# Patient Record
Sex: Female | Born: 1935
Health system: Southern US, Community
[De-identification: ages and names within clinical notes are randomized; demographics above are authoritative.]

## PROBLEM LIST (undated history)

## (undated) DIAGNOSIS — H9190 Unspecified hearing loss, unspecified ear: Secondary | ICD-10-CM

## (undated) DIAGNOSIS — E871 Hypo-osmolality and hyponatremia: Secondary | ICD-10-CM

## (undated) DIAGNOSIS — I480 Paroxysmal atrial fibrillation: Secondary | ICD-10-CM

## (undated) DIAGNOSIS — I10 Essential (primary) hypertension: Secondary | ICD-10-CM

## (undated) DIAGNOSIS — R0601 Orthopnea: Secondary | ICD-10-CM

## (undated) DIAGNOSIS — I340 Nonrheumatic mitral (valve) insufficiency: Secondary | ICD-10-CM

## (undated) DIAGNOSIS — K219 Gastro-esophageal reflux disease without esophagitis: Secondary | ICD-10-CM

## (undated) DIAGNOSIS — E785 Hyperlipidemia, unspecified: Secondary | ICD-10-CM

## (undated) DIAGNOSIS — C801 Malignant (primary) neoplasm, unspecified: Secondary | ICD-10-CM

## (undated) DIAGNOSIS — I7090 Unspecified atherosclerosis: Secondary | ICD-10-CM

## (undated) DIAGNOSIS — S42009A Fracture of unspecified part of unspecified clavicle, initial encounter for closed fracture: Secondary | ICD-10-CM

## (undated) DIAGNOSIS — R001 Bradycardia, unspecified: Secondary | ICD-10-CM

## (undated) DIAGNOSIS — J449 Chronic obstructive pulmonary disease, unspecified: Secondary | ICD-10-CM

## (undated) DIAGNOSIS — J9611 Chronic respiratory failure with hypoxia: Secondary | ICD-10-CM

## (undated) DIAGNOSIS — Z720 Tobacco use: Secondary | ICD-10-CM

## (undated) HISTORY — DX: Unspecified atherosclerosis: I70.90

## (undated) HISTORY — PX: ABDOMINAL HYSTERECTOMY: SHX81

## (undated) HISTORY — DX: Hyperlipidemia, unspecified: E78.5

## (undated) HISTORY — PX: APPENDECTOMY: SHX54

## (undated) HISTORY — PX: BACK SURGERY: SHX140

## (undated) HISTORY — DX: Essential (primary) hypertension: I10

## (undated) HISTORY — DX: Chronic obstructive pulmonary disease, unspecified: J44.9

## (undated) HISTORY — PX: VESICOVAGINAL FISTULA CLOSURE W/ TAH: SUR271

## (undated) HISTORY — DX: Tobacco use: Z72.0

## (undated) HISTORY — DX: Fracture of unspecified part of unspecified clavicle, initial encounter for closed fracture: S42.009A

## (undated) HISTORY — DX: Paroxysmal atrial fibrillation: I48.0

## (undated) HISTORY — DX: Gastro-esophageal reflux disease without esophagitis: K21.9

---

## 2003-01-22 ENCOUNTER — Ambulatory Visit: Admission: RE | Admit: 2003-01-22 | Discharge: 2003-01-22 | Payer: Self-pay | Admitting: Gynecologic Oncology

## 2003-01-22 ENCOUNTER — Encounter (INDEPENDENT_AMBULATORY_CARE_PROVIDER_SITE_OTHER): Payer: Self-pay | Admitting: Specialist

## 2003-02-27 ENCOUNTER — Encounter: Payer: Self-pay | Admitting: Gynecologic Oncology

## 2003-03-05 ENCOUNTER — Encounter (INDEPENDENT_AMBULATORY_CARE_PROVIDER_SITE_OTHER): Payer: Self-pay | Admitting: Specialist

## 2003-03-05 ENCOUNTER — Ambulatory Visit (HOSPITAL_COMMUNITY): Admission: RE | Admit: 2003-03-05 | Discharge: 2003-03-05 | Payer: Self-pay | Admitting: Gynecologic Oncology

## 2003-04-08 ENCOUNTER — Ambulatory Visit: Admission: RE | Admit: 2003-04-08 | Discharge: 2003-04-08 | Payer: Self-pay | Admitting: Gynecologic Oncology

## 2003-08-13 ENCOUNTER — Ambulatory Visit: Admission: RE | Admit: 2003-08-13 | Discharge: 2003-08-13 | Payer: Self-pay | Admitting: Gynecologic Oncology

## 2003-08-13 ENCOUNTER — Encounter (INDEPENDENT_AMBULATORY_CARE_PROVIDER_SITE_OTHER): Payer: Self-pay | Admitting: Specialist

## 2003-08-13 ENCOUNTER — Other Ambulatory Visit: Admission: RE | Admit: 2003-08-13 | Discharge: 2003-08-13 | Payer: Self-pay | Admitting: Gynecologic Oncology

## 2004-03-03 ENCOUNTER — Ambulatory Visit: Admission: RE | Admit: 2004-03-03 | Discharge: 2004-03-03 | Payer: Self-pay | Admitting: Gynecologic Oncology

## 2004-03-03 ENCOUNTER — Encounter (INDEPENDENT_AMBULATORY_CARE_PROVIDER_SITE_OTHER): Payer: Self-pay | Admitting: Specialist

## 2004-09-08 ENCOUNTER — Encounter (INDEPENDENT_AMBULATORY_CARE_PROVIDER_SITE_OTHER): Payer: Self-pay | Admitting: Specialist

## 2004-09-08 ENCOUNTER — Other Ambulatory Visit: Admission: RE | Admit: 2004-09-08 | Discharge: 2004-09-08 | Payer: Self-pay | Admitting: Gynecologic Oncology

## 2004-09-08 ENCOUNTER — Ambulatory Visit: Admission: RE | Admit: 2004-09-08 | Discharge: 2004-09-08 | Payer: Self-pay | Admitting: Gynecologic Oncology

## 2005-09-20 ENCOUNTER — Other Ambulatory Visit: Admission: RE | Admit: 2005-09-20 | Discharge: 2005-09-20 | Payer: Self-pay | Admitting: Gynecologic Oncology

## 2005-09-20 ENCOUNTER — Encounter (INDEPENDENT_AMBULATORY_CARE_PROVIDER_SITE_OTHER): Payer: Self-pay | Admitting: *Deleted

## 2005-09-20 ENCOUNTER — Ambulatory Visit: Admission: RE | Admit: 2005-09-20 | Discharge: 2005-09-20 | Payer: Self-pay | Admitting: Gynecologic Oncology

## 2006-11-08 ENCOUNTER — Ambulatory Visit: Admission: RE | Admit: 2006-11-08 | Discharge: 2006-11-08 | Payer: Self-pay | Admitting: Gynecologic Oncology

## 2006-11-08 ENCOUNTER — Other Ambulatory Visit: Admission: RE | Admit: 2006-11-08 | Discharge: 2006-11-08 | Payer: Self-pay | Admitting: Gynecologic Oncology

## 2007-02-06 ENCOUNTER — Ambulatory Visit: Payer: Self-pay | Admitting: Physician Assistant

## 2007-02-15 ENCOUNTER — Ambulatory Visit: Payer: Self-pay | Admitting: Unknown Physician Specialty

## 2007-02-15 ENCOUNTER — Other Ambulatory Visit: Payer: Self-pay

## 2007-02-19 ENCOUNTER — Inpatient Hospital Stay: Payer: Self-pay | Admitting: Unknown Physician Specialty

## 2007-02-22 ENCOUNTER — Encounter: Payer: Self-pay | Admitting: Internal Medicine

## 2007-03-20 ENCOUNTER — Encounter: Payer: Self-pay | Admitting: Internal Medicine

## 2007-04-19 ENCOUNTER — Encounter: Payer: Self-pay | Admitting: Internal Medicine

## 2007-05-20 ENCOUNTER — Encounter: Payer: Self-pay | Admitting: Internal Medicine

## 2007-06-19 ENCOUNTER — Encounter: Payer: Self-pay | Admitting: Internal Medicine

## 2007-07-20 ENCOUNTER — Encounter: Payer: Self-pay | Admitting: Internal Medicine

## 2007-08-20 ENCOUNTER — Encounter: Payer: Self-pay | Admitting: Internal Medicine

## 2007-09-19 ENCOUNTER — Encounter: Payer: Self-pay | Admitting: Internal Medicine

## 2007-10-20 ENCOUNTER — Encounter: Payer: Self-pay | Admitting: Internal Medicine

## 2007-11-19 ENCOUNTER — Encounter: Payer: Self-pay | Admitting: Internal Medicine

## 2008-05-04 IMAGING — CR DG CHEST 2V
1 series · 2 of 2 positions shown · non-contrast
Comparison: none

REASON FOR EXAM: COPD, hypertension
COMMENTS:

PROCEDURE:     DXR - DXR CHEST PA (OR AP) AND LATERAL  - February 15, 2007  [DATE]
RESULT:     The lung fields are clear. The heart, mediastinal and osseous
structures reveal no significant abnormalities.

[Series 1: view not recorded · 0.17mm/px · 2 of 2 slices shown]
[im 1/2]
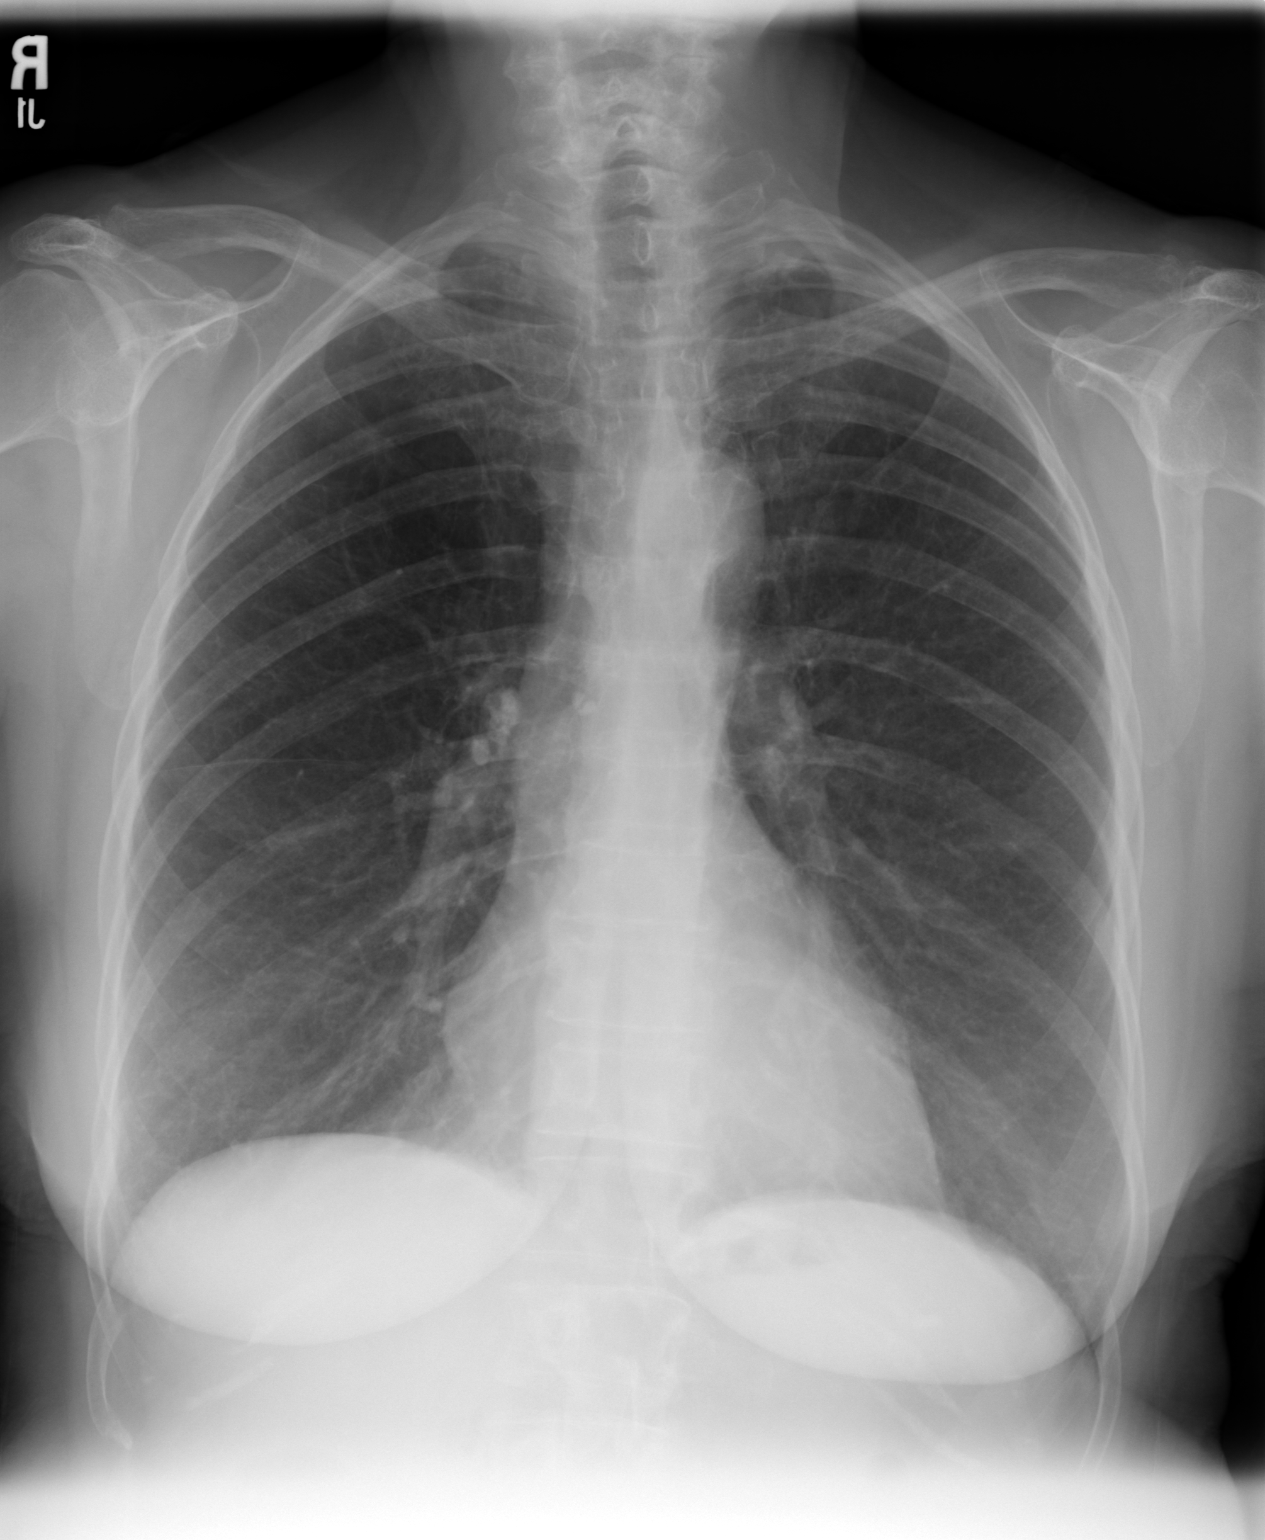
[im 2/2]
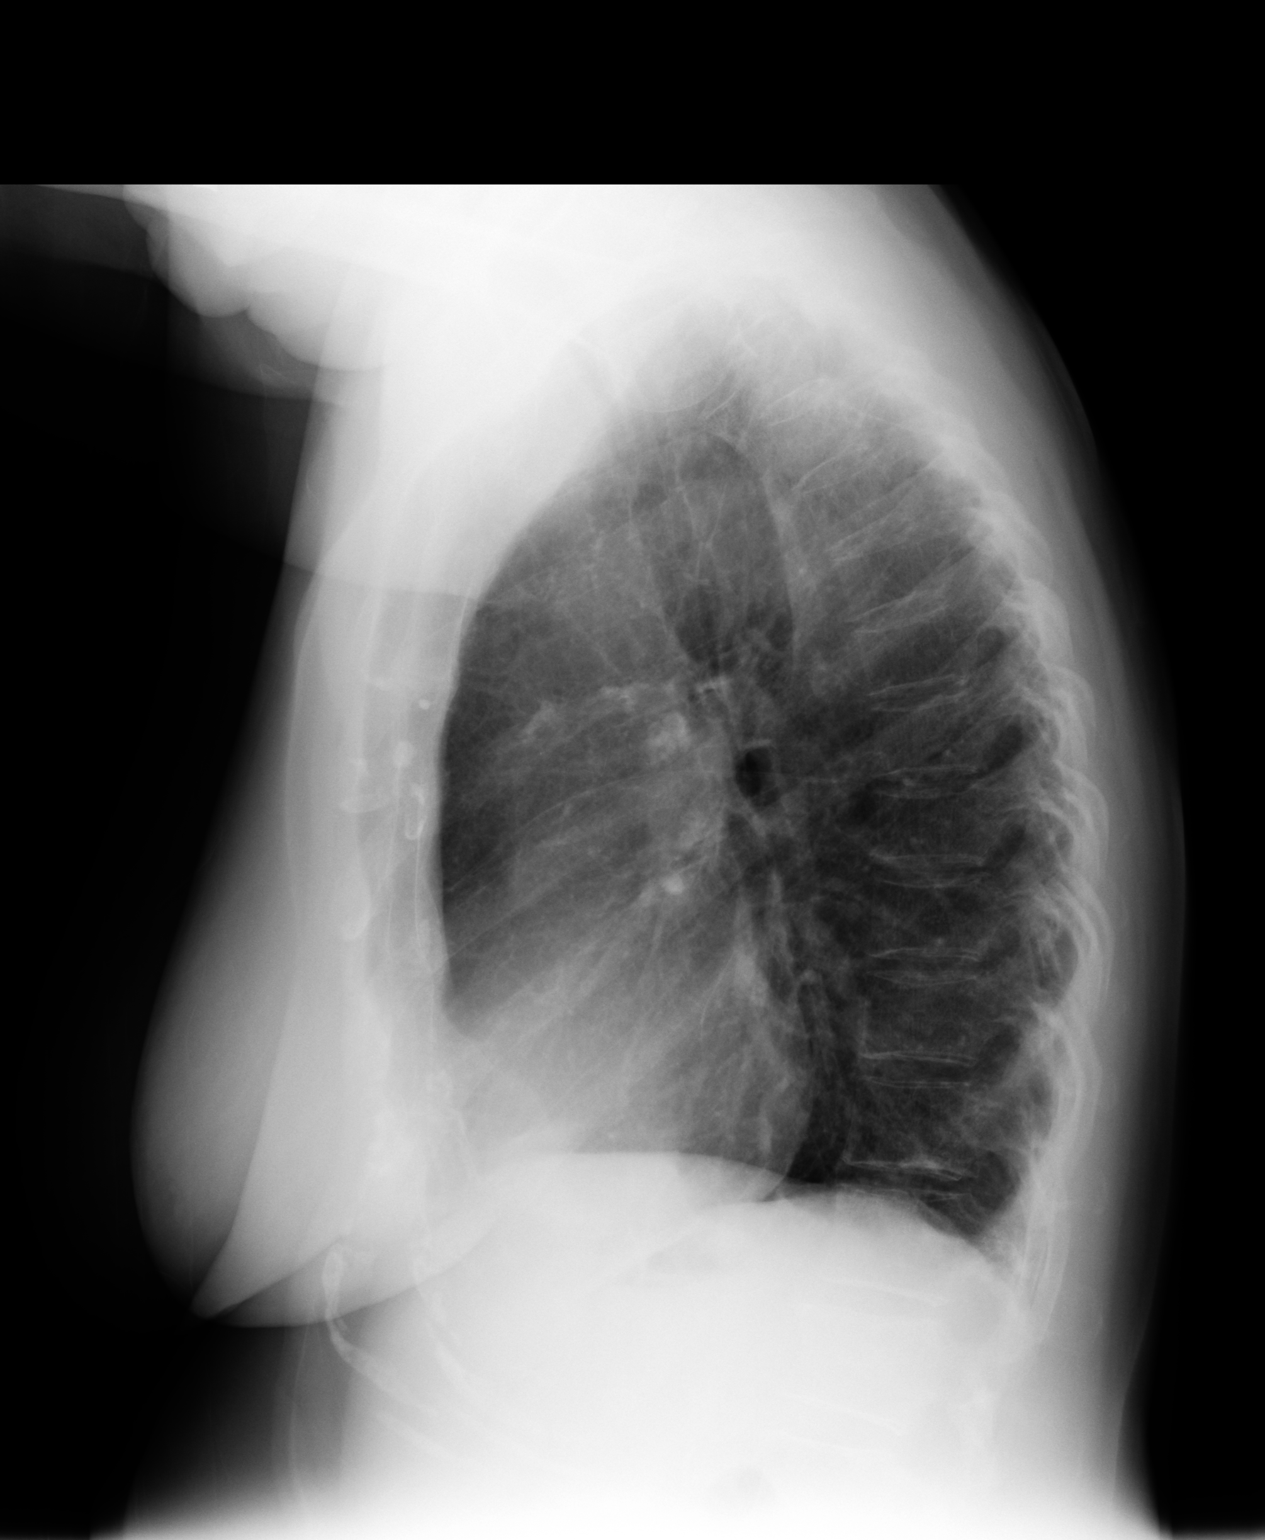

[2 of 2 positions shown; findings below may reference images not displayed]

IMPRESSION: No significant abnormalities are noted.

## 2008-09-15 ENCOUNTER — Encounter: Payer: Self-pay | Admitting: Cardiovascular Disease

## 2008-09-24 ENCOUNTER — Ambulatory Visit: Payer: Self-pay

## 2008-09-24 ENCOUNTER — Ambulatory Visit: Payer: Self-pay | Admitting: Cardiovascular Disease

## 2008-09-24 ENCOUNTER — Encounter: Payer: Self-pay | Admitting: Cardiovascular Disease

## 2011-05-03 NOTE — Assessment & Plan Note (Signed)
[facility]                            [place] OFFICE NOTE   NAME:[name], Jenna SPROWL                       MRN:          [id number]  DATE:09/24/2008                            DOB:          [birth year]    HISTORY OF PRESENT ILLNESS:  [name] is a pleasant 77 year old  Caucasian female with a past medical history significant for  hypertension, hyperlipidemia, COPD, gastroesophageal reflux disease, and  tobacco abuse who presents to our cardiology office today for cardiac  assessment following abnormal EKG.  The patient tells me that she has  been in her normal state of good health.  She denies any recent chest  pain, palpitations, diaphoresis, nausea, vomiting, dizziness, near-  syncope, syncope, paroxysmal nocturnal dyspnea, orthopnea, or lower  extremity edema.  She is very active around her house, doing all of her  housework, cooking, and all of her yard work.  She is never limited by  chest pain or shortness of breath.  She does occasionally use an  albuterol nebulizer for her COPD.  She does not require home oxygen  therapy for her COPD.  She also enjoys playing golf and states that she  can do anything she wants without being limited by symptoms of chest  pain or shortness of breath.  She was seen in Dr. Marliss Coots office last  week and had an 12-lead EKG that showed sinus bradycardia with a  ventricular rate of 50 beats per minute as well as low voltage QRS  complexes.  She is referred here today for further evaluation of her  abnormal EKG.   PAST MEDICAL HISTORY:  1. Hypertension.  2. Hyperlipidemia.  3. Gastroesophageal reflux disease.  4. COPD.  5. Osteoporosis.  6. Tobacco abuse.   PAST SURGICAL HISTORY:  1. Hysterectomy.  2. Appendectomy.  3. Back surgery x2.   ALLERGIES:  SULFA, PENICILLIN.   CURRENT MEDICATIONS:  1. Pravastatin 40 mg once daily.  2. Coreg 25 mg twice daily.  3. Omeprazole 20 mg once daily.  4.  Lisinopril/hydrochlorothiazide 10/25 mg once daily.  5. Enteric-coated aspirin 81 mg once daily.  6. Calcium 600 mg 1 tablet once daily.   SOCIAL HISTORY:  The patient has smoked 1 pack of cigarettes per day for  the last 50+ years.  She continues to smoke one-pack daily.  She denies  use of alcohol or illicit drugs.  She is widowed and has 5 children.   FAMILY HISTORY:  The patient's mother died from natural causes at the  age of 7.  Her father died at age of 46 from suicide.  She has 4  sisters, 1 of whom died at the age of 90 from a myocardial infarction.  She has a brother who has cancer.   REVIEW OF SYSTEMS:  As stated in the history of present illness and is  otherwise negative.   PHYSICAL EXAMINATION:  GENERAL:  She is a pleasant elderly Caucasian  female in no acute distress.  VITAL SIGNS:  Blood pressure 144/82, pulse 60 and regular, respiration  12 and nonlabored.  NECK:  No JVD.  No carotid bruits.  No thyromegaly, no lymphadenopathy.  SKIN:  Warm and dry.  Oropharynx clear.  Mucous membranes moist.  NEUROLOGIC:  No focal deficits.  Cranial nerves II through XII are  grossly intact.  LUNGS:  Clear to auscultation bilaterally with slightly decreased air  movement in both lung fields.  No wheezes, crackles, or rhonchi noted.  CARDIOVASCULAR:  Bradycardic with regular rhythm and no murmurs,  gallops, or rubs noted.  There are distant heart sounds.  ABDOMEN:  Soft, nontender.  Bowel sounds are present.  EXTREMITIES:  No evidence of lower extremity edema.  Pulses are 2+ in  all extremities.  The patient does have a brace on her right lower  extremity secondary to foot drop from a prior surgery.   DIAGNOSTIC STUDIES:  1. A 12-lead electrocardiogram obtained on September 15, 2008, and Dr.      Marliss Coots office shows sinus bradycardia with a ventricular rate of      50 beats per minute and low voltage QRS complexes.  There are no      ischemic changes noted on this EKG.  2. A  12-lead EKG obtained in our office today shows sinus bradycardia      with a ventricular rate of 56 beats per minute, nonspecific      intraventricular conduction delay, and normal QRS complexes with      normal R-wave progression throughout the precordial leads.  There      are no ischemic changes noted on this EKG.  All intervals are      within normal limits.   ASSESSMENT AND PLAN:  This is a pleasant 77 year old Caucasian female  with a past medical history significant for hypertension,  hyperlipidemia, tobacco abuse, COPD, and gastroesophageal reflux disease  who has been asymptomatic and presents today for further evaluation of  slightly abnormal EKG.  I think it would be reasonable at this time to  perform a surface echocardiogram to rule out a pericardial effusion or  any other cardiac abnormalities.  Her low-voltage EKG is most likely  related to her COPD.  She describes no symptoms today that are  suggestive of angina, arrhythmias, or congestive heart failure.  I would  like to see her back in my office in several weeks to review the results  of her echocardiogram.  I have  encouraged her to alert our office should she have any change in her  symptoms in the meantime.     Verne Carrow, MD  Electronically Signed    CM/MedQ  DD: 09/24/2008  DT: 09/24/2008  Job #: [id number]   cc:   Gelene Mink, MD

## 2011-05-06 NOTE — Consult Note (Signed)
   NAME:  [name], [name]                    ACCOUNT NO.:  [id number]   MEDICAL RECORD NO.:  [id number]                   PATIENT TYPE:  OUT   LOCATION:  GYN                                  FACILITY:  [facility]   PHYSICIAN:  John T. Kyla Balzarine, M.D.                 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   HISTORY OF PRESENT ILLNESS:  The patient underwent wide local excision of  left labial VIN III and laser vaporization of the upper vaginal cuff in  March 2004.  She has healed quite well and notes no new vulvar lesions,  vulvar pruritus, or discharge.   Past history, personal social history, family history, and review of systems  are otherwise unchanged from those recorded during her surgical history and  examination for her March 17 procedure.   PHYSICAL EXAMINATION:  VITAL SIGNS:  Stable and afebrile with weight 149  pounds, blood pressure 136/84.  ABDOMEN:  Soft and benign without ascites, mass, or organomegaly.  PELVIC:  External genitalia and BUS reveal good healing of her surgical  incision.  There are no active lesions suggesting dysplasia.  Speculum  examination reveals good pelvic support with no mucosal lesion and complete  healing of the vaginal cuff.  Bladder and urethra are well supported.  Cervix and uterus are surgically absent.  Bimanual and rectovaginal  examinations reveal no adnexal or parametrial mass.   ASSESSMENT:  VIN and VAIN.   PLAN:  The patient reassured regarding current status.  At this juncture I  believe that we can follow her at six month intervals with cytology and we  can alternate with Thalia Party, M.D. in [place] to spare her some  travel.                                               John T. Kyla Balzarine, M.D.    JTS/MEDQ  D:  08/13/2003  T:  08/13/2003  Job:   [id number]   cc:   Thalia Party  [address]  [place]  [place] [address]  Fax: [phone]   Telford Nab, R.N.  [address]  [place], [place] [address]

## 2011-05-06 NOTE — Op Note (Signed)
NAME:  [name], [name]                    ACCOUNT NO.:  [id number]   MEDICAL RECORD NO.:  [id number]                   PATIENT TYPE:  AMB   LOCATION:  DAY                                  FACILITY:  [facility]   PHYSICIAN:  John T. Kyla Balzarine, M.D.                 DATE OF BIRTH:  31-Dec-1932   DATE OF PROCEDURE:  03/05/2003  DATE OF DISCHARGE:                                 OPERATIVE REPORT   PREOPERATIVE DIAGNOSIS:  Vulvar carcinoma in situ, vaginal intraepithelial  neoplasia.   POSTOPERATIVE DIAGNOSIS:  Vulvar carcinoma in situ, vaginal intraepithelial  neoplasia.   PROCEDURE:  Wide local excision of left labial lesion, laser vaporization of  upper vaginal cuff.   SURGEON:  John T. Kyla Balzarine, M.D.   ANESTHESIA:  General supplemented with local infiltration of 0.25% Marcaine.   INDICATIONS:  This 77 year old woman presented with left intralabial  carcinoma in situ and a VIN-II involving the vaginal cuff.  After  consideration of options, it was elected to remove the intralabial lesion to  make sure that the patient did not have an invasive lesion at that site.   FINDINGS:  At the time of exam under anesthesia, the patient was found to  have 2 x 2.5 cm left anterior intralabial lesion, which was accentuated with  acetic acid staining, appeared relatively thin, and had no ulceration.  This  was resected with 5 mm margins, with the resection extending into the  superficial fat to ensure adequate margins if the cold invasion were  identified.  The upper vaginal cuff was vaporized to a depth of 1-2 mm after  infiltrating the upper cuff and lesion with Marcaine to float the mucosa.  This provided at least 5 mm margins around the aceto-white epithelium.   DESCRIPTION OF PROCEDURE:  The patient was prepped and draped in the  lithotomy position and bladder emptied with a straight catheter.  The left  intralabial fold lesion was injected subdermally with Marcaine and  circumscribed in an  elliptical fashion with the scalpel.  The medial margin  of resection was adjacent to the clitoris and at the base of the left labia  and anterior labia minora and laterally just into the medial labia majora.  The lesion was excised using scalpel with electrocautery for hemostasis,  sizing a thin rim of underlying fat to provide adequate margins if an  invasive lesion were encountered.  Hemostasis was ensured with  electrocautery and the defect closed in layers with interrupted 2-0 Vicryl  for subcutaneous adipose tissues and running 3-0 Monocryl for the skin.   The CO2 laser was hooked up to the culdoscope.  A speculum with smoke  evacuator was advanced into the vagina, and there was good visualization of  approximately a 2 cm patch of white epithelium in the mid cuff.  This was  slightly closer to the right vaginal dimple but did not go up into either of  the  vaginal dimples.  The lesion was injected submucosally with Marcaine to  float the lesion and vaginal mucosa.  Using the CO2 laser at a continuous  setting of 20 watts, the lesion was vaporized to a depth of approximately 1  mm.  Adjacent epithelium was likewise treated to a depth of approximately 1-  2 mm, essentially vaporizing the mucosa with a 5 mm margin around the gross  lesion.  There was minimal bleeding, and hemostasis was adequate.  The  patient was returned to the recovery room in stable condition.  Estimated  blood loss was nil.  Transfusions none.  Drains and packs none.  Sponge and  instrument counts.  Pathology:  Left labial lesion.                                               John T. Kyla Balzarine, M.D.    JTS/MEDQ  D:  03/05/2003  T:  03/06/2003  Job:  [id number]   cc:   Thalia Party  [address]  [place]  [place] [address]  Fax: [phone]   Telford Nab, R.N.

## 2011-05-06 NOTE — Consult Note (Signed)
[name], [name]                ACCOUNT NO.:  [id number]   MEDICAL RECORD NO.:  [id number]          PATIENT TYPE:  OUT   LOCATION:  GYN                          FACILITY:  [facility]   PHYSICIAN:  John T. Kyla Balzarine, M.D.    DATE OF BIRTH:  March 18, 1933   DATE OF CONSULTATION:  11/08/2006  DATE OF DISCHARGE:                                 CONSULTATION   CHIEF COMPLAINT:  Follow up of low grade vaginal dysplasia.   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  She underwent wide local excision of the left vulvar  carcinoma in situ and laser vaporization of the upper vaginal lesion.  She has had intermittent cytology suggesting a low grade SIL, and we  recommended vaginal Premarin but has not been using this recently.  Pap  smear in October 2006 and March 2007 were negative for dysplasia.  The  patient denies pelvic symptoms or vaginal bleeding.   CURRENT MEDICATIONS:  Evista, Proventil, Mobic, Aciphex   ALLERGIES:  PENICILLIN and SULFA.   PAST MEDICAL, SURGICAL, PERSONAL, SOCIAL, AND FAMILY HISTORIES:  Are  reviewed none and essentially unchanged from those obtained during my  intake evaluation in February 2004.  The patient does continue to smoke.   REVIEW OF SYSTEMS:  Other than above, negative for systemic, GI, GU for  heme/lymph systems.   PHYSICAL EXAMINATION:  VITAL SIGNS:  Stable with weight 138 pounds  (stable)  LYMPHATIC SURVEY: With no pathologic lymphadenopathy.  ABDOMEN:  Soft and benign without hernia, ascites, mass or tenderness.  EXTREMITIES:  Full range of motion without edema.  PELVIC:  External genitalia and BUS are normal.  Inspection and  palpation without condylomatous or leukoplakia.  The left partial  vulvectomy scar is well healed.  The vagina is atrophic with good  support of the bladder and rectum.  Bimanual and rectovaginal  examinations reveal  absent uterus and cervix with no adnexal or  parametrium mass or nodularity.   ASSESSMENT:  1. Persistent abnormal vaginal cytology in the past, resolved.  2. History of multifocal lower genital tract dysplasia.   PLAN:  The patient's cytology has been without evidence of dysplasia  over the last 18 months, and it has been more than 3 years since the  initial treatment.  I believe that she could be followed with annual  cytology obtained by Dr. Lacie Scotts; I would be glad to see her at any  time on a p.r.n. basis.      John T. Kyla Balzarine, M.D.  Electronically Signed     JTS/MEDQ  D:  11/08/2006  T:  11/08/2006  Job:  [id number]   cc:   Telford Nab, R.N.  [address]  [place], [place] [address]   Evelene Croon  Fax: [phone]   Phil D. Okey Dupre, M.D.

## 2011-05-06 NOTE — Consult Note (Signed)
NAME:  [name], [name]                    ACCOUNT NO.:  [id number]   MEDICAL RECORD NO.:  [id number]                   PATIENT TYPE:  OUT   LOCATION:  GYN                                  FACILITY:  [facility]   PHYSICIAN:  John T. Kyla Balzarine, M.D.                 DATE OF BIRTH:  08/17/1933   DATE OF CONSULTATION:  03/03/2004  DATE OF DISCHARGE:                                   CONSULTATION   CHIEF COMPLAINT:  Follow up of recurrent VAIN.   HISTORY OF PRESENT ILLNESS:  The patient initially presented with left  labial VIN-3 and upper vaginal moderate dysplasia diagnosed by Dr. Luella Cook  in early 2004.  On March 05, 2003, she underwent wide local excision of left  vulvar carcinoma in situ and laser vaporization of the upper vaginal lesion.  Follow up cytology recently by Dr. Luella Cook was compatible with low-grade  SIL.  There apparently was a small amount of blood at the cuff noted grossly  by Dr. Luella Cook.  She returns for further evaluation.  On questioning, the  patient denies pelvic pain or vaginal bleeding.  She is not currently using  any estrogen.   PAST MEDICAL HISTORY:  1. Osteoarthritis.  2. Osteoporosis.  3. Hypercholesterolemia.  4. She had a prior hysterectomy with USO in 1976 for fibroids.   MEDICATIONS:  Included Evista, Provencal, Mobic, and Aciphex.   ALLERGIES:  PENICILLIN and SULFA.   Otherwise Personal Social History, Family History and Review of Systems are  unchanged from those obtained in my initial evaluation in February 2004.  The patient continues to smoke.   PHYSICAL EXAMINATION:  VITAL SIGNS:  Vital signs stable with weight 147  pounds and blood pressure 140/96.  GENERAL:  The patient is alert and oriented x3 in no acute distress.  LYMPHATICS:  No pathologic lymphadenopathy.  ABDOMEN:  Soft and benign without hernia, ascites, mass, or tenderness.  EXTREMITIES:  Full range of motion without edema.  PELVIC:  External genital and BUS are normal to  inspection and palpation,  without leukoplakic lesions.  The prior left partial vulvectomy scar is well  healed.  The vagina is atrophic but there is good support of rectum and  bladder.  No gross lesions are seen and there is no evidence of bleeding.  Bimanual and rectovaginal examinations reveal absent uterus and cervix.  No  adnexal or parametrial mass or nodularity.   PROCEDURE NOTE:  After verbal informed consent was obtained, I performed  colposcopy of the entire vaginal canal.  The patient's left forniceal  dimple has been obliterated.  Arising from the right dimple is a 5 mm  acetowhite lesion with features suggesting flat condyloma with punctation  but no atypical vascularity or mosaicism.  Biopsy was obtained without  incident.   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  Prior history of multifocal lower genital tract  dysplasia.   RECOMMENDATIONS:  If today's biopsy returns compatible with VAIN-1 or  condylomata, I would recommend that the patient simply use vaginal Premarin  once weekly and return to see [place] in 6 months for follow up cytology.  If she  has stable low-grade SIL cytology, she could be followed by Dr. Luella Cook with  cytology at 6 month intervals.  I would repeat colposcopy then for worsening  grade of cytologic abnormality or symptoms.  On the other hand, if the  patient has VAIN-2 or a higher grade lesion, I would recommend upper  colpectomy to excise the forniceal dimple and adjacent mucosa.                                               John T. Kyla Balzarine, M.D.    JTS/MEDQ  D:  03/03/2004  T:  03/05/2004  Job:  [id number]   cc:   Telford Nab, R.N.  [address]  [place], [place] [address]   Thalia Party  [address]  [place]  [place] [address]  Fax: [phone]

## 2011-05-06 NOTE — Consult Note (Signed)
   NAME:  [name], [name]                    ACCOUNT NO.:  [id number]   MEDICAL RECORD NO.:  [id number]                   PATIENT TYPE:  OUT   LOCATION:  GYN                                  FACILITY:  [facility]   PHYSICIAN:  John T. Kyla Balzarine, M.D.                 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   HISTORY OF PRESENT DISEASE:  This patient underwent wide local excision of  the left labial lesion and laser vaporization of the upper vaginal cuff on  March 05, 2003.  She has had cessation of vaginal discharge and her vulvar  lesion has healed well.  Final pathology revealed squamous carcinoma in situ  of the vulva with focally positive margins.   Past history, personal social history, family history, and review of systems  are otherwise unchanged from those recorded during her surgical history and  examination for her March 17 procedure.   PHYSICAL EXAMINATION:  VITAL SIGNS:  Blood pressure 120/70.  PELVIC:  External genitalia and BUS revealed good healing of her surgical  incision.  There are no active lesions suggesting dysplasia.  Speculum  examination reveals complete healing of the vaginal cuff.  Bladder and  urethra well supported.  Cervix and uterus are surgically absent.  Bimanual  and rectovaginal examinations reveal no adnexal or parametrial mass.   ASSESSMENT:  VIN and VAIN.   PLAN:  The patient reassured regarding current status.  I would like to see  her back for follow-up in four to six months and then she should be followed  with q.6 month cytology for several years and at least annual cytology for  lifetime.  To spare her travel she could be followed in the future by  Thalia Party, M.D. after we have documented that we have cleared her  dysplastic process.        John T. Kyla Balzarine, M.D.    JTS/MEDQ  D:  04/08/2003  T:  04/08/2003  Job:  [id number]   cc:   Thalia Party  [address]  [place]  [place] [address]  Fax: [phone]   Telford Nab, R.N.

## 2011-05-06 NOTE — Consult Note (Signed)
NAMECARLISA, [name]                ACCOUNT NO.:  [id number]   MEDICAL RECORD NO.:  [id number]          PATIENT TYPE:  OUT   LOCATION:  GYN                          FACILITY:  [facility]   PHYSICIAN:  John T. Kyla Balzarine, M.D.    DATE OF BIRTH:  06-03-1933   DATE OF CONSULTATION:  09/08/2004  DATE OF DISCHARGE:                                   CONSULTATION   CHIEF COMPLAINT:  Follow up of low-grade vaginal dysplasia.   INTERVAL HISTORY:  Vaginal cuff biopsy via colposcopic direction on  March 03, 2004, revealed slight squamous dysplasia. She has been using vaginal  Premarin every week and relates no new lesions or symptoms.   HISTORY OF PRESENT ILLNESS:  The patient initially presented with a left  labial VIM-III and upper vaginal moderate dysplasia in early 2004. In March  2004 she underwent wide local excision of left bulbar carcinoma in situ and  laser vaporization of the upper vaginal lesion. Follow-up cytology was  compatible with low-grade SIL with slight dysplasia of the vaginal cuff  diagnosed in March 2005. We elected treatment with estrogen without  intervention.   PAST MEDICAL HISTORY:  Osteoarthritis, osteoporosis, hypercholesterolemia.   PAST SURGICAL HISTORY:  Prior hysterectomy with USO in 1976 for fibroids.   MEDICATIONS:  Evista, Proventil, Mobic, Aciphex, and vaginal Premarin.   ALLERGIES:  PENICILLIN and SULFA.   PERSONAL/SOCIAL HISTORY, FAMILY HISTORY, REVIEW OF SYSTEMS:  Unchanged from  those obtained on my initial intake evaluation in February 2004. The patient  continues to smoke.   PHYSICAL EXAMINATION:  VITAL SIGNS: Weight 149 pounds, blood pressure  150/88, and the patient is afebrile.  GENERAL: The patient is alert and oriented times three, and in no acute  distress.  LYMPHS: No pathology lymphadenopathy.  ABDOMEN: Soft and benign without hernia, ascites, masses, or tenderness.  EXTREMITIES: Full range of motion without edema.  PELVIC EXAM: External  genitalia and BUS are normal to inspection and  palpation without condylomatous or leukoplakic lesions. Left partial  vulvectomy scar is well healed. Vagina is atrophic with good support of  bladder and rectum. Bimanual and rectovaginal examinations reveal absent  uterus and cervix. No adnexal or parametrial mass/nodularity.   ASSESSMENT:  Persistent low-grade vaginal intraepithelial neoplasia. Prior  multifocal lower genital tract dysplasia.   PLAN:  The patient will continue Premarin. Pap smear obtained: If low-grade  SIL changes only, the patient can be followed by Dr. Luella Cook at six month  intervals. I would certainly be glade to see her back at any time if there  were concerns regarding the degree of dysplasia.     John   JTS/MEDQ  D:  09/08/2004  T:  09/09/2004  Job:  [id number]   cc:   Telford Nab, R.N.  [address]  [place], [place] [address]   Thalia Party  [address]  [place]  [place] [address]  Fax: [phone]

## 2011-05-06 NOTE — Consult Note (Signed)
NAME:  [name], [name]                    ACCOUNT NO.:  [id number]   MEDICAL RECORD NO.:  [id number]                   PATIENT TYPE:  OUT   LOCATION:  GYN                                  FACILITY:  [facility]   PHYSICIAN:  John T. Kyla Balzarine, M.D.                 DATE OF BIRTH:  [birth year]   DATE OF CONSULTATION:  01/22/2003  DATE OF DISCHARGE:                                   CONSULTATION   CHIEF COMPLAINT:  This 77 year old woman is seen at the request of Dr.  Patton Salles for evaluation of vaginal dysplasia.   HISTORY OF PRESENT ILLNESS:  The patient was noted to have a white lesion of  the upper vagina by her family physician, and was referred to Dr. Patton Salles  for evaluation.  She has apparently had frequent cytology and denies  abnormalities.  The patient was thoroughly evaluated by Dr. Patton Salles,  including colposcopically directed biopsy of upper vaginal lesion which  returned positive for [facility] with HPD changes.  Slides were reviewed at  [facility], and moderate dysplasia was thought to be a focal  change.  The patient denies prior history of vaginal or vulvar condylomata  or STD's.   PAST MEDICAL HISTORY:  1. Osteoarthritis.  2. Osteoporosis.  3. Hypercholesterolemia.  4. Status post total abdominal hysterectomy with unilateral salpingo-     oophorectomy in 1976, for fibroids and dysfunctional bleeding.   MEDICATIONS:  1. Evista.  2. Provencal.  3. Mobic.  4. Aciphex.   ALLERGIES:  1. PENICILLIN.  2. SULFA.   SOCIAL HISTORY:  The patient admits one pack per day tobacco use for more  then 40 years.  Denies significant ethanol use.   FAMILY HISTORY:  Significant for adult onset diabetes, and a sister with  breast cancer.   REVIEW OF SYMPTOMS:  The patient denies systemic symptoms such as weight  loss or anorexia.  She denies cardiopulmonary symptoms such as dyspnea or  chest pain, but does note a smoker's cough.  GASTROINTESTINAL:  Gastroesophageal reflux  disease, mild to moderate.  Denies change in bowel  or bladder function.  GENITOURINARY:  Denies hematuria, frequent urinary  tract infections, urgency, or urinary loss.  OB/GYN:  As per HPI.   PHYSICAL EXAMINATION:  VITAL SIGNS:  Weight 155 pounds, blood pressure  160/90, respirations 22, afebrile.  GENERAL:  The patient is anxious, alert and oriented x3, in no acute  distress.  HEENT:  Benign with clear oropharynx.  LUNGS:  Lung fields are clear.  HEART:  Regular rhythm without JVD.  ABDOMEN:  Soft and benign with well-healed incision without hernia, ascites,  mass, or organomegaly.  EXTREMITIES:  Full strength and range of motion without edema.  PELVIC:  External genitalia are significant for a 1.5 x 2 cm white lesion in  the anterior left intralabial fold, adjacent to the cervix.  There is a  slightly raised border.  No other pigmented lesions  are seen, including  following inspection of the vulva and perianal regions with acidic acid  staining.  The vagina has good supported bladder and urethra.  A  hyperkeratotic lesion is present in the right fornix.  Healing biopsy site  is grossly visible.  Cervix and uterus are surgically absent.  Bimanual and  rectovaginal examinations reveal no mucosal thickening, adnexal or  parametrial mass.  Rectal examination is confirmatory.   PROCEDURE NOTE:  After verbal informed consent was obtained, the leading  edge of the vulvar lesion was infiltrated with 1% Xylocaine and biopsied  with an Eppendorf.  Hemostasis was achieved with silver nitrate.  The  patient tolerated the procedure quite well.   ASSESSMENT:  1. Moderate vaginal dysplasia.  2. White vulvar lesion, concerning for vulvar dysplasia.   PLAN:  I had a long discussion with the patient regarding the nature of her  disease and the potential need for treatment.  If her vulvar biopsy returns  having non-specific change, I would simply treat her with vaginal Estrogen  and repeat an  evaluation in approximately three months.  On the other hand,  if she has significant vulvar dysplasia, she would undergo a day surgery  excision of the vulvar lesion combined with laser vaporization of the upper  vaginal lesion.  We discussed risks and benefits of this approach, and I  answered questions posed by the patient.  Surgery is tentatively scheduled  for 03/05/03, pending the results of biopsy.                                                John T. Kyla Balzarine, M.D.    JTS/MEDQ  D:  01/22/2003  T:  01/22/2003  Job:  [id number]   cc:   Ashley Akin, M.D.  [facility]  [address]  [place], [place]  [address]   Telford Nab, R.N.  [address] [place], [place] [address]  Fax: 1

## 2011-05-06 NOTE — Consult Note (Signed)
[name], [name]                ACCOUNT NO.:  [id number]   MEDICAL RECORD NO.:  [id number]          PATIENT TYPE:  OUT   LOCATION:  GYN                          FACILITY:  [facility]   PHYSICIAN:  John T. Kyla Balzarine, M.D.    DATE OF BIRTH:  04-06-1933   DATE OF CONSULTATION:  09/20/2005  DATE OF DISCHARGE:                                   CONSULTATION   CHIEF COMPLAINT:  Follow up with low-grade vaginal dysplasia.   INTERVAL HISTORY:  Vaginal cuff biopsy in May 2005 was negative for  significant dysplasia and a follow up Pap smear revealed only scattered  atypical cells of undetermined significance.  We recommended continued  follow up at six month intervals.   HISTORY OF PRESENT ILLNESS:  The patient presented with left labial vulvar  intraepithelial neoplasia VIN-3 and upper vaginal moderate dysplasia in  early 2004.  She underwent wide local excision of the left vulvar carcinoma  in situ and laser vaporization of the upper vaginal lesion.  She has had  intermittent cytology suggesting low-grade SIL.  She is not using vaginal  Premarin with any frequency.   PAST MEDICAL/SURGICAL/PERSONAL SOCIAL, FAMILY HISTORY:  Reviewed and  unchanged from those obtained in my intake evaluation in February 2004.  The  patient continues to smoke.   CURRENT MEDICATIONS:  1.  Evista.  2.  Proventil.  3.  Mobic.  4.  Aciphex.   ALLERGIES:  PENICILLIN, SULFA.   REVIEW OF SYSTEMS:  Other than above, comprehensive 10-point review of  systems negative.   PHYSICAL EXAMINATION:  ABDOMEN:  Soft and benign without hernia, ascites,  mass or tenderness.  EXTREMITIES:  Full range of motion without edema.  LYMPHATIC:  Survey with no pathological lymphadenopathy.  PELVIC:  External genitalia and BUS are normal with inspection and palpation  without condylomas or leukoplakic lesions.  The left partial vulvectomy scar  is well healed.  The vagina is atrophic with good supportive bladder and  rectum.   Bimanual and rectovaginal examination revealed adequate uterus and  cervix with no adnexal or parametrial mass/nodularity.   ASSESSMENT:  Persistent abnormal vaginal cytology.  Prior multifocal lower  genital tract dysplasia.   PLAN:  Pap smear obtained and will be communicated to the patient.  I did  recommend that she continue her vaginal Premarin.  Because of  transportation, the patient wishes cytology to be obtained in [facility] by Dr.  Lacie [name].  This could be performed by him or by Dr. Luella [name] in six months,  and we could see her back for follow up in one year.  I would not repeat  colposcopy unless she had changes of progressive dysplasia on cytology.      John T. Kyla Balzarine, M.D.  Electronically Signed     JTS/MEDQ  D:  09/20/2005  T:  09/21/2005  Job:  [id number]   cc:   Thalia Party  Fax: [phone]  Fax: [phone]   Telford Nab, R.N.  [address]  [place], [place] [address]

## 2011-06-03 ENCOUNTER — Encounter: Payer: Self-pay | Admitting: Cardiovascular Disease

## 2014-01-14 ENCOUNTER — Encounter: Payer: Self-pay | Admitting: Cardiology

## 2014-01-14 ENCOUNTER — Ambulatory Visit (INDEPENDENT_AMBULATORY_CARE_PROVIDER_SITE_OTHER): Payer: Medicare Other | Admitting: Cardiology

## 2014-01-14 VITALS — BP 152/82 | HR 47 | Ht 62.0 in | Wt 109.8 lb

## 2014-01-14 DIAGNOSIS — I10 Essential (primary) hypertension: Secondary | ICD-10-CM

## 2014-01-14 DIAGNOSIS — I498 Other specified cardiac arrhythmias: Secondary | ICD-10-CM

## 2014-01-14 DIAGNOSIS — E785 Hyperlipidemia, unspecified: Secondary | ICD-10-CM

## 2014-01-14 DIAGNOSIS — R0609 Other forms of dyspnea: Secondary | ICD-10-CM

## 2014-01-14 DIAGNOSIS — R0989 Other specified symptoms and signs involving the circulatory and respiratory systems: Secondary | ICD-10-CM

## 2014-01-14 DIAGNOSIS — R001 Bradycardia, unspecified: Secondary | ICD-10-CM

## 2014-01-14 DIAGNOSIS — R9431 Abnormal electrocardiogram [ECG] [EKG]: Secondary | ICD-10-CM

## 2014-01-14 NOTE — Patient Instructions (Addendum)
Your physician has requested that you have an echocardiogram. Echocardiography is a painless test that uses sound waves to create images of your heart. It provides your doctor with information about the size and shape of your heart and how well your heart's chambers and valves are working. This procedure takes approximately one hour. There are no restrictions for this procedure.  Your physician has recommended that you wear an event monitor. Event monitors are medical devices that record the heart's electrical activity. Doctors most often [place] these monitors to diagnose arrhythmias. Arrhythmias are problems with the speed or rhythm of the heartbeat. The monitor is a small, portable device. You can wear one while you do your normal daily activities. This is usually used to diagnose what is causing palpitations/syncope (passing out).  Once you receive the monitor, take your regular dose of carvedilol (25mg  twice a day) for 2 days, then cut in half (12.5 mg twice a day).  Your physician recommends that you schedule a follow-up appointment in: 3 weeks.

## 2014-01-17 ENCOUNTER — Encounter: Payer: Self-pay | Admitting: Cardiology

## 2014-01-17 DIAGNOSIS — E785 Hyperlipidemia, unspecified: Secondary | ICD-10-CM | POA: Insufficient documentation

## 2014-01-17 DIAGNOSIS — R9431 Abnormal electrocardiogram [ECG] [EKG]: Secondary | ICD-10-CM | POA: Insufficient documentation

## 2014-01-17 DIAGNOSIS — R0609 Other forms of dyspnea: Secondary | ICD-10-CM

## 2014-01-17 DIAGNOSIS — R001 Bradycardia, unspecified: Secondary | ICD-10-CM | POA: Insufficient documentation

## 2014-01-17 DIAGNOSIS — I10 Essential (primary) hypertension: Secondary | ICD-10-CM | POA: Insufficient documentation

## 2014-01-17 NOTE — Assessment & Plan Note (Signed)
Borderline 2 inadequate control of her blood pressures with current regimen. As on a cutting back to carvedilol we probably will need to change her regimen to add another antihypertensive agent. We can address this with you back in followup. Would anticipate using either an ACE inhibitor or calcium channel blocker to avoid potential complications of diuretic.

## 2014-01-17 NOTE — Assessment & Plan Note (Signed)
Relatively asymptomatic bradycardia, however he could question what suddenly become exertional dyspnea is related to, partially related to lung concerns with a smoker smoker but also could be related to bradycardia.  Plan: Cut carvedilol dose in half to 12.5 twice a day  2 week cardiac event monitor to evaluate heart rate range. She will last night and cut in her beta blocker dose after the first 3 days. This will also determine the change in heart rates would be to decrease beta blocker.

## 2014-01-17 NOTE — Assessment & Plan Note (Signed)
Quite well controlled on pravastatin. She seems to be close to goal.

## 2014-01-17 NOTE — Progress Notes (Signed)
PATIENT: [name] MRN: [id number] DOB: [birth year] PCP: [name], MD  Clinic Note: Chief Complaint  Patient presents with  . other    Ref by Dr. Brunetta Genera for abnormal EKG. Patient c/o pounding in her chest about 1 week ago. Medications reviewed by the patient verbally.    HPI: [name] is a 80 y.o. female with a PMH below who presents today for cardiology evaluation for bradycardia.  Interval History: Ms. [name] is here today, really with no significant symptoms. The main pain she describes is pounding heavy beating sensation of her heart. She does note occasional exertional dyspnea if she does more than a flight of stairs or walks up a hill. No resting dyspnea, but does note occasional wheezing. No chest pain with rest or exertion. No PND, orthopnea or edema. No irregular or rapid heart rates, lightheadedness, dizziness, weakness or syncope/near syncope. No TIA/amaurosis fugax symptoms. No melena, hematochezia hematuria.  Past Medical History  Diagnosis Date  . Hypertension   . Hyperlipidemia   . Gastroesophageal reflux disease   . COPD (chronic obstructive pulmonary disease)   . Osteoporosis   . Tobacco abuse   . Generalized and unspecified atherosclerosis   . Cardiac dysrhythmia, unspecified     Bradycardia  . Clavicular fracture     History of     Prior Cardiac Evaluation and Past Surgical History: Past Surgical History  Procedure Laterality Date  . Vesicovaginal fistula closure w/ tah  20+ years ago  . Appendectomy  20+ years ago  . Back surgery  2 years ago    x2  . Abdominal hysterectomy      Allergies  Allergen Reactions  . Penicillins   . Sulfamethizole     Current Outpatient Prescriptions  Medication Sig Dispense Refill  . alendronate (FOSAMAX) 70 MG tablet Take 70 mg by mouth once a week. Take with a full glass of water on an empty stomach.      . carvedilol (COREG) 25 MG tablet Take 25 mg by mouth 2 (two) times daily with a meal.       . omeprazole (PRILOSEC) 20 MG capsule Take 20 mg by mouth daily.      Marland Kitchen oxybutynin (DITROPAN-XL) 5 MG 24 hr tablet Take 5 mg by mouth at bedtime.      . pravastatin (PRAVACHOL) 40 MG tablet Take 40 mg by mouth daily.       No current facility-administered medications for this visit.    History   Social History Narrative   She is a widowed mother of 36.     Current every day smoker - ~1 ppd x ~50 yrs   Does not drink EtOH.   Active - but no routine exercise.    family history includes Cancer in her brother; Heart attack in her sister.  ROS: A comprehensive Review of Systems - Essentially negative not noted above.  PHYSICAL EXAM BP 152/82  Pulse 47  Ht 5\' 2"  (1.575 m)  Wt 109 lb 12 oz (49.782 kg)  BMI 20.07 kg/m2 General appearance: alert, cooperative, appears stated age, no distress and Pleasant mood and affect. Well-groomed and well-nourished. Answers questions appropriately. HEENT: [place]/AT, EOMI, MMM, anicteric sclera Neck: no adenopathy, no carotid bruit, no JVD and supple, symmetrical, trachea midline Lungs: rales Diffuse mild expiratory wheezing with increased AP diameter and expiratory phase. There is a bucket-handle breathing, but no increased worker breathing. No rhonchi or rales. Heart: Bradycardic with a regular rhythm. Mildly split S2. Otherwise no  M./R./G. Abdomen: soft, non-tender; bowel sounds normal; no masses,  no organomegaly Extremities: extremities normal, atraumatic, no cyanosis or edema and Mild venous stasis changes. Only minimal varicosities. Pulses: Palpable 1+ large cardiac pulses bilaterally. Skin: Skin color, texture, turgor normal. No rashes or lesions Neurologic: Grossly normal; CN 2-12 grossly intact. Normal muscle strength.  BOF:BPZWCHENI today: Yes Rate: 47, Rhythm: Sinus Bradycardia; Left Atrial Enlargement, Incomplete Right Bundle Branch Block. From PCP Sinus Bradycardia 51 beats a minute. Left Atrial Enlargement and Incomplete Right Bundle  Block.  Recent Labs: From PCP date 09/16/2013  CBC normal. BUN 25, creatinine 1.55. GFR estimated 30 to (CKD 3); other chemistries normal. TSH 1.47, T4 and free thyroxine index slightly low at 3.7, and 1.0. T3 uptake 27  Total cholesterol 172, triglycerides 151, HDL 43, LDL 99.    ASSESSMENT / PLAN: Otherwise relatively healthy elderly woman with hypertension, relatively well controlled lipids hypothyroidism and now with bradycardia. She is on a high dose of carvedilol for her hypertension which could explain her bradycardia.  Bradycardia Relatively asymptomatic bradycardia, however he could question what suddenly become exertional dyspnea is related to, partially related to lung concerns with a smoker smoker but also could be related to bradycardia.  Plan: Cut carvedilol dose in half to 12.5 twice a day  2 week cardiac event monitor to evaluate heart rate range. She will last night and cut in her beta blocker dose after the first 3 days. This will also determine the change in heart rates would be to decrease beta blocker.  DOE (dyspnea on exertion) Likely multifactorial, and I would like to see this and the potential cardiac etiology such as cystoscopy and diastolic heart failure. Plan: 2-D echocardiogram.  Hypertension Borderline 2 inadequate control of her blood pressures with current regimen. As on a cutting back to carvedilol we probably will need to change her regimen to add another antihypertensive agent. We can address this with you back in followup. Would anticipate using either an ACE inhibitor or calcium channel blocker to avoid potential complications of diuretic.  Hyperlipidemia Quite well controlled on pravastatin. She seems to be close to goal.    Orders Placed This Encounter  Procedures  . EKG 12-Lead  . Cardiac event monitor    Standing Status: Future     Number of Occurrences:      Standing Expiration Date: 01/14/2015    Scheduling Instructions:     2 week  event monitor thur eCardio to be mailed to pt's home    Order Specific Question:  Where should this test be performed    Answer:  [facility]  . 2D Echocardiogram without contrast    Standing Status: Future     Number of Occurrences:      Standing Expiration Date: 01/14/2015    Order Specific Question:  Type of Echo    Answer:  Complete    Order Specific Question:  Where should this test be performed    Answer:  [facility]    Order Specific Question:  Reason for exam-Echo    Answer:  Dyspnea  786.09   No orders of the defined types were placed in this encounter.    Followup: 3 weeks  Dorrie Cocuzza W. Ellyn Hack, M.D., M.S. [facility] [address] [place], [place]  [address]  [phone] Pager # [phone]

## 2014-01-17 NOTE — Assessment & Plan Note (Signed)
Likely multifactorial, and I would like to see this and the potential cardiac etiology such as cystoscopy and diastolic heart failure. Plan: 2-D echocardiogram.

## 2014-01-19 HISTORY — PX: OTHER SURGICAL HISTORY: SHX169

## 2014-01-21 ENCOUNTER — Other Ambulatory Visit (INDEPENDENT_AMBULATORY_CARE_PROVIDER_SITE_OTHER): Payer: Medicare Other

## 2014-01-21 DIAGNOSIS — R0989 Other specified symptoms and signs involving the circulatory and respiratory systems: Secondary | ICD-10-CM

## 2014-01-21 DIAGNOSIS — R9431 Abnormal electrocardiogram [ECG] [EKG]: Secondary | ICD-10-CM

## 2014-01-21 DIAGNOSIS — R001 Bradycardia, unspecified: Secondary | ICD-10-CM

## 2014-01-21 DIAGNOSIS — R0609 Other forms of dyspnea: Secondary | ICD-10-CM

## 2014-01-21 HISTORY — PX: TRANSTHORACIC ECHOCARDIOGRAM: SHX275

## 2014-02-05 ENCOUNTER — Ambulatory Visit (INDEPENDENT_AMBULATORY_CARE_PROVIDER_SITE_OTHER): Payer: Medicare Other | Admitting: Cardiology

## 2014-02-05 ENCOUNTER — Encounter: Payer: Self-pay | Admitting: Cardiology

## 2014-02-05 VITALS — BP 164/105 | HR 61 | Ht 62.0 in | Wt 109.5 lb

## 2014-02-05 DIAGNOSIS — I498 Other specified cardiac arrhythmias: Secondary | ICD-10-CM

## 2014-02-05 DIAGNOSIS — I10 Essential (primary) hypertension: Secondary | ICD-10-CM

## 2014-02-05 DIAGNOSIS — Z720 Tobacco use: Secondary | ICD-10-CM

## 2014-02-05 DIAGNOSIS — R001 Bradycardia, unspecified: Secondary | ICD-10-CM

## 2014-02-05 DIAGNOSIS — R0609 Other forms of dyspnea: Secondary | ICD-10-CM

## 2014-02-05 DIAGNOSIS — F17200 Nicotine dependence, unspecified, uncomplicated: Secondary | ICD-10-CM

## 2014-02-05 DIAGNOSIS — R0989 Other specified symptoms and signs involving the circulatory and respiratory systems: Secondary | ICD-10-CM

## 2014-02-05 DIAGNOSIS — E785 Hyperlipidemia, unspecified: Secondary | ICD-10-CM

## 2014-02-05 MED ORDER — LOSARTAN POTASSIUM 25 MG PO TABS: 25.0000 mg | ORAL_TABLET | Freq: Every day | ORAL | Status: DC

## 2014-02-05 MED ORDER — CARVEDILOL 12.5 MG PO TABS: 12.5000 mg | ORAL_TABLET | Freq: Two times a day (BID) | ORAL | Status: DC

## 2014-02-05 NOTE — Patient Instructions (Addendum)
Your Echocardiogram was essentially normal.  Your monitor showed a Heart Rate between ~48-80 beats per minute -- so we should keep your current BP medication at the current dose, but will change the prescription so that you no longer have to cut the tablets in half.  With that change, your blood pressure is now a bit too high.  I will need to add a new medication -- Losartan 25 mg to the new dose of Carvedilol 12.5 mg 2 x daily  I will see you back in ~6 months.  Leonie Man, MD

## 2014-02-05 NOTE — Progress Notes (Addendum)
PATIENT: [name] MRN: [id number] DOB: 02-03-1933 PCP: [name], MD  Clinic Note: Chief Complaint  Patient presents with  . Other    F/u from echo and ecardio no complaints today. Meds reviewed verbally with pt.   HPI: [name] is a 80 y.o. female with a PMH below who presents today for followup cardiology evaluation for bradycardia. She wore an event monitor for 2 weeks, and showed mostly heart rates between 46 to roughly 80 beats per minute. Apparently one or 2 short pauses that were more of blocked PACs.  Following her initial visit, I backed off on her carvedilol to 12.5 mg twice a day from 25 twice a day and  Interval History:  He denies any further symptoms of pounding in her chest or pounding heartbeats. Still a little baseline exertional dyspnea, that for her is chronic. Otherwise no major cardiac symptoms: No chest pain with rest or exertion. No PND, orthopnea or edema. No irregular or rapid heart rates, lightheadedness, dizziness, weakness or syncope/near syncope. No TIA/amaurosis fugax symptoms. No melena, hematochezia hematuria.  Past Medical History  Diagnosis Date  . Hypertension   . Hyperlipidemia   . Gastroesophageal reflux disease   . COPD (chronic obstructive pulmonary disease)   . Osteoporosis   . Tobacco abuse   . Generalized and unspecified atherosclerosis   . Cardiac dysrhythmia, unspecified     Bradycardia  . Clavicular fracture     History of    Prior Cardiac Evaluation and Past Surgical History: Past Surgical History  Procedure Laterality Date  . Vesicovaginal fistula closure w/ tah  20+ years ago  . Appendectomy  20+ years ago  . Back surgery  2 years ago    x2  . Abdominal hysterectomy    . Transthoracic echocardiogram  01/21/2014    Normal LV size and function. No regional wall motion abnormalities. Mild aortic sclerosis with trivial regurgitation.  . Cardiac event monitor  February 2015    Sinus rhythm sinus bradycardia  rates of 46-80 beats a minute. No other arrhythmias; occasional PACs with some being blocked., Occasional first-degree AV block.    Allergies  Allergen Reactions  . Penicillins   . Sulfamethizole     Current Outpatient Prescriptions  Medication Sig Dispense Refill  . alendronate (FOSAMAX) 70 MG tablet Take 70 mg by mouth once a week. Take with a full glass of water on an empty stomach.      Marland Kitchen omeprazole (PRILOSEC) 20 MG capsule Take 20 mg by mouth daily.      Marland Kitchen oxybutynin (DITROPAN-XL) 5 MG 24 hr tablet Take 5 mg by mouth at bedtime.      . pravastatin (PRAVACHOL) 40 MG tablet Take 40 mg by mouth daily.      . carvedilol (COREG) 12.5 MG tablet Take 1 tablet (12.5 mg total) by mouth 2 (two) times daily.  90 tablet  3   No current facility-administered medications for this visit.    History   Social History Narrative   She is a widowed mother of 65.     Current every day smoker - ~1 ppd x ~50 yrs   Does not drink EtOH.   Active - but no routine exercise.   ROS: A comprehensive Review of Systems - Essentially negative not noted above.  PHYSICAL EXAM BP 164/105  Pulse 61  Ht 5\' 2"  (1.575 m)  Wt 49.669 kg (109 lb 8 oz)  BMI 20.02 kg/m2 General appearance: alert, cooperative, appears stated  age, no distress and Pleasant mood and affect. Well-groomed and well-nourished. Answers questions appropriately. HEENT: [place]/AT, EOMI, MMM, anicteric sclera Neck: no adenopathy, no carotid bruit, no JVD and supple, symmetrical, trachea midline Lungs: rales Diffuse mild expiratory wheezing with increased AP diameter and expiratory phase. There is a bucket-handle breathing, but no increased worker breathing. No rhonchi or rales. Heart: Bradycardic with a regular rhythm. Mildly split S2. Otherwise no M./R./G. Abdomen: soft, non-tender; bowel sounds normal; no masses,  no organomegaly Extremities: extremities normal, atraumatic, no cyanosis or edema and Mild venous stasis changes. Only minimal  varicosities. Pulses: Palpable 1+ large cardiac pulses bilaterally. Skin: Skin color, texture, turgor normal. No rashes or lesions Neurologic: Grossly normal; CN 2-12 grossly intact. Normal muscle strength.  JQB:HALPFXTKW today: Yes Rate: 47, Rhythm: Sinus Bradycardia; Left Atrial Enlargement, Incomplete Right Bundle Branch Block. From PCP Sinus Bradycardia 51 beats a minute. Left Atrial Enlargement and Incomplete Right Bundle Block.  Recent Labs: From PCP date 09/16/2013  CBC normal. BUN 25, creatinine 1.55. GFR estimated 30 to (CKD 3); other chemistries normal. TSH 1.47, T4 and free thyroxine index slightly low at 3.7, and 1.0. T3 uptake 27  Total cholesterol 172, triglycerides 151, HDL 43, LDL 99.    ASSESSMENT / PLAN: Otherwise relatively healthy elderly woman with hypertension, relatively well controlled lipids hypothyroidism and now with bradycardia. She is on a high dose of carvedilol for her hypertension which could explain her bradycardia.  Bradycardia A. symptomatic bradycardia on high dose beta blocker. I backed off on her carvedilol 12.5 mg twice a day, and her heart rates have improved. For now, possibly monitor on lower dose of beta blocker. She was relatively asymptomatic in the past, and monitor did not show any severe bradycardia.  Hypertension With backing off on the carvedilol, she is clearly hypertensive. I am adding an ARB.  Tobacco abuse 5 minutes of the visit was spent with smoking cessation counseling. She seemed to acknowledge my comments, but did not seem to be in the contemplative stage.  DOE (dyspnea on exertion) No echocardiographic suggestion of diastolic dysfunction, normal systolic function. No clear vascular lesions.  Hyperlipidemia On statin. Monitored by PCP   No orders of the defined types were placed in this encounter.   Meds ordered this encounter  Medications  . carvedilol (COREG) 12.5 MG tablet    Sig: Take 1 tablet (12.5 mg total) by  mouth 2 (two) times daily.    Dispense:  90 tablet    Refill:  3  . losartan (COZAAR) 25 MG tablet    Sig: Take 1 tablet (25 mg total) by mouth daily.    Dispense:  90 tablet    Refill:  3    Followup: Aug-Sept 2015  Nari Vannatter W. Ellyn Hack, M.D., M.S. [facility] [address] [place], [place]  [address]  [phone] Pager # [phone]

## 2014-02-09 ENCOUNTER — Encounter: Payer: Self-pay | Admitting: Cardiology

## 2014-02-09 DIAGNOSIS — Z720 Tobacco use: Secondary | ICD-10-CM | POA: Insufficient documentation

## 2014-02-09 NOTE — Assessment & Plan Note (Signed)
A. symptomatic bradycardia on high dose beta blocker. I backed off on her carvedilol 12.5 mg twice a day, and her heart rates have improved. For now, possibly monitor on lower dose of beta blocker. She was relatively asymptomatic in the past, and monitor did not show any severe bradycardia.

## 2014-02-09 NOTE — Assessment & Plan Note (Signed)
On statin. Monitored by PCP. 

## 2014-02-09 NOTE — Assessment & Plan Note (Signed)
5 minutes of the visit was spent with smoking cessation counseling. She seemed to acknowledge my comments, but did not seem to be in the contemplative stage.

## 2014-02-09 NOTE — Assessment & Plan Note (Signed)
No echocardiographic suggestion of diastolic dysfunction, normal systolic function. No clear vascular lesions.

## 2014-02-09 NOTE — Assessment & Plan Note (Signed)
With backing off on the carvedilol, she is clearly hypertensive. I am adding an ARB.

## 2014-02-18 ENCOUNTER — Other Ambulatory Visit: Payer: Self-pay

## 2014-02-18 ENCOUNTER — Ambulatory Visit (INDEPENDENT_AMBULATORY_CARE_PROVIDER_SITE_OTHER): Payer: Medicare Other

## 2014-02-18 DIAGNOSIS — R9431 Abnormal electrocardiogram [ECG] [EKG]: Secondary | ICD-10-CM

## 2014-02-18 DIAGNOSIS — R0609 Other forms of dyspnea: Secondary | ICD-10-CM

## 2014-02-18 DIAGNOSIS — R001 Bradycardia, unspecified: Secondary | ICD-10-CM

## 2014-02-18 DIAGNOSIS — I498 Other specified cardiac arrhythmias: Secondary | ICD-10-CM

## 2014-08-06 ENCOUNTER — Encounter: Payer: Self-pay | Admitting: Cardiology

## 2014-08-06 ENCOUNTER — Ambulatory Visit (INDEPENDENT_AMBULATORY_CARE_PROVIDER_SITE_OTHER): Payer: Medicare Other | Admitting: Cardiology

## 2014-08-06 VITALS — BP 142/80 | HR 47 | Ht 62.0 in | Wt 109.1 lb

## 2014-08-06 DIAGNOSIS — F17200 Nicotine dependence, unspecified, uncomplicated: Secondary | ICD-10-CM

## 2014-08-06 DIAGNOSIS — Z720 Tobacco use: Secondary | ICD-10-CM

## 2014-08-06 DIAGNOSIS — I10 Essential (primary) hypertension: Secondary | ICD-10-CM

## 2014-08-06 DIAGNOSIS — I498 Other specified cardiac arrhythmias: Secondary | ICD-10-CM

## 2014-08-06 DIAGNOSIS — R0989 Other specified symptoms and signs involving the circulatory and respiratory systems: Secondary | ICD-10-CM

## 2014-08-06 DIAGNOSIS — E785 Hyperlipidemia, unspecified: Secondary | ICD-10-CM

## 2014-08-06 DIAGNOSIS — R9431 Abnormal electrocardiogram [ECG] [EKG]: Secondary | ICD-10-CM

## 2014-08-06 DIAGNOSIS — R001 Bradycardia, unspecified: Secondary | ICD-10-CM

## 2014-08-06 DIAGNOSIS — R0609 Other forms of dyspnea: Secondary | ICD-10-CM

## 2014-08-06 MED ORDER — CARVEDILOL 6.25 MG PO TABS: 6.2500 mg | ORAL_TABLET | Freq: Two times a day (BID) | ORAL | Status: DC

## 2014-08-06 MED ORDER — LOSARTAN POTASSIUM 50 MG PO TABS: 50.0000 mg | ORAL_TABLET | Freq: Every day | ORAL | Status: DC

## 2014-08-06 NOTE — Patient Instructions (Signed)
Your physician has requested that you have a carotid duplex. This test is an ultrasound of the carotid arteries in your neck. It looks at blood flow through these arteries that supply the brain with blood. Allow one hour for this exam. There are no restrictions or special instructions.  Your physician has recommended you make the following change in your medication:  Increase Losartan to 50 mg once daily  Decrease Coreg to 6.25 mg twice daily   Your physician wants you to follow-up in: 6 months. You will receive a reminder letter in the mail two months in advance. If you don't receive a letter, please call our office to schedule the follow-up appointment.

## 2014-08-06 NOTE — Progress Notes (Signed)
PCP: [name], MD  Clinic Note: Chief Complaint  Patient presents with  . OTHER    6 month f/u no complaints. Meds reviewed verbally with pt.    HPI: [name] is a 80 y.o. female with a Cardiovascular Problem List below who presents today for six-month followup. She is very pleasant elderly woman who I first saw in January of this year for bradycardia. She was on high dose of beta blocker which we have reduced in dosage and added an ARB for additional blood pressure control..  Interval History: She presents today without any complaints. She denies any sensation of fatigue or energy. No lack of "get up and go to she says he just doesn't do a whole anyway. She does a lot of canning and freezing and occasionally does some gardening, but mostly the guarding is done by her grand kids  She denies any sensation of dizziness or lightheadedness, syncope/near syncope or TIA/amaurosis fugax symptoms. No chest pain or shortness of breath with rest or exertion. No PND, orthopnea or edema.  No melena, hematochezia, hematuria, or epstaxis. No claudication.  Talk briefly about her smoking, and she basically says that that is her one vice that she truly enjoys doing and has no desire to stop. She says she is 80 years old, and has not k2illed her yet.  Past Medical History  Diagnosis Date  . Hypertension   . Hyperlipidemia   . Gastroesophageal reflux disease   . COPD (chronic obstructive pulmonary disease)   . Osteoporosis   . Tobacco abuse   . Generalized and unspecified atherosclerosis   . Cardiac dysrhythmia, unspecified     Bradycardia  . Clavicular fracture     History of     Prior Cardiac Evaluation and Past Surgical History: Past Surgical History  Procedure Laterality Date  . Vesicovaginal fistula closure w/ tah  20+ years ago  . Appendectomy  20+ years ago  . Back surgery  2 years ago    x2  . Abdominal hysterectomy    . Transthoracic echocardiogram  01/21/2014      Normal LV size and function. No regional wall motion abnormalities. Mild aortic sclerosis with trivial regurgitation.  . Cardiac event monitor  February 2015    Sinus rhythm sinus bradycardia rates of 46-80 beats a minute. No other arrhythmias; occasional PACs with some being blocked., Occasional first-degree AV block.    MEDICATIONS AND ALLERGIES REVIEWED IN EPIC No Change in Social and Family History  ROS: A comprehensive Review of Systems - was performed Review of Systems  Constitutional: Negative for fever, chills and malaise/fatigue.  Eyes: Negative for blurred vision and double vision.  Respiratory: Negative for cough, hemoptysis, sputum production, shortness of breath and wheezing.   Cardiovascular: Negative.        Per HPI  Gastrointestinal: Negative.   Neurological: Negative.  Negative for headaches.  All other systems reviewed and are negative.   Wt Readings from Last 3 Encounters:  08/06/14 109 lb 1.9 oz (49.497 kg)  02/05/14 109 lb 8 oz (49.669 kg)  01/14/14 109 lb 12 oz (49.782 kg)    PHYSICAL EXAM BP 142/80  Pulse 47  Ht 5\' 2"  (1.575 m)  Wt 109 lb 1.9 oz (49.497 kg)  BMI 19.95 kg/m2 General appearance: alert, cooperative, appears stated age, no distress and Pleasant mood and affect. Well-groomed and well-nourished. Answers questions appropriately.  HEENT: [place]/AT, EOMI, MMM, anicteric sclera  Neck: no adenopathy, Soft R sided carotid bruit, no JVD  and supple, symmetrical, trachea midline  Lungs:Mild interstitial sounds;  increased AP diameter and expiratory phase. There is a bucket-handle breathing, but no increased worker breathing. No rhonchi or rales.  Heart: Bradycardic with a regular rhythm. Mildly split S2. Otherwise no M./R./G.  Abdomen: soft, non-tender; bowel sounds normal; no masses, no organomegaly  Extremities: extremities normal, atraumatic, no cyanosis or edema and Mild venous stasis changes. Only minimal varicosities.  Pulses: Palpable 1+ large  cardiac pulses bilaterally.  Skin: Skin color, texture, turgor normal. No rashes or lesions  Neurologic: Grossly normal; CN 2-12 grossly intact. Normal muscle strength.   Adult ECG Report  #1: Rate: 47 ;  Rhythm: sinus bradycardia and Respiratory variation. Otherwise normal voltage, durations, intervals.  #2:  Rate 50; sinus bradycardia with frequent PACs and respiratory variation. Otherwise normal  Recent Labs 04/22/2014   Total cholesterol 190, LDL 111, HDL 58, triglycerides 114.  Apo B 88; Apo A-1 149; Lpa 26; sdLDL 18Lp-PLA2 (PLAC) - 231, Homocysteine 21 (High), hs-CRP 0.8; NT proBNP 305  ASSESSMENT / PLAN: Bradycardia She still remains relatively asymptomatic, however I am a bit leery of heart rates in the 40s in an elderly woman.  Plan: 625 mg twice a day  Essential hypertension Borderline control today. Would reduce the Coreg I would expect her blood pressure did increase. Plan is to increase his losartan 50 mg  Hyperlipidemia with target LDL less than 130 On pravastatin. All of her cholesterol labs look pretty good with the exception of cystine level being elevated everything else looked fairly normal. She is essentially at goal  Tobacco abuse I spent a few minutes talking to her, but she basically shutdown the conversation stating that she was not going to quit.  DOE (dyspnea on exertion) Less noticeable than at last visit. She did not comment about it  Bruit of right carotid artery I don't remember hearing this during last visit, I doubt that it is new. However with her long-term smoking history he and other risk factors we will check carotid Dopplers in order for risk stratification. This would also help [place] know how aggressive we need to be with lipid control.    Orders Placed This Encounter  Procedures  . EKG 12-Lead    Order Specific Question:  Where should this test be performed    Answer:  [facility]  . Doppler carotid    Standing Status: Future      Number of Occurrences:      Standing Expiration Date: 08/06/2015    Order Specific Question:  Laterality    Answer:  Bilateral    Order Specific Question:  Where should this test be performed:    Answer:  [facility]   Meds ordered this encounter  Medications  . carvedilol (COREG) 6.25 MG tablet    Sig: Take 1 tablet (6.25 mg total) by mouth 2 (two) times daily.    Dispense:  180 tablet    Refill:  3  . losartan (COZAAR) 50 MG tablet    Sig: Take 1 tablet (50 mg total) by mouth daily.    Dispense:  90 tablet    Refill:  3    Followup: 6 months  DAVID W. Ellyn Hack, M.D., M.S. Interventional Cardiologist [facility]

## 2014-08-08 ENCOUNTER — Encounter: Payer: Self-pay | Admitting: Cardiology

## 2014-08-08 DIAGNOSIS — R0989 Other specified symptoms and signs involving the circulatory and respiratory systems: Secondary | ICD-10-CM | POA: Insufficient documentation

## 2014-08-08 NOTE — Assessment & Plan Note (Signed)
Borderline control today. Would reduce the Coreg I would expect her blood pressure did increase. Plan is to increase his losartan 50 mg

## 2014-08-08 NOTE — Assessment & Plan Note (Signed)
I spent a few minutes talking to her, but she basically shutdown the conversation stating that she was not going to quit.

## 2014-08-08 NOTE — Assessment & Plan Note (Signed)
I don't remember hearing this during last visit, I doubt that it is new. However with her long-term smoking history he and other risk factors we will check carotid Dopplers in order for risk stratification. This would also help [place] know how aggressive we need to be with lipid control.

## 2014-08-08 NOTE — Assessment & Plan Note (Signed)
On pravastatin. All of her cholesterol labs look pretty good with the exception of cystine level being elevated everything else looked fairly normal. She is essentially at goal

## 2014-08-08 NOTE — Assessment & Plan Note (Signed)
She still remains relatively asymptomatic, however I am a bit leery of heart rates in the 40s in an elderly woman.  Plan: 625 mg twice a day

## 2014-08-08 NOTE — Assessment & Plan Note (Addendum)
Less noticeable than at last visit. She did not comment about it

## 2014-08-12 ENCOUNTER — Encounter (INDEPENDENT_AMBULATORY_CARE_PROVIDER_SITE_OTHER): Payer: Medicare Other

## 2014-08-12 DIAGNOSIS — R0989 Other specified symptoms and signs involving the circulatory and respiratory systems: Secondary | ICD-10-CM

## 2015-01-27 DIAGNOSIS — E784 Other hyperlipidemia: Secondary | ICD-10-CM | POA: Diagnosis not present

## 2015-01-27 DIAGNOSIS — R5383 Other fatigue: Secondary | ICD-10-CM | POA: Diagnosis not present

## 2015-01-27 DIAGNOSIS — I10 Essential (primary) hypertension: Secondary | ICD-10-CM | POA: Diagnosis not present

## 2015-01-27 DIAGNOSIS — E780 Pure hypercholesterolemia: Secondary | ICD-10-CM | POA: Diagnosis not present

## 2015-01-27 DIAGNOSIS — E559 Vitamin D deficiency, unspecified: Secondary | ICD-10-CM | POA: Diagnosis not present

## 2015-01-27 DIAGNOSIS — F17210 Nicotine dependence, cigarettes, uncomplicated: Secondary | ICD-10-CM | POA: Diagnosis not present

## 2015-01-27 DIAGNOSIS — N189 Chronic kidney disease, unspecified: Secondary | ICD-10-CM | POA: Diagnosis not present

## 2015-01-27 DIAGNOSIS — J449 Chronic obstructive pulmonary disease, unspecified: Secondary | ICD-10-CM | POA: Diagnosis not present

## 2015-02-04 ENCOUNTER — Encounter: Payer: Self-pay | Admitting: Cardiology

## 2015-02-04 ENCOUNTER — Ambulatory Visit (INDEPENDENT_AMBULATORY_CARE_PROVIDER_SITE_OTHER): Payer: Medicare Other | Admitting: Cardiology

## 2015-02-04 VITALS — BP 110/78 | HR 61 | Ht 61.0 in | Wt 109.0 lb

## 2015-02-04 DIAGNOSIS — R0609 Other forms of dyspnea: Secondary | ICD-10-CM

## 2015-02-04 DIAGNOSIS — E785 Hyperlipidemia, unspecified: Secondary | ICD-10-CM

## 2015-02-04 DIAGNOSIS — R001 Bradycardia, unspecified: Secondary | ICD-10-CM

## 2015-02-04 DIAGNOSIS — I10 Essential (primary) hypertension: Secondary | ICD-10-CM

## 2015-02-04 NOTE — Patient Instructions (Signed)
Your physician recommends that you continue on your current medications as directed. Please refer to the Current Medication list given to you today.  Your physician wants you to follow-up in: 1 year. You will receive a reminder letter in the mail two months in advance. If you don't receive a letter, please call our office to schedule the follow-up appointment.  Hold dose of Coreg if you become lightheaded or your heart rate is low per Dr. Ellyn Hack

## 2015-02-06 ENCOUNTER — Encounter: Payer: Self-pay | Admitting: Cardiology

## 2015-02-06 DIAGNOSIS — M545 Low back pain: Secondary | ICD-10-CM | POA: Diagnosis not present

## 2015-02-06 DIAGNOSIS — R0989 Other specified symptoms and signs involving the circulatory and respiratory systems: Secondary | ICD-10-CM | POA: Diagnosis not present

## 2015-02-06 DIAGNOSIS — E785 Hyperlipidemia, unspecified: Secondary | ICD-10-CM | POA: Diagnosis not present

## 2015-02-06 NOTE — Assessment & Plan Note (Signed)
She did not mention any unusual exertional dyspnea beyond her baseline. Likely related to COPD

## 2015-02-06 NOTE — Assessment & Plan Note (Signed)
Remains asymptomatic on lower dose of beta blocker. Heart rate seems to be slightly higher than last visit. She has not had any untoward symptoms.  Continue to monitor. No indication for medication adjustment or pacemaker.

## 2015-02-06 NOTE — Assessment & Plan Note (Signed)
On statin. Relatively well-controlled. Monitored by PCP. No myalgias.

## 2015-02-06 NOTE — Progress Notes (Signed)
PCP: [name], MD  Clinic Note: Chief Complaint  Patient presents with  . other    6 month f/u no complaints. Meds reviewed verbally with pt.    HPI: [name] is a 81 y.o. female with a Cardiovascular Problem List below who presents today for six-month followup. She is very pleasant elderly woman who I first saw ~ 1 year ago for bradycardia. She was on high dose of beta blocker which we have reduced in dosage and added an ARB for additional blood pressure control. I last saw her in August 2015, and he was doing relatively well. She was still having heart rates in the 40s but was asymptomatic. We reduced her carvedilol dose to 6.25 mg twice a day and increased her ARB to 50 mg a day.  Interval History: She presents today again without any otablecomplaints. She denies any sensation of fatigue or energy, and has not felt any lightheadedness or dizziness, wooziness, syncope/near syncope or TIA/amaurosis fugax symptoms... No lack of "get up and go to she says he just doesn't do a whole anyway. She still stays very busy doing yard work and activities around the house. In the winter she is not as active because of the cold weather.  She denies chest pain or shortness of breath with rest or exertion. No PND, orthopnea or edema.  No melena, hematochezia, hematuria, or epstaxis. No claudication.  She continues to smoke, with no interest in stopping. When I asked her if she still smokes, she says "is a cat a cat?"  Past Medical History  Diagnosis Date  . Hypertension   . Hyperlipidemia   . Gastroesophageal reflux disease   . COPD (chronic obstructive pulmonary disease)   . Osteoporosis   . Tobacco abuse   . Generalized and unspecified atherosclerosis   . Cardiac dysrhythmia, unspecified     Bradycardia  . Clavicular fracture     History of     Prior Cardiac Evaluation and Past Surgical History: Past Surgical History  Procedure Laterality Date  . Vesicovaginal fistula  closure w/ tah  20+ years ago  . Appendectomy  20+ years ago  . Back surgery  2 years ago    x2  . Abdominal hysterectomy    . Transthoracic echocardiogram  01/21/2014    Normal LV size and function. No regional wall motion abnormalities. Mild aortic sclerosis with trivial regurgitation.  . Cardiac event monitor  February 2015    Sinus rhythm sinus bradycardia rates of 46-80 beats a minute. No other arrhythmias; occasional PACs with some being blocked., Occasional first-degree AV block.   Current Outpatient Prescriptions on File Prior to Visit  Medication Sig Dispense Refill  . alendronate (FOSAMAX) 70 MG tablet Take 70 mg by mouth once a week. Take with a full glass of water on an empty stomach.    . carvedilol (COREG) 6.25 MG tablet Take 1 tablet (6.25 mg total) by mouth 2 (two) times daily. 180 tablet 3  . losartan (COZAAR) 50 MG tablet Take 1 tablet (50 mg total) by mouth daily. 90 tablet 3  . omeprazole (PRILOSEC) 20 MG capsule Take 20 mg by mouth daily.    Marland Kitchen oxybutynin (DITROPAN-XL) 5 MG 24 hr tablet Take 5 mg by mouth at bedtime.    . pravastatin (PRAVACHOL) 40 MG tablet Take 40 mg by mouth daily.     No current facility-administered medications on file prior to visit.   Allergies  Allergen Reactions  . Penicillins   .  Sulfamethizole     No Change in Social and Family History  ROS: A comprehensive Review of Systems - was performed Review of Systems  Constitutional: Negative for malaise/fatigue.  HENT: Negative for nosebleeds.   Respiratory: Negative for shortness of breath.   Cardiovascular: Negative for palpitations.  Gastrointestinal: Negative for blood in stool and melena.  Genitourinary: Negative for hematuria.  Musculoskeletal: Positive for joint pain.  Neurological: Negative for dizziness.  Endo/Heme/Allergies: Does not bruise/bleed easily.  Psychiatric/Behavioral: Negative.   All other systems reviewed and are negative.   Wt Readings from Last 3 Encounters:    02/04/15 109 lb (49.442 kg)  08/06/14 109 lb 1.9 oz (49.497 kg)  02/05/14 109 lb 8 oz (49.669 kg)    PHYSICAL EXAM BP 110/78 mmHg  Pulse 61  Ht 5\' 1"  (1.549 m)  Wt 109 lb (49.442 kg)  BMI 20.61 kg/m2 General appearance: alert, cooperative, appears stated age, no distress and Pleasant mood and affect. Well-groomed and well-nourished. Answers questions appropriately.  HEENT: [place]/AT, EOMI, MMM, anicteric sclera  Neck: no adenopathy, Soft R sided carotid bruit, no JVD and supple, symmetrical, trachea midline  Lungs:Mild interstitial sounds;  increased AP diameter and expiratory phase & bucket-handle breathing, but no increased worker breathing. No rhonchi or rales.  Heart: Borderline Bradycardic with a regular rhythm. Mildly split S2. Otherwise no M./R./G.  Abdomen: soft, non-tender; bowel sounds normal; no masses, no organomegaly  Extremities: No C/C/E; mild venous stasis changes in BLE; Pulses: Palpable 1+ large cardiac pulses bilaterally.  Neurologic: Grossly normal; CN 2-12 grossly intact. Normal muscle strength.   Adult ECG Report   Rate: 61;  Rhythm: normal sinus rhythm, premature atrial contractions (PAC) and  Otherwise normal voltage, durations, intervals.  Recent Labs  From PCP   Total cholesterol 172, LDL 96, HDL 51, triglycerides 125.  ASSESSMENT / PLAN: Bradycardia Remains asymptomatic on lower dose of beta blocker. Heart rate seems to be slightly higher than last visit. She has not had any untoward symptoms.  Continue to monitor. No indication for medication adjustment or pacemaker.   Essential hypertension Blood pressure was excellent with the increased dose of ARB that was increased in response to reducing beta blocker.   Hyperlipidemia with target LDL less than 130 On statin. Relatively well-controlled. Monitored by PCP. No myalgias.   DOE (dyspnea on exertion) She did not mention any unusual exertional dyspnea beyond her baseline. Likely related to  COPD     Orders Placed This Encounter  Procedures  . EKG 12-Lead    Order Specific Question:  Where should this test be performed    Answer:  [facility]   No orders of the defined types were placed in this encounter.    Followup: one year   Aarohi Redditt W. Ellyn Hack, M.D., M.S. Interventional Cardiologist [facility]

## 2015-02-06 NOTE — Assessment & Plan Note (Signed)
Blood pressure was excellent with the increased dose of ARB that was increased in response to reducing beta blocker.

## 2015-02-20 DIAGNOSIS — J449 Chronic obstructive pulmonary disease, unspecified: Secondary | ICD-10-CM | POA: Diagnosis not present

## 2015-02-20 DIAGNOSIS — E784 Other hyperlipidemia: Secondary | ICD-10-CM | POA: Diagnosis not present

## 2015-02-20 DIAGNOSIS — I499 Cardiac arrhythmia, unspecified: Secondary | ICD-10-CM | POA: Diagnosis not present

## 2015-02-20 DIAGNOSIS — I10 Essential (primary) hypertension: Secondary | ICD-10-CM | POA: Diagnosis not present

## 2015-04-30 LAB — PULMONARY FUNCTION TEST

## 2015-05-20 ENCOUNTER — Telehealth: Payer: Self-pay | Admitting: *Deleted

## 2015-05-20 NOTE — Telephone Encounter (Signed)
Pt daughter stating that when pt goes to bed at night She says her heart racing fast at night, it is so bad that it is effecting her sleep pattern She would like to bring her in, for an EKG Pt is not tell daughter much, daughter is a bit scared for her.   Please advise.

## 2015-05-20 NOTE — Telephone Encounter (Signed)
S/w patient who indicated every once in awhile, maybe a few time a month, she is awakened by a rapid heart rate. States it feels like her heart is jumping out of her chest. She states it only lasts a few seconds and goes away on its own Denies chest pain, chest pressure, or sweating States she is taking medication as directed and no new medications have been added. Suggested to patient that next time this happens, count her BPM and notify us of the findings.  Also suggested to purchase a pulse ox/HR monitor and place on her finger when the episode occurs to determine HR.  Pt verbalized understanding with no further questions.

## 2015-08-17 ENCOUNTER — Other Ambulatory Visit: Payer: Self-pay | Admitting: Cardiology

## 2015-08-17 NOTE — Telephone Encounter (Signed)
REFILL 

## 2015-12-22 ENCOUNTER — Telehealth: Payer: Self-pay

## 2015-12-22 NOTE — Telephone Encounter (Signed)
Pt daughter called, states when pt sleeps, it feels like her "heart is beating out of her chest". States this has happened about 4 days out of 2 weeks. Her BP yesterday was 150/78, HR 70. Daughter states when she had this episode, took her BP and it was 170/80. Please call.

## 2015-12-22 NOTE — Telephone Encounter (Signed)
S/w pt who states she has been awakened with what feels like "my heart is jumping out of my chest". States it only happens "sometimes" and is unable to express how frequently this happens. States "sometimes it lasts awhile and sometimes I can go right back to sleep". Denies SOB. She has a BP cuff borrowed from her neighbor and states HR "has been fine" however, she did not take it when she was awakened with symptoms.  She has 1 year f/u appt in Feb. Asked pt if she would like a sooner appt in [place] if there is one available. States she does not want to go to [facility] and feels it can wait until Feb.  Reviewed pt current medications which she states she takes regularly with no missed doses. Advised pt to monitor BP and HR x 5 days and keep a record. She understands to take vitals in the morning and one hour after taking BP meds as her daughter reported pt BP 150/78 and 170/80. She will report readings to [place].  Pt verbalized understanding, states she thinks she is "alright". She had no further questions.

## 2016-02-10 ENCOUNTER — Ambulatory Visit (INDEPENDENT_AMBULATORY_CARE_PROVIDER_SITE_OTHER): Payer: Medicare PPO | Admitting: Cardiology

## 2016-02-10 ENCOUNTER — Encounter: Payer: Self-pay | Admitting: Cardiology

## 2016-02-10 VITALS — BP 150/90 | HR 56 | Ht 61.0 in | Wt 105.4 lb

## 2016-02-10 DIAGNOSIS — E785 Hyperlipidemia, unspecified: Secondary | ICD-10-CM

## 2016-02-10 DIAGNOSIS — I10 Essential (primary) hypertension: Secondary | ICD-10-CM | POA: Diagnosis not present

## 2016-02-10 DIAGNOSIS — Z720 Tobacco use: Secondary | ICD-10-CM

## 2016-02-10 DIAGNOSIS — R001 Bradycardia, unspecified: Secondary | ICD-10-CM

## 2016-02-10 NOTE — Patient Instructions (Signed)
Medication Instructions:  Your physician recommends that you continue on your current medications as directed. Please refer to the Current Medication list given to you today.   Labwork: none  Testing/Procedures: none  Follow-Up: Your physician wants you to follow-up in: one year with Dr. Harding.  You will receive a reminder letter in the mail two months in advance. If you don't receive a letter, please call our office to schedule the follow-up appointment.   Any Other Special Instructions Will Be Listed Below (If Applicable).     If you need a refill on your cardiac medications before your next appointment, please call your pharmacy.   

## 2016-02-10 NOTE — Progress Notes (Signed)
PCP: [name], MD  Clinic Note: Chief Complaint  Patient presents with  . Bradycardia    HPI: [name] is a 82 y.o. female with a PMH below who presents today for annual f/u for HTN & Bradycardia while on high dose BB - reduced & ARB added.[name] was last seen in Feb 2016 - doing well; Coreg was @ 6.25 bid & ARB up to 50 mg. No complaints.  Recent Hospitalizations: none  Studies Reviewed: Seen below from notes sent by PCP  Interval History: She says that she is feeling fine. BP was high over Christmas & felt heart beating hard & ? Fast.  BPs in 180s/90s. Only notes hard beating when lying down.  Not nearly like is was over Christmas & new years.   No chest pain or shortness of breath with rest or exertion. No PND, orthopnea or edema. No palpitations, lightheadedness, dizziness, weakness or syncope/near syncope. No TIA/amaurosis fugax symptoms. No claudication.  ROS: A comprehensive was performed. Pertinent positives noted above Review of Systems  Respiratory: Negative.   Cardiovascular: Negative.   Gastrointestinal: Negative for blood in stool and melena.  Genitourinary: Negative for hematuria.  Musculoskeletal: Negative for myalgias and falls.  Neurological: Positive for headaches.  Psychiatric/Behavioral: Negative for memory loss.  All other systems reviewed and are negative.    Past Medical History  Diagnosis Date  . Hypertension   . Hyperlipidemia   . Gastroesophageal reflux disease   . COPD (chronic obstructive pulmonary disease) ([place])   . Osteoporosis   . Tobacco abuse   . Generalized and unspecified atherosclerosis   . Cardiac dysrhythmia, unspecified     Bradycardia  . Clavicular fracture     History of     Past Surgical History  Procedure Laterality Date  . Vesicovaginal fistula closure w/ tah  20+ years ago  . Appendectomy  20+ years ago  . Back surgery  2 years ago    x2  . Abdominal hysterectomy    . Transthoracic  echocardiogram  01/21/2014    Normal LV size and function. No regional wall motion abnormalities. Mild aortic sclerosis with trivial regurgitation.  . Cardiac event monitor  February 2015    Sinus rhythm sinus bradycardia rates of 46-80 beats a minute. No other arrhythmias; occasional PACs with some being blocked., Occasional first-degree AV block.    Prior to Admission medications   Medication Sig Start Date End Date Taking? Authorizing Provider  alendronate (FOSAMAX) 70 MG tablet Take 70 mg by mouth once a week. Take with a full glass of water on an empty stomach.   Yes Historical Provider, MD  carvedilol (COREG) 6.25 MG tablet TAKE ONE TABLET BY MOUTH TWICE DAILY 08/17/15  Yes Leonie Man, MD  losartan (COZAAR) 50 MG tablet Take 1 tablet (50 mg total) by mouth daily. 08/06/14  Yes Leonie Man, MD  omeprazole (PRILOSEC) 20 MG capsule Take 20 mg by mouth daily.   Yes Historical Provider, MD  oxybutynin (DITROPAN-XL) 5 MG 24 hr tablet Take 5 mg by mouth at bedtime.   Yes Historical Provider, MD  pravastatin (PRAVACHOL) 40 MG tablet Take 40 mg by mouth daily.   Yes Historical Provider, MD   Allergies  Allergen Reactions  . Penicillins   . Sulfamethizole     Social History   Social History  . Marital Status: Widowed    Spouse Name: [name]  . Number of Children: 5  . Years of Education: [name]  Social History Main Topics  . Smoking status: Current Every Day Smoker -- 1.00 packs/day for 50 years  . Smokeless tobacco: None  . Alcohol Use: No  . Drug Use: No  . Sexual Activity: Not Asked   Other Topics Concern  . None   Social History Narrative   She is a widowed mother of 75.     Current every day smoker - ~1 ppd x ~50 yrs   Does not drink EtOH.   Active - but no routine exercise.    Family History  Problem Relation Age of Onset  . Heart attack Sister   . Cancer Brother      Wt Readings from Last 3 Encounters:  02/10/16 105 lb 6.4 oz (47.809 kg)  02/04/15 109 lb  (49.442 kg)  08/06/14 109 lb 1.9 oz (49.497 kg)    PHYSICAL EXAM BP 150/90 mmHg  Pulse 56  Ht 5\' 1"  (1.549 m)  Wt 105 lb 6.4 oz (47.809 kg)  BMI 19.93 kg/m2  SpO2 98% General appearance: alert, cooperative, appears stated age, no distress and Pleasant mood and affect. Well-groomed and well-nourished. Answers questions appropriately.  HEENT: [place]/AT, EOMI, MMM, anicteric sclera  Neck: no adenopathy, Soft R sided carotid bruit, no JVD and supple, symmetrical, trachea midline  Lungs:Mild interstitial sounds; increased AP diameter and expiratory phase & bucket-handle breathing, but no increased worker breathing. No rhonchi or rales.  Heart: Borderline Bradycardic with a regular rhythm. Mildly split S2. Otherwise no M./R./G.  Abdomen: soft, non-tender; bowel sounds normal; no masses, no organomegaly  Extremities: No C/C/E; mild venous stasis changes in BLE; Pulses: Palpable 1+ large cardiac pulses bilaterally.  Neurologic: Grossly normal; CN 2-12 grossly intact. Normal muscle strength.    Adult ECG Report  Rate: 56 ;  Rhythm: sinus bradycardia and Left axis deviation (-60 - cannot exclude LAFB). Does not have the appearance of incomplete right bundle branch block or septal infarct, age undetermined as indicated on the computer report.;   Narrative Interpretation: No significant change from prior EKG.   Other studies Reviewed: Additional studies/ records that were reviewed today include:  Recent Labs: 12/02/2015  Na+ 129, K+ 3.7, Cl- 92, HCO3- 25 , BUN 15, Cr 1.29, Glu 85, Ca2+ 9.7; AST 16, ALT 7; AlkP 41, Alb 4.2, TP 6.7, T Bili 0.3  CBC: W 5.8, H/H 13.8/39.6 Plt 193  TC 199, TG 94, HDL 71, LDL 109   Renal ultrasound: Right kidney 8.58 cm, left kidney 8.5-7 m. Stone noted in lower pole of both left and right kidneys. No mass effect or obstructive uropathy noted. --> Bilateral nephrolithiasis without evidence of obstructive uropathy  Thyroid ultrasound: Solid thyroid gland  nodules:  Right thyroid lobe 3.27 x 1.4 x 1.34 cm --> hyperechoic solid nodule measuring 4.8 x 4.1 mm. Left thyroid lobe is 2.93 x 1.0 x 1.15 cm. Hyperechoic solid nodule in upper pole measuring 3.5 x 2.9 mm  ASSESSMENT / PLAN: Problem List Items Addressed This Visit    Tobacco abuse (Chronic)    Not interested in quitting      Hyperlipidemia with target LDL less than 130 (Chronic)    She is on protocol. No obvious evidence of coronary disease. Target LDL would be close to 100. Her recordings from PCP indicated that she is relatively well-controlled.      Essential hypertension (Chronic)    Blood pressure is low but high today. Her pressure was low but better when she saw her PCP. We may be able to  increase or her losartan dose. However she has had some dizziness, and I would probably shoot for target blood pressure range of [id number] to [id number] systolic for her.      Relevant Orders   EKG 12-Lead (Completed)   Bradycardia - Primary (Chronic)    Mild bradycardia with reduced dose of beta blocker. I wonder if her pounding sensations that she felt was from bradycardia or from hypertension. We discussed that if she feels the sensation again, she should check her blood pressure. If it is high, take an additional dose of losartan      Relevant Orders   EKG 12-Lead (Completed)      Current medicines are reviewed at length with the patient today. (+/- concerns) concerned about the pounding sensation in her head The following changes have been made:    Studies Ordered:   Orders Placed This Encounter  Procedures  . EKG 12-Lead   Follow-up in one year   Jentri Aye, Leonie Green, M.D., M.S. Interventional Cardiologist   Pager # [phone] Phone # [phone] [address]. [place], [place] [address]

## 2016-02-12 ENCOUNTER — Encounter: Payer: Self-pay | Admitting: Cardiology

## 2016-02-12 NOTE — Assessment & Plan Note (Signed)
She is on protocol. No obvious evidence of coronary disease. Target LDL would be close to 100. Her recordings from PCP indicated that she is relatively well-controlled.

## 2016-02-12 NOTE — Assessment & Plan Note (Signed)
Not interested in quitting 

## 2016-02-12 NOTE — Assessment & Plan Note (Signed)
Blood pressure is low but high today. Her pressure was low but better when she saw her PCP. We may be able to increase or her losartan dose. However she has had some dizziness, and I would probably shoot for target blood pressure range of [id number] to [id number] systolic for her.

## 2016-02-12 NOTE — Assessment & Plan Note (Signed)
Mild bradycardia with reduced dose of beta blocker. I wonder if her pounding sensations that she felt was from bradycardia or from hypertension. We discussed that if she feels the sensation again, she should check her blood pressure. If it is high, take an additional dose of losartan

## 2016-09-16 ENCOUNTER — Telehealth: Payer: Self-pay | Admitting: Cardiology

## 2016-09-16 NOTE — Telephone Encounter (Signed)
Spoke with patient about her Carotid u/s (2 yr f/u) and she didn't want to schedule at this time. She would discuss this with her pcp.

## 2016-10-18 ENCOUNTER — Ambulatory Visit (INDEPENDENT_AMBULATORY_CARE_PROVIDER_SITE_OTHER): Payer: Medicare HMO | Admitting: Cardiology

## 2016-10-18 ENCOUNTER — Encounter: Payer: Self-pay | Admitting: Cardiology

## 2016-10-18 ENCOUNTER — Encounter (INDEPENDENT_AMBULATORY_CARE_PROVIDER_SITE_OTHER): Payer: Self-pay

## 2016-10-18 VITALS — BP 158/90 | HR 60 | Ht 62.0 in | Wt 99.0 lb

## 2016-10-18 DIAGNOSIS — R001 Bradycardia, unspecified: Secondary | ICD-10-CM

## 2016-10-18 DIAGNOSIS — R9431 Abnormal electrocardiogram [ECG] [EKG]: Secondary | ICD-10-CM

## 2016-10-18 DIAGNOSIS — I10 Essential (primary) hypertension: Secondary | ICD-10-CM

## 2016-10-18 DIAGNOSIS — Z720 Tobacco use: Secondary | ICD-10-CM | POA: Diagnosis not present

## 2016-10-18 NOTE — Progress Notes (Signed)
Cardiology Office Note   Date:  10/18/2016   ID:  [name], DOB September 25, 1933, MRN [id number]  Referring Doctor:  [name], MD   Cardiologist:   Wende Bushy, MD   Reason for consultation:  Chief Complaint  Patient presents with  . other    New patient. 6 month f/u former Dr. Ellyn Hack patient.      History of Present Illness: [name] is a 82 y.o. female who presents for Follow-up for hypertension, bradycardia  Since last visit with Dr. Ellyn Hack, patient has been doing quite well. She occasionally reports pounding in the chest but not as frequent as before when they did a monitor on her. Patient denies chest pain and shortness of breath. Patient has not been really checking her blood pressure at home.  Patient denies PND, orthopnea, edema. No abdominal pain. No loss of consciousness or syncope.   ROS:  Please see the history of present illness. Aside from mentioned under HPI, all other systems are reviewed and negative.     Past Medical History:  Diagnosis Date  . Cardiac dysrhythmia, unspecified    Bradycardia  . Clavicular fracture    History of   . COPD (chronic obstructive pulmonary disease) ([place])   . Gastroesophageal reflux disease   . Generalized and unspecified atherosclerosis   . Hyperlipidemia   . Hypertension   . Osteoporosis   . Tobacco abuse     Past Surgical History:  Procedure Laterality Date  . ABDOMINAL HYSTERECTOMY    . APPENDECTOMY  20+ years ago  . BACK SURGERY  2 years ago   x2  . Cardiac Event Monitor  February 2015   Sinus rhythm sinus bradycardia rates of 46-80 beats a minute. No other arrhythmias; occasional PACs with some being blocked., Occasional first-degree AV block.  . TRANSTHORACIC ECHOCARDIOGRAM  01/21/2014   Normal LV size and function. No regional wall motion abnormalities. Mild aortic sclerosis with trivial regurgitation.  . VESICOVAGINAL FISTULA CLOSURE W/ TAH  20+ years ago     reports that she has  been smoking.  She has a 50.00 pack-year smoking history. She has never used smokeless tobacco. She reports that she does not drink alcohol or use drugs.   family history includes Cancer in her brother; Heart attack in her sister.   Outpatient Medications Prior to Visit  Medication Sig Dispense Refill  . carvedilol (COREG) 6.25 MG tablet TAKE ONE TABLET BY MOUTH TWICE DAILY 180 tablet 3  . losartan (COZAAR) 50 MG tablet Take 1 tablet (50 mg total) by mouth daily. 90 tablet 3  . omeprazole (PRILOSEC) 20 MG capsule Take 20 mg by mouth daily.    Marland Kitchen oxybutynin (DITROPAN-XL) 5 MG 24 hr tablet Take 5 mg by mouth at bedtime.    . pravastatin (PRAVACHOL) 40 MG tablet Take 40 mg by mouth daily.    Marland Kitchen alendronate (FOSAMAX) 70 MG tablet Take 70 mg by mouth once a week. Take with a full glass of water on an empty stomach.     No facility-administered medications prior to visit.    Patient taking losartan 100 mg daily, likely prescribed by PCP.  Allergies: Penicillins and Sulfamethizole    PHYSICAL EXAM: VS:  BP (!) 158/90   Pulse 60   Ht 5\' 2"  (1.575 m)   Wt 99 lb (44.9 kg)   BMI 18.11 kg/m  , Body mass index is 18.11 kg/m. Wt Readings from Last 3 Encounters:  10/18/16 99 lb (44.9 kg)  02/10/16 105 lb 6.4 oz (47.8 kg)  02/04/15 109 lb (49.4 kg)    GENERAL:  well developed, well nourished, not in acute distress HEENT: normocephalic, pink conjunctivae, anicteric sclerae, no xanthelasma, normal dentition, oropharynx clear NECK:  no neck vein engorgement, JVP normal, no hepatojugular reflux, carotid upstroke brisk and symmetric, no bruit, no thyromegaly, no lymphadenopathy LUNGS:  good respiratory effort, clear to auscultation bilaterally CV:  PMI not displaced, no thrills, no lifts, S1 and S2 within normal limits, no palpable S3 or S4, no murmurs, no rubs, no gallops ABD:  Soft, nontender, nondistended, normoactive bowel sounds, no abdominal aortic bruit, no hepatomegaly, no splenomegaly MS:  nontender back, no kyphosis, no scoliosis, no joint deformities EXT:  2+ DP/PT pulses, no edema, no varicosities, no cyanosis, no clubbing SKIN: warm, nondiaphoretic, normal turgor, no ulcers NEUROPSYCH: alert, oriented to person, place, and time, sensory/motor grossly intact, normal mood, appropriate affect  Recent Labs: No results found for requested labs within last 8760 hours.   Lipid Panel No results found for: CHOL, TRIG, HDL, CHOLHDL, VLDL, LDLCALC, LDLDIRECT   Other studies Reviewed:  EKG:  The ekg from 10/18/2016 was personally reviewed by me and it revealed sinus rhythm, 60 bpm incomplete right bundle-branch block. Q waves in V1 and the 2. No significant change from EKG 02/10/2016.  Additional studies/ records that were reviewed personally reviewed by me today include:  Echo 01/21/2014: Left ventricle: The cavity size was normal. Systolic function was normal. Wall motion was normal; there were no regional wall motion abnormalities. - Aortic valve: Trivial regurgitation. - Mitral valve: Mild regurgitation.   ASSESSMENT AND PLAN: Hypertension Recommend to do blood pressure log. Patient to call our office if greater than 140/80 persistently.  Bradycardia Incomplete right bundle-branch block Stable on lower dose of Coreg. Continue to monitor.  Aorticregurgitation,trivial Recommend serial evaluation  Palpitations Not as frequent as in the past. In 2015, she wore a monitor for further evaluation. No arrhythmia noted at that time. Per daughter, this happens mostly when patient is anxious. Continue to monitor. If persistent or more intense or frequent, patient to report to our office at that we could do another monitor at that time.  Tobacco use Continues to smoke more than one pack per day, no intention of quitting at this time. Smoking cessation recommended.  Current medicines are reviewed at length with the patient today.  The patient does not have concerns  regarding medicines.  Labs/ tests ordered today include:  Orders Placed This Encounter  Procedures  . EKG 12-Lead    I had a lengthy and detailed discussion with the patient regarding diagnoses, prognosis, diagnostic options, treatment options , and side effects of medications.   I counseled the patient on importance of lifestyle modification including heart healthy diet, regular physical activity  , and smoking cessation.   Disposition:   FU with undersigned In 6 months   Signed, Wende Bushy, MD  10/18/2016 4:23 PM    [place]  This note was generated in part with voice recognition software and I apologize for any typographical errors that were not detected and corrected.

## 2016-10-18 NOTE — Patient Instructions (Signed)
Follow-Up: Your physician wants you to follow-up in: 6 months with Dr. Ingal. You will receive a reminder letter in the mail two months in advance. If you don't receive a letter, please call our office to schedule the follow-up appointment.  It was a pleasure seeing you today here in the office. Please do not hesitate to give us a call back if you have any further questions. [phone]  Peyton Spengler A. RN, BSN    

## 2017-01-12 DIAGNOSIS — H2513 Age-related nuclear cataract, bilateral: Secondary | ICD-10-CM | POA: Diagnosis not present

## 2017-01-16 ENCOUNTER — Encounter: Payer: Self-pay | Admitting: *Deleted

## 2017-01-19 ENCOUNTER — Encounter: Payer: Self-pay | Admitting: Anesthesiology

## 2017-01-19 ENCOUNTER — Ambulatory Visit: Payer: Medicare HMO | Admitting: Anesthesiology

## 2017-01-19 ENCOUNTER — Ambulatory Visit
Admission: RE | Admit: 2017-01-19 | Discharge: 2017-01-19 | Disposition: A | Discharge status: Home / Self Care | Payer: Medicare HMO | Source: Ambulatory Visit | Attending: Ophthalmology | Admitting: Ophthalmology

## 2017-01-19 ENCOUNTER — Encounter
Admission: RE | Disposition: A | Discharge status: Home / Self Care | Payer: Self-pay | Source: Ambulatory Visit | Attending: Ophthalmology

## 2017-01-19 DIAGNOSIS — K219 Gastro-esophageal reflux disease without esophagitis: Secondary | ICD-10-CM | POA: Diagnosis not present

## 2017-01-19 DIAGNOSIS — R69 Illness, unspecified: Secondary | ICD-10-CM | POA: Diagnosis not present

## 2017-01-19 DIAGNOSIS — I10 Essential (primary) hypertension: Secondary | ICD-10-CM | POA: Diagnosis not present

## 2017-01-19 DIAGNOSIS — R0601 Orthopnea: Secondary | ICD-10-CM | POA: Diagnosis not present

## 2017-01-19 DIAGNOSIS — J449 Chronic obstructive pulmonary disease, unspecified: Secondary | ICD-10-CM | POA: Diagnosis not present

## 2017-01-19 DIAGNOSIS — Z79899 Other long term (current) drug therapy: Secondary | ICD-10-CM | POA: Diagnosis not present

## 2017-01-19 DIAGNOSIS — H2181 Floppy iris syndrome: Secondary | ICD-10-CM | POA: Diagnosis not present

## 2017-01-19 DIAGNOSIS — H269 Unspecified cataract: Secondary | ICD-10-CM | POA: Diagnosis not present

## 2017-01-19 DIAGNOSIS — H2512 Age-related nuclear cataract, left eye: Secondary | ICD-10-CM | POA: Diagnosis present

## 2017-01-19 DIAGNOSIS — H2513 Age-related nuclear cataract, bilateral: Secondary | ICD-10-CM | POA: Diagnosis not present

## 2017-01-19 DIAGNOSIS — E785 Hyperlipidemia, unspecified: Secondary | ICD-10-CM | POA: Diagnosis not present

## 2017-01-19 DIAGNOSIS — F17200 Nicotine dependence, unspecified, uncomplicated: Secondary | ICD-10-CM | POA: Diagnosis not present

## 2017-01-19 HISTORY — DX: Orthopnea: R06.01

## 2017-01-19 HISTORY — DX: Unspecified hearing loss, unspecified ear: H91.90

## 2017-01-19 HISTORY — PX: CATARACT EXTRACTION W/PHACO: SHX586

## 2017-01-19 SURGERY — PHACOEMULSIFICATION, CATARACT, WITH IOL INSERTION
Anesthesia: Monitor Anesthesia Care | Site: Eye | Laterality: Left | Wound class: Clean

## 2017-01-19 MED ORDER — MOXIFLOXACIN HCL 0.5 % OP SOLN
OPHTHALMIC | Status: DC | PRN
  Administered 2017-01-19: 1 [drp] via OPHTHALMIC

## 2017-01-19 MED ORDER — SODIUM CHLORIDE 0.9 % IV SOLN
INTRAVENOUS | Status: DC
  Administered 2017-01-19: 50 mL/h via INTRAVENOUS
  Administered 2017-01-19: 09:00:00 via INTRAVENOUS

## 2017-01-19 MED ORDER — MOXIFLOXACIN HCL 0.5 % OP SOLN: 1.0000 [drp] | OPHTHALMIC | Status: DC | PRN

## 2017-01-19 MED ORDER — ARMC OPHTHALMIC DILATING DROPS
1.0000 "application " | OPHTHALMIC | Status: AC
  Administered 2017-01-19: 1 via OPHTHALMIC

## 2017-01-19 MED ORDER — MOXIFLOXACIN HCL 0.5 % OP SOLN
OPHTHALMIC | Status: AC
  Filled 2017-01-19: qty 3

## 2017-01-19 MED ORDER — SODIUM HYALURONATE 10 MG/ML IO SOLN
INTRAOCULAR | Status: AC
  Filled 2017-01-19: qty 0.85

## 2017-01-19 MED ORDER — FENTANYL CITRATE (PF) 100 MCG/2ML IJ SOLN
INTRAMUSCULAR | Status: DC | PRN
  Administered 2017-01-19: 50 ug via INTRAVENOUS

## 2017-01-19 MED ORDER — EPINEPHRINE PF 1 MG/ML IJ SOLN
INTRAMUSCULAR | Status: AC
  Filled 2017-01-19: qty 2

## 2017-01-19 MED ORDER — SODIUM HYALURONATE 23 MG/ML IO SOLN
INTRAOCULAR | Status: AC
  Filled 2017-01-19: qty 0.6

## 2017-01-19 MED ORDER — ARMC OPHTHALMIC DILATING DROPS
OPHTHALMIC | Status: AC
  Administered 2017-01-19: 1 via OPHTHALMIC
  Filled 2017-01-19: qty 0.4

## 2017-01-19 MED ORDER — LIDOCAINE HCL (PF) 4 % IJ SOLN
INTRAOCULAR | Status: DC | PRN
  Administered 2017-01-19: 4 mL via OPHTHALMIC

## 2017-01-19 MED ORDER — SODIUM HYALURONATE 10 MG/ML IO SOLN
INTRAOCULAR | Status: DC | PRN
  Administered 2017-01-19: 0.55 mL via INTRAOCULAR

## 2017-01-19 MED ORDER — SODIUM HYALURONATE 23 MG/ML IO SOLN
INTRAOCULAR | Status: DC | PRN
  Administered 2017-01-19: 0.6 mL via INTRAOCULAR

## 2017-01-19 MED ORDER — FENTANYL CITRATE (PF) 100 MCG/2ML IJ SOLN
INTRAMUSCULAR | Status: AC
  Filled 2017-01-19: qty 2

## 2017-01-19 MED ORDER — ARMC OPHTHALMIC DILATING DROPS
1.0000 "application " | OPHTHALMIC | Status: AC | PRN
  Administered 2017-01-19 (×2): 1 via OPHTHALMIC

## 2017-01-19 MED ORDER — BSS IO SOLN
INTRAOCULAR | Status: DC | PRN
  Administered 2017-01-19: 200 mL via OPHTHALMIC

## 2017-01-19 MED ORDER — LIDOCAINE HCL (PF) 4 % IJ SOLN
INTRAMUSCULAR | Status: AC
  Filled 2017-01-19: qty 5

## 2017-01-19 MED ORDER — POVIDONE-IODINE 5 % OP SOLN
OPHTHALMIC | Status: AC
  Filled 2017-01-19: qty 30

## 2017-01-19 SURGICAL SUPPLY — 22 items
CANNULA ANT/CHMB 27G (MISCELLANEOUS) ×2 IMPLANT
CANNULA ANT/CHMB 27GA (MISCELLANEOUS) ×6 IMPLANT
CUP MEDICINE 2OZ PLAST GRAD ST (MISCELLANEOUS) ×2 IMPLANT
DISSECTOR HYDRO NUCLEUS 50X22 (MISCELLANEOUS) ×2 IMPLANT
GLOVE BIO SURGEON STRL SZ8 (GLOVE) ×2 IMPLANT
GLOVE BIOGEL M 6.5 STRL (GLOVE) ×2 IMPLANT
GLOVE SURG LX 7.5 STRW (GLOVE) ×1
GLOVE SURG LX STRL 7.5 STRW (GLOVE) ×1 IMPLANT
GOWN STRL REUS W/ TWL LRG LVL3 (GOWN DISPOSABLE) ×2 IMPLANT
GOWN STRL REUS W/TWL LRG LVL3 (GOWN DISPOSABLE) ×4
LENS IOL TECNIS ITEC 24.5 (Intraocular Lens) ×1 IMPLANT
PACK CATARACT (MISCELLANEOUS) ×2 IMPLANT
PACK CATARACT KING (MISCELLANEOUS) ×2 IMPLANT
PACK EYE AFTER SURG (MISCELLANEOUS) ×2 IMPLANT
RING CAPSULAR TYPE 14 UNLOAD (Ring) ×1 IMPLANT
SOL BSS BAG (MISCELLANEOUS) ×2
SOLUTION BSS BAG (MISCELLANEOUS) ×1 IMPLANT
SYR 3ML LL SCALE MARK (SYRINGE) ×4 IMPLANT
SYR 5ML LL (SYRINGE) ×2 IMPLANT
SYR TB 1ML 27GX1/2 LL (SYRINGE) ×2 IMPLANT
WATER STERILE IRR 250ML POUR (IV SOLUTION) ×2 IMPLANT
WIPE NON LINTING 3.25X3.25 (MISCELLANEOUS) ×2 IMPLANT

## 2017-01-19 NOTE — Op Note (Signed)
OPERATIVE NOTE  [name] [id number] 01/19/2017   PREOPERATIVE DIAGNOSIS:  Nuclear sclerotic cataract left eye.  H25.12   POSTOPERATIVE DIAGNOSIS:     1.  Nuclear sclerotic cataract left eye. 2.  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 3.  Floppy iris syndrome.   PROCEDURE:  Complex Phacoemusification with posterior chamber intraocular lens placement of the left eye AND placement of a capsular tension ring due to zonular weakness, left eye.  LENS:   Implant Name Type Inv. Item Serial No. Manufacturer Lot No. LRB No. Used  LENS IOL DIOP 24.5 - [id number] Intraocular Lens LENS IOL DIOP 24.5 [id number]  Left 1  RING CAPSULAR TYPE 14 - [id number] Ring RING CAPSULAR TYPE 14 [id number] [facility] [facility] Left 1       PCB00 +24.5   ULTRASOUND TIME: 0 minutes 58 seconds.  CDE 8.63   SURGEON:  Benay Pillow, MD, MPH   ANESTHESIA:  Topical with tetracaine drops augmented with 1% preservative-free intracameral lidocaine.  ESTIMATED BLOOD LOSS: <1 mL   COMPLICATIONS:  None.   DESCRIPTION OF PROCEDURE:  The patient was identified in the holding room and transported to the operating room and placed in the supine position under the operating microscope.  The left eye was identified as the operative eye and it was prepped and draped in the usual sterile ophthalmic fashion.   A 1.0 millimeter clear-corneal paracentesis was made at the 5:00 position. 0.5 ml of preservative-free 1% lidocaine with epinephrine was injected into the anterior chamber.  The anterior chamber was filled with Healon 5 viscoelastic.  A 2.4 millimeter keratome was used to make a near-clear corneal incision at the 2:00 position.  A curvilinear capsulorrhexis was made with a cystotome and capsulorrhexis forceps.  Balanced salt solution was used to hydrodissect and hydrodelineate the nucleus.  The iris was milldly miotic and floppy, but a mechanical iris stabilization device was not needed.  Viscodilation was  employed.   Phacoemulsification was then used in stop and chop fashion to remove the lens nucleus and epinucleus.  The remaining cortex was then removed using the irrigation and aspiration handpiece. Healon was then placed into the capsular bag to distend it for lens placement.  A lens was then injected into the capsular bag.  Upon manipulation of the lens, a zonular dialysis was noted inferiorly.  A capsular tension ring was placed without difficulty in order to stabilize the capsular sac.     The remaining viscoelastic was aspirated.   Wounds were hydrated with balanced salt solution.  The anterior chamber was inflated to a physiologic pressure with balanced salt solution.  Intracameral vigamox 0.1 mL undiltued was injected into the eye and a drop placed onto the ocular surface.  No wound leaks were noted.  The patient was taken to the recovery room in stable condition without complications of anesthesia or surgery  Benay Pillow 01/19/2017, 9:35 AM

## 2017-01-19 NOTE — Discharge Instructions (Signed)
Eye Surgery Discharge Instructions  Expect mild scratchy sensation or mild soreness. DO NOT RUB YOUR EYE!  The day of surgery:  Minimal physical activity, but bed rest is not required  No reading, computer work, or close hand work  No bending, lifting, or straining.  May watch TV  For 24 hours:  No driving, legal decisions, or alcoholic beverages  Safety precautions  Eat anything you prefer: It is better to start with liquids, then soup then solid foods.  _____ Eye patch should be worn until postoperative exam tomorrow.  ____ Solar shield eyeglasses should be worn for comfort in the sunlight/patch while sleeping  Resume all regular medications including aspirin or Coumadin if these were discontinued prior to surgery. You may shower, bathe, shave, or wash your hair. Tylenol may be taken for mild discomfort.  Call your doctor if you experience significant pain, nausea, or vomiting, fever > 101 or other signs of infection. [phone] or [phone] Specific instructions:  Follow-up Information    Benay Pillow, MD Follow up.   Specialty:  Ophthalmology Why:  February 2 at 9:45am Contact information: [address] [place] [address] [phone]

## 2017-01-19 NOTE — Transfer of Care (Signed)
Immediate Anesthesia Transfer of Care Note  Patient: [name]  Procedure(s) Performed: Procedure(s) with comments: CATARACT EXTRACTION PHACO AND INTRAOCULAR LENS PLACEMENT ([facility]) (Left) - us00:58.3 ap%14.8 cde8.63 lot # [id number]  Patient Location: PACU  Anesthesia Type:MAC  Level of Consciousness: awake  Airway & Oxygen Therapy: Patient Spontanous Breathing  Post-op Assessment: Report given to RN and Post -op Vital signs reviewed and stable  Post vital signs: Reviewed and stable  Last Vitals:  Vitals:   01/19/17 0826  BP: (!) 123/59  Pulse: (!) 58  Resp: 18  Temp: (!) 35.9 C    Last Pain:  Vitals:   01/19/17 0826  TempSrc: Tympanic      Patients Stated Pain Goal: 0 (62/13/08 6578)  Complications: No apparent anesthesia complications

## 2017-01-19 NOTE — H&P (Signed)
The History and Physical notes are on paper, have been signed, and are to be scanned. The patient remains stable and unchanged from the H&P.   Previous H&P reviewed, patient examined, and there are no changes.  [name] 01/19/2017 8:48 AM

## 2017-01-19 NOTE — Anesthesia Post-op Follow-up Note (Cosign Needed)
Anesthesia QCDR form completed.        

## 2017-01-19 NOTE — Anesthesia Postprocedure Evaluation (Signed)
Anesthesia Post Note  Patient: [name]  Procedure(s) Performed: Procedure(s) (LRB): CATARACT EXTRACTION PHACO AND INTRAOCULAR LENS PLACEMENT ([place]) (Left)  Patient location during evaluation: Short Stay Anesthesia Type: MAC Level of consciousness: awake Pain management: pain level controlled Vital Signs Assessment: post-procedure vital signs reviewed and stable Respiratory status: spontaneous breathing Cardiovascular status: blood pressure returned to baseline Postop Assessment: no headache Anesthetic complications: no     Last Vitals:  Vitals:   01/19/17 0826  BP: (!) 123/59  Pulse: (!) 58  Resp: 18  Temp: (!) 35.9 C    Last Pain:  Vitals:   01/19/17 0826  TempSrc: Tympanic                 [name]

## 2017-01-19 NOTE — Anesthesia Preprocedure Evaluation (Signed)
Anesthesia Evaluation  Patient identified by MRN, date of birth, ID band Patient awake    Reviewed: Allergy & Precautions, NPO status , Patient's Chart, lab work & pertinent test results, reviewed documented beta blocker date and time   Airway Mallampati: II  TM Distance: >3 FB     Dental  (+) Chipped   Pulmonary COPD, Current Smoker,           Cardiovascular hypertension, Pt. on medications and Pt. on home beta blockers + Orthopnea and + DOE       Neuro/Psych    GI/Hepatic GERD  Controlled,  Endo/Other    Renal/GU      Musculoskeletal   Abdominal   Peds  Hematology   Anesthesia Other Findings   Reproductive/Obstetrics                             Anesthesia Physical Anesthesia Plan  ASA: III  Anesthesia Plan: MAC   Post-op Pain Management:    Induction:   Airway Management Planned:   Additional Equipment:   Intra-op Plan:   Post-operative Plan:   Informed Consent: I have reviewed the patients History and Physical, chart, labs and discussed the procedure including the risks, benefits and alternatives for the proposed anesthesia with the patient or authorized representative who has indicated his/her understanding and acceptance.     Plan Discussed with: CRNA  Anesthesia Plan Comments:         Anesthesia Quick Evaluation

## 2017-01-19 NOTE — Anesthesia Procedure Notes (Signed)
Date/Time: 01/19/2017 9:04 AM Performed by: Nelda Marseille Pre-anesthesia Checklist: Patient identified, Emergency Drugs available, Suction available, Patient being monitored and Timeout performed Oxygen Delivery Method: Nasal cannula

## 2017-01-24 ENCOUNTER — Encounter: Payer: Self-pay | Admitting: Emergency Medicine

## 2017-01-24 ENCOUNTER — Emergency Department: Payer: Medicare HMO

## 2017-01-24 ENCOUNTER — Observation Stay: Payer: Medicare HMO

## 2017-01-24 ENCOUNTER — Inpatient Hospital Stay
Admission: EM | Admit: 2017-01-24 | Discharge: 2017-01-26 | DRG: 640 | Disposition: A | Discharge status: Home / Self Care | Payer: Medicare HMO | Attending: Internal Medicine | Admitting: Internal Medicine

## 2017-01-24 DIAGNOSIS — E785 Hyperlipidemia, unspecified: Secondary | ICD-10-CM | POA: Diagnosis present

## 2017-01-24 DIAGNOSIS — J9601 Acute respiratory failure with hypoxia: Secondary | ICD-10-CM | POA: Diagnosis present

## 2017-01-24 DIAGNOSIS — Z882 Allergy status to sulfonamides status: Secondary | ICD-10-CM

## 2017-01-24 DIAGNOSIS — R69 Illness, unspecified: Secondary | ICD-10-CM | POA: Diagnosis not present

## 2017-01-24 DIAGNOSIS — I959 Hypotension, unspecified: Secondary | ICD-10-CM | POA: Diagnosis present

## 2017-01-24 DIAGNOSIS — J439 Emphysema, unspecified: Secondary | ICD-10-CM | POA: Diagnosis present

## 2017-01-24 DIAGNOSIS — K219 Gastro-esophageal reflux disease without esophagitis: Secondary | ICD-10-CM | POA: Diagnosis present

## 2017-01-24 DIAGNOSIS — E876 Hypokalemia: Secondary | ICD-10-CM | POA: Diagnosis not present

## 2017-01-24 DIAGNOSIS — N390 Urinary tract infection, site not specified: Secondary | ICD-10-CM | POA: Diagnosis not present

## 2017-01-24 DIAGNOSIS — H9190 Unspecified hearing loss, unspecified ear: Secondary | ICD-10-CM | POA: Diagnosis present

## 2017-01-24 DIAGNOSIS — R410 Disorientation, unspecified: Secondary | ICD-10-CM

## 2017-01-24 DIAGNOSIS — M810 Age-related osteoporosis without current pathological fracture: Secondary | ICD-10-CM | POA: Diagnosis present

## 2017-01-24 DIAGNOSIS — R2681 Unsteadiness on feet: Secondary | ICD-10-CM

## 2017-01-24 DIAGNOSIS — B20 Human immunodeficiency virus [HIV] disease: Secondary | ICD-10-CM | POA: Diagnosis present

## 2017-01-24 DIAGNOSIS — M6281 Muscle weakness (generalized): Secondary | ICD-10-CM

## 2017-01-24 DIAGNOSIS — Z7982 Long term (current) use of aspirin: Secondary | ICD-10-CM

## 2017-01-24 DIAGNOSIS — R4182 Altered mental status, unspecified: Secondary | ICD-10-CM | POA: Diagnosis present

## 2017-01-24 DIAGNOSIS — E871 Hypo-osmolality and hyponatremia: Principal | ICD-10-CM

## 2017-01-24 DIAGNOSIS — J9600 Acute respiratory failure, unspecified whether with hypoxia or hypercapnia: Secondary | ICD-10-CM | POA: Diagnosis not present

## 2017-01-24 DIAGNOSIS — R269 Unspecified abnormalities of gait and mobility: Secondary | ICD-10-CM | POA: Diagnosis not present

## 2017-01-24 DIAGNOSIS — Z880 Allergy status to penicillin: Secondary | ICD-10-CM

## 2017-01-24 DIAGNOSIS — R0902 Hypoxemia: Secondary | ICD-10-CM | POA: Diagnosis not present

## 2017-01-24 DIAGNOSIS — I10 Essential (primary) hypertension: Secondary | ICD-10-CM | POA: Diagnosis present

## 2017-01-24 DIAGNOSIS — R0602 Shortness of breath: Secondary | ICD-10-CM | POA: Diagnosis not present

## 2017-01-24 DIAGNOSIS — F17200 Nicotine dependence, unspecified, uncomplicated: Secondary | ICD-10-CM | POA: Diagnosis present

## 2017-01-24 DIAGNOSIS — R05 Cough: Secondary | ICD-10-CM | POA: Diagnosis not present

## 2017-01-24 LAB — COMPREHENSIVE METABOLIC PANEL
ALBUMIN: 4 g/dL (ref 3.5–5.0)
ALT: 12 U/L — ABNORMAL LOW (ref 14–54)
ANION GAP: 12 (ref 5–15)
AST: 31 U/L (ref 15–41)
Alkaline Phosphatase: 44 U/L (ref 38–126)
BILIRUBIN TOTAL: 1 mg/dL (ref 0.3–1.2)
BUN: 12 mg/dL (ref 6–20)
CHLORIDE: 78 mmol/L — AB (ref 101–111)
CO2: 24 mmol/L (ref 22–32)
Calcium: 9.1 mg/dL (ref 8.9–10.3)
Creatinine, Ser: 0.72 mg/dL (ref 0.44–1.00)
GFR calc Af Amer: >60 mL/min (ref >60)
GFR calc non Af Amer: >60 mL/min (ref >60)
GLUCOSE: 126 mg/dL — AB (ref 65–99)
POTASSIUM: 3.2 mmol/L — AB (ref 3.5–5.1)
SODIUM: 114 mmol/L — AB (ref 135–145)
TOTAL PROTEIN: 6.9 g/dL (ref 6.5–8.1)

## 2017-01-24 LAB — CBC
HEMATOCRIT: 38.3 % (ref 35.0–47.0)
Hemoglobin: 14.1 g/dL (ref 12.0–16.0)
MCH: 34.6 pg — ABNORMAL HIGH (ref 26.0–34.0)
MCHC: 36.8 g/dL — AB (ref 32.0–36.0)
MCV: 93.9 fL (ref 80.0–100.0)
PLATELETS: 267 10*3/uL (ref 150–440)
RBC: 4.08 MIL/uL (ref 3.80–5.20)
RDW: 13.9 % (ref 11.5–14.5)
WBC: 8.9 10*3/uL (ref 3.6–11.0)

## 2017-01-24 LAB — SODIUM
Sodium: 115 mmol/L — CL (ref 135–145)
Sodium: 118 mmol/L — CL (ref 135–145)
Sodium: 118 mmol/L — CL (ref 135–145)
Sodium: 118 mmol/L — CL (ref 135–145)

## 2017-01-24 LAB — CORTISOL: CORTISOL PLASMA: 21 ug/dL

## 2017-01-24 LAB — URINALYSIS, COMPLETE (UACMP) WITH MICROSCOPIC
Bilirubin Urine: NEGATIVE
GLUCOSE, UA: NEGATIVE mg/dL
Ketones, ur: NEGATIVE mg/dL
Nitrite: NEGATIVE
PH: 7 (ref 5.0–8.0)
Protein, ur: NEGATIVE mg/dL
SPECIFIC GRAVITY, URINE: 1.009 (ref 1.005–1.030)

## 2017-01-24 LAB — TSH: TSH: 1.2 u[IU]/mL (ref 0.350–4.500)

## 2017-01-24 LAB — TROPONIN I

## 2017-01-24 LAB — OSMOLALITY, URINE: OSMOLALITY UR: 269 mosm/kg — AB (ref 300–900)

## 2017-01-24 MED ORDER — ONDANSETRON HCL 4 MG/2ML IJ SOLN: 4.0000 mg | Freq: Four times a day (QID) | INTRAMUSCULAR | Status: DC | PRN

## 2017-01-24 MED ORDER — ASPIRIN EC 81 MG PO TBEC
81.0000 mg | DELAYED_RELEASE_TABLET | Freq: Every day | ORAL | Status: DC
  Administered 2017-01-24 – 2017-01-26 (×3): 81 mg via ORAL
  Filled 2017-01-24 (×3): qty 1

## 2017-01-24 MED ORDER — OMEGA-3-ACID ETHYL ESTERS 1 G PO CAPS
1.0000 g | ORAL_CAPSULE | Freq: Every day | ORAL | Status: DC
  Administered 2017-01-24 – 2017-01-26 (×3): 1 g via ORAL
  Filled 2017-01-24 (×3): qty 1

## 2017-01-24 MED ORDER — SODIUM CHLORIDE 0.9 % IV SOLN
Freq: Once | INTRAVENOUS | Status: AC
  Administered 2017-01-24: 07:00:00 via INTRAVENOUS

## 2017-01-24 MED ORDER — CARVEDILOL 6.25 MG PO TABS
6.2500 mg | ORAL_TABLET | Freq: Two times a day (BID) | ORAL | Status: DC
  Administered 2017-01-24 (×2): 6.25 mg via ORAL
  Filled 2017-01-24 (×2): qty 1

## 2017-01-24 MED ORDER — ENOXAPARIN SODIUM 30 MG/0.3ML ~~LOC~~ SOLN
30.0000 mg | SUBCUTANEOUS | Status: DC
  Administered 2017-01-24: 30 mg via SUBCUTANEOUS
  Filled 2017-01-24: qty 0.3

## 2017-01-24 MED ORDER — ACETAMINOPHEN 325 MG PO TABS: 650.0000 mg | ORAL_TABLET | Freq: Four times a day (QID) | ORAL | Status: DC | PRN

## 2017-01-24 MED ORDER — ONDANSETRON HCL 4 MG/2ML IJ SOLN
4.0000 mg | Freq: Once | INTRAMUSCULAR | Status: AC
  Administered 2017-01-24: 4 mg via INTRAVENOUS
  Filled 2017-01-24: qty 2

## 2017-01-24 MED ORDER — PANTOPRAZOLE SODIUM 40 MG PO TBEC
40.0000 mg | DELAYED_RELEASE_TABLET | Freq: Every day | ORAL | Status: DC
  Administered 2017-01-24 – 2017-01-26 (×3): 40 mg via ORAL
  Filled 2017-01-24 (×3): qty 1

## 2017-01-24 MED ORDER — LOSARTAN POTASSIUM 50 MG PO TABS
100.0000 mg | ORAL_TABLET | Freq: Every day | ORAL | Status: DC
  Administered 2017-01-24: 19:00:00 100 mg via ORAL
  Filled 2017-01-24: qty 2

## 2017-01-24 MED ORDER — DOCUSATE SODIUM 100 MG PO CAPS
100.0000 mg | ORAL_CAPSULE | Freq: Two times a day (BID) | ORAL | Status: DC
  Administered 2017-01-24 – 2017-01-26 (×5): 100 mg via ORAL
  Filled 2017-01-24 (×5): qty 1

## 2017-01-24 MED ORDER — ACETAMINOPHEN 650 MG RE SUPP: 650.0000 mg | Freq: Four times a day (QID) | RECTAL | Status: DC | PRN

## 2017-01-24 MED ORDER — SODIUM CHLORIDE 0.9 % IV BOLUS (SEPSIS)
1000.0000 mL | Freq: Once | INTRAVENOUS | Status: AC
Start: 2017-01-24 — End: 2017-01-24
  Administered 2017-01-24: 1000 mL via INTRAVENOUS

## 2017-01-24 MED ORDER — PRAVASTATIN SODIUM 40 MG PO TABS
40.0000 mg | ORAL_TABLET | Freq: Every day | ORAL | Status: DC
  Administered 2017-01-24 – 2017-01-25 (×2): 40 mg via ORAL
  Filled 2017-01-24 (×2): qty 1

## 2017-01-24 MED ORDER — OXYBUTYNIN CHLORIDE 5 MG PO TABS
5.0000 mg | ORAL_TABLET | Freq: Two times a day (BID) | ORAL | Status: DC
  Administered 2017-01-24 – 2017-01-26 (×5): 5 mg via ORAL
  Filled 2017-01-24 (×5): qty 1

## 2017-01-24 MED ORDER — ONDANSETRON HCL 4 MG PO TABS: 4.0000 mg | ORAL_TABLET | Freq: Four times a day (QID) | ORAL | Status: DC | PRN

## 2017-01-24 MED ORDER — POTASSIUM CHLORIDE IN NACL 20-0.9 MEQ/L-% IV SOLN
INTRAVENOUS | Status: DC
  Administered 2017-01-24 – 2017-01-26 (×4): via INTRAVENOUS
  Filled 2017-01-24 (×5): qty 1000

## 2017-01-24 MED ORDER — IOPAMIDOL (ISOVUE-370) INJECTION 76%
75.0000 mL | Freq: Once | INTRAVENOUS | Status: AC | PRN
  Administered 2017-01-24: 75 mL via INTRAVENOUS

## 2017-01-24 NOTE — Plan of Care (Signed)
Problem: Physical Regulation: Goal: Ability to maintain clinical measurements within normal limits will improve Outcome: Progressing Pt admitted today from the ED. A&O to person, place and time. Disoriented to situation. Poor attention/concentration. Sodium serum level 118, improving. IVF infusing.

## 2017-01-24 NOTE — H&P (Addendum)
[facility] NAME: [name]    MR#:  [id number]  DATE OF BIRTH:  11-15-1933  DATE OF ADMISSION:  01/24/2017  PRIMARY CARE PHYSICIAN: [name], MD   REQUESTING/REFERRING PHYSICIAN:   CHIEF COMPLAINT:   Chief Complaint  Patient presents with  . Altered Mental Status    HISTORY OF PRESENT ILLNESS: [name]  is a 83 y.o. female with a known history of COPD, ongoing tobacco abuse, hyperlipidemia, hypertension, who presents to the hospital with complaints of not feeling well for the past 1-1/2 days, feeling weak, restless, dyspneic. She woke her daughter earlier today because she felt that she needed oxygen. She was also more confused over the past one to 2 days. On arrival to emergency room, her O2 sats were found to be low at 88, her labs revealed a sodium level of 114. Hospitalist services were contacted for admission. Urinalysis revealed some pyuria, patient denies any dysuria symptoms  PAST MEDICAL HISTORY:   Past Medical History:  Diagnosis Date  . Cardiac dysrhythmia, unspecified    Bradycardia  . Clavicular fracture    History of   . COPD (chronic obstructive pulmonary disease) ([place])   . Cough    CHRONIC  . Gastroesophageal reflux disease   . Generalized and unspecified atherosclerosis   . HOH (hard of hearing)    AIDS  . Hyperlipidemia   . Hypertension   . Orthopnea   . Osteoporosis   . Tobacco abuse     PAST SURGICAL HISTORY: Past Surgical History:  Procedure Laterality Date  . ABDOMINAL HYSTERECTOMY    . APPENDECTOMY  20+ years ago  . BACK SURGERY  2 years ago   x2  . Cardiac Event Monitor  February 2015   Sinus rhythm sinus bradycardia rates of 46-80 beats a minute. No other arrhythmias; occasional PACs with some being blocked., Occasional first-degree AV block.  Marland Kitchen CATARACT EXTRACTION W/PHACO Left 01/19/2017   Procedure: CATARACT EXTRACTION PHACO AND INTRAOCULAR LENS PLACEMENT (IOC);   Surgeon: Eulogio Bear, MD;  Location: [facility] ORS;  Service: Ophthalmology;  Laterality: Left;  us00:58.3 ap%14.8 cde8.63 lot # [id number]  . TRANSTHORACIC ECHOCARDIOGRAM  01/21/2014   Normal LV size and function. No regional wall motion abnormalities. Mild aortic sclerosis with trivial regurgitation.  . VESICOVAGINAL FISTULA CLOSURE W/ TAH  20+ years ago    SOCIAL HISTORY:  Social History  Substance Use Topics  . Smoking status: Current Every Day Smoker    Packs/day: 1.50    Years: 50.00  . Smokeless tobacco: Never Used  . Alcohol use No    FAMILY HISTORY:  Family History  Problem Relation Age of Onset  . Heart attack Sister   . Cancer Brother     DRUG ALLERGIES:  Allergies  Allergen Reactions  . Penicillins Other (See Comments)    Patient unsure of what reaction was. Has patient had a PCN reaction causing immediate rash, facial/tongue/throat swelling, SOB or lightheadedness with hypotension: unknown Has patient had a PCN reaction causing severe rash involving mucus membranes or skin necrosis: uknown Has patient had a PCN reaction that required hospitalization: unknown Has patient had a PCN reaction occurring within the last 10 years: No If all of the above answers are "NO", then may proceed with Cephalosporin use.   Francine Graven Other (See Comments)    Unknown reaction. Last used long time ago. Quit taking,.    Review of Systems  Constitutional: Positive for malaise/fatigue. Negative  for chills, fever and weight loss.  HENT: Negative for congestion.   Eyes: Negative for blurred vision and double vision.  Respiratory: Positive for cough and shortness of breath. Negative for sputum production and wheezing.   Cardiovascular: Negative for chest pain, palpitations, orthopnea, leg swelling and PND.  Gastrointestinal: Negative for abdominal pain, blood in stool, constipation, diarrhea, nausea and vomiting.  Genitourinary: Positive for urgency. Negative for dysuria,  frequency and hematuria.  Musculoskeletal: Negative for falls.  Neurological: Negative for dizziness, tremors, focal weakness and headaches.  Endo/Heme/Allergies: Does not bruise/bleed easily.  Psychiatric/Behavioral: Negative for depression. The patient does not have insomnia.     MEDICATIONS AT HOME:  Prior to Admission medications   Medication Sig Start Date End Date Taking? Authorizing Provider  aspirin EC 81 MG tablet Take 81 mg by mouth daily.   Yes Historical Provider, MD  Aspirin-Salicylamide-Caffeine (BC HEADACHE POWDER PO) Take 1 packet by mouth 2 (two) times daily.   Yes Historical Provider, MD  carvedilol (COREG) 6.25 MG tablet TAKE ONE TABLET BY MOUTH TWICE DAILY 08/17/15  Yes Leonie Man, MD  cholecalciferol (VITAMIN D) 1000 units tablet Take 1,000 Units by mouth daily.   Yes Historical Provider, MD  hydrochlorothiazide (HYDRODIURIL) 12.5 MG tablet Take 12.5 mg by mouth daily.   Yes Historical Provider, MD  losartan (COZAAR) 100 MG tablet Take 100 mg by mouth daily. 12/27/16  Yes Historical Provider, MD  Omega-3 Fatty Acids (FISH OIL) 1000 MG CAPS Take 1 capsule by mouth daily.   Yes Historical Provider, MD  omeprazole (PRILOSEC) 20 MG capsule Take 20 mg by mouth daily.   Yes Historical Provider, MD  oxybutynin (DITROPAN) 5 MG tablet Take 5 mg by mouth 2 (two) times daily. 12/26/16  Yes Historical Provider, MD  pravastatin (PRAVACHOL) 40 MG tablet Take 40 mg by mouth daily.   Yes Historical Provider, MD      PHYSICAL EXAMINATION:   VITAL SIGNS: Blood pressure (!) 150/78, pulse 62, temperature 97.8 F (36.6 C), temperature source Oral, resp. rate 18, height 5' (1.524 m), weight 43.5 kg (96 lb), SpO2 96 %.  GENERAL:  83 y.o.-year-old patient lying in the bed with no acute distress.  EYES: Pupils equal, round, reactive to light and accommodation. No scleral icterus. Extraocular muscles intact.  HEENT: Head atraumatic, normocephalic. Oropharynx and nasopharynx clear.  NECK:   Supple, no jugular venous distention. No thyroid enlargement, no tenderness.  LUNGS: Normal breath sounds bilaterally, no wheezing, rales,rhonchi or crepitation. No use of accessory muscles of respiration.  CARDIOVASCULAR: S1, S2 normal. No murmurs, rubs, or gallops.  ABDOMEN: Soft, nontender, nondistended. Bowel sounds present. No organomegaly or mass.  EXTREMITIES: Trace lower extremity and pedal edema, no cyanosis, or clubbing.  NEUROLOGIC: Cranial nerves II through XII are intact. Muscle strength 5/5 in all extremities. Sensation intact. Gait not checked.  PSYCHIATRIC: The patient is alert and oriented x 3.  SKIN: No obvious rash, lesion, or ulcer.   LABORATORY PANEL:   CBC  Recent Labs Lab 01/24/17 0427  WBC 8.9  HGB 14.1  HCT 38.3  PLT 267  MCV 93.9  MCH 34.6*  MCHC 36.8*  RDW 13.9   ------------------------------------------------------------------------------------------------------------------  Chemistries   Recent Labs Lab 01/24/17 0427  NA 114*  K 3.2*  CL 78*  CO2 24  GLUCOSE 126*  BUN 12  CREATININE 0.72  CALCIUM 9.1  AST 31  ALT 12*  ALKPHOS 44  BILITOT 1.0   ------------------------------------------------------------------------------------------------------------------  Cardiac Enzymes  Recent Labs Lab  01/24/17 0427  TROPONINI <0.03   ------------------------------------------------------------------------------------------------------------------  RADIOLOGY: Dg Chest 2 View  Result Date: 01/24/2017 CLINICAL DATA:  Patient woke up with confusion. Productive cough and shortness of breath. EXAM: CHEST  2 VIEW COMPARISON:  02/15/2007 FINDINGS: Normal heart size and pulmonary vascularity. Emphysematous changes with scattered fibrosis in the lungs. Calcified hilar lymph nodes. Interstitial changes in the bases likely representing fibrosis. No focal airspace disease or consolidation. No blunting of costophrenic angles. No pneumothorax. Mediastinal  contours appear intact. Calcification of the aorta. Calcifications in the left upper quadrant consistent with splenic granulomas. Mild degenerative changes in the spine. IMPRESSION: Emphysematous changes in the lungs. No evidence of active pulmonary disease. Electronically Signed   By: Lucienne Capers M.D.   On: 01/24/2017 04:55   Ct Head Wo Contrast  Result Date: 01/24/2017 CLINICAL DATA:  83 y/o  F; confusion. EXAM: CT HEAD WITHOUT CONTRAST TECHNIQUE: Contiguous axial images were obtained from the base of the skull through the vertex without intravenous contrast. COMPARISON:  None. FINDINGS: Brain: No evidence of acute infarction, hemorrhage, hydrocephalus, extra-axial collection or mass lesion/mass effect. Few scattered foci of hypoattenuation in subcortical white matter and right hemi pons probably represent mild chronic microvascular ischemic changes. Mild brain parenchymal volume loss. Vascular: Mild calcific atherosclerosis of cavernous internal carotid arteries. Skull: Normal. Negative for fracture or focal lesion. Sinuses/Orbits: No acute finding. Left intra-ocular lens replacement. Other: None. IMPRESSION: 1. No acute intracranial abnormality identified. 2. Mild chronic microvascular ischemic changes and parenchymal volume loss of the brain. Electronically Signed   By: Kristine Garbe M.D.   On: 01/24/2017 06:17    EKG: Orders placed or performed during the hospital encounter of 01/24/17  . EKG 12-Lead  . EKG 12-Lead  . ED EKG  . ED EKG  . EKG 12-Lead  . EKG 12-Lead  EKG in the emergency room revealed sinus rhythm at 59 bpm by atrial enlargement, nonspecific intraventricular conduction delay, no acute ST-T changes  IMPRESSION AND PLAN:  Active Problems:   Hyponatremia   Hypokalemia   UTI (urinary tract infection)   Confusion   Hypoxia  #1. Hyponatremia, likely aseptic is related, questionable poor oral intake, hold HCTZ, initiate patient on normal saline infusion, follow  sodium level every 4 hours, get urine osmolarity, TSH and cortisol level to rule out SIADH #2. Pyuria, questionable urinary tract infection, get urine cultures, initiate antibiotic therapy if cultures are positive, follow clinically #3. Hypokalemia, supplement intravenously #4. Confusion, likely hyponatremia related, CT of head showed no acute changes, supportive therapy #5. Hypoxia, chest x-ray was unremarkable, get CT angiogram of the chest to rule out pulmonary embolism, although patient's chest x-ray revealed emphysema, which may be the cause for hypoxia, continue oxygen therapy, may need to be on chronic oxygen therapy at home  All the records are reviewed and case discussed with ED provider. Management plans discussed with the patient, family and they are in agreement.  CODE STATUS: Code Status History    This patient does not have a recorded code status. Please follow your organizational policy for patients in this situation.    Advance Directive Documentation   Flowsheet Row Most Recent Value  Type of Advance Directive  Healthcare Power of Attorney  Pre-existing out of facility DNR order (yellow form or pink MOST form)  No data  "MOST" Form in Place?  No data       TOTAL TIME TAKING CARE OF THIS PATIENT: 50 minutes.  Discussed with patient's daughter, all questions were  answered  Ripley Lovecchio M.D on 01/24/2017 at 8:08 AM  Between 7am to 6pm - Pager - [phone] After 6pm go to [email or web address] - password [email or web address] [facility]  Office  [phone]  CC: Primary care physician; [name], MD

## 2017-01-24 NOTE — Evaluation (Signed)
Physical Therapy Evaluation Patient Details Name: ARCILIA AEBERSOLD MRN: [id number] DOB: [birth year] Today's Date: 01/24/2017   History of Present Illness  Pt is an 83 y.o. female presenting to hospital with AMS, feeling weak, restless, and dyspneic.  Pt admitted with hyponatremia, pyuria, hypokalemia, confusion, and hypoxia.  PMH includes bradycardia, h/o clavicular fx, COPD, chronic cough, HOH, htn, and back surgery x2.  Clinical Impression  Prior to hospital admission, pt reports ambulating with SPC.  Pt lives with her daughter in 1 level home with steps to enter.  Currently pt is SBA sit to supine, CGA with transfers, and CGA with short ambulation in room with B UE support.  Pt would benefit from skilled PT to address noted impairments and functional limitations.  Recommend pt discharge to home with HHPT and use of RW when medically appropriate.    Follow Up Recommendations Home health PT;Supervision for mobility/OOB    Equipment Recommendations  Rolling walker with 5" wheels    Recommendations for Other Services       Precautions / Restrictions Precautions Precautions: Fall Restrictions Weight Bearing Restrictions: No      Mobility  Bed Mobility Overal bed mobility: Needs Assistance Bed Mobility: Sit to Supine       Sit to supine: Supervision;HOB elevated   General bed mobility comments: no difficulty noted  Transfers Overall transfer level: Needs assistance Equipment used: None Transfers: Sit to/from Stand Sit to Stand: Min guard         General transfer comment: posterior lean noted initially but pt able to correct with UE support on IV pole  Ambulation/Gait Ambulation/Gait assistance: Min guard Ambulation Distance (Feet): 40 Feet Assistive device:  (B UE support on IV pole) Gait Pattern/deviations: Step-through pattern Gait velocity: decreased   General Gait Details: mildly unsteady at times but no overt loss of balance noted  Stairs             Wheelchair Mobility    Modified Rankin (Stroke Patients Only)       Balance Overall balance assessment: Needs assistance Sitting-balance support: Bilateral upper extremity supported;Feet supported Sitting balance-Leahy Scale: Good     Standing balance support: Bilateral upper extremity supported;During functional activity (B UE support on IV pole) Standing balance-Leahy Scale: Good Standing balance comment: during ambulation                             Pertinent Vitals/Pain Pain Assessment: No/denies pain  Vitals stable and WFL throughout treatment session.    Home Living Family/patient expects to be discharged to:: Private residence Living Arrangements: Children (Pt's daughter) Available Help at Discharge: Family (Daughter works) Type of Home: House Home Access: Stairs to enter Entrance Stairs-Rails: None Technical brewer of Steps: 2-3 Home Layout: One level Home Equipment: Kasandra Knudsen - single point (pt reports owning a walker but not sure what kind)      Prior Function Level of Independence: Independent with assistive device(s)         Comments: Pt reports using SPC in house and community but tended to walk shorter distances.  Pt denies any falls in past 6 months.     Hand Dominance        Extremity/Trunk Assessment   Upper Extremity Assessment Upper Extremity Assessment: Generalized weakness    Lower Extremity Assessment Lower Extremity Assessment: Generalized weakness       Communication   Communication: HOH  Cognition Arousal/Alertness: Awake/alert Behavior During Therapy: WFL for tasks assessed/performed Overall  Cognitive Status: Within Functional Limits for tasks assessed                      General Comments   Nursing cleared pt for participation in physical therapy.  Pt agreeable to PT session.    Exercises     Assessment/Plan    PT Assessment Patient needs continued PT services  PT Problem List Decreased  strength;Decreased balance          PT Treatment Interventions DME instruction;Gait training;Stair training;Functional mobility training;Therapeutic activities;Therapeutic exercise;Balance training;Patient/family education    PT Goals (Current goals can be found in the Care Plan section)  Acute Rehab PT Goals Patient Stated Goal: to go home PT Goal Formulation: With patient Time For Goal Achievement: 02/07/17 Potential to Achieve Goals: Good    Frequency Min 2X/week   Barriers to discharge        Co-evaluation               End of Session Equipment Utilized During Treatment: Gait belt;Oxygen Activity Tolerance: Patient tolerated treatment well Patient left: in bed;with call bell/phone within reach;with bed alarm set Nurse Communication: Mobility status;Precautions         Time: [id number] PT Time Calculation (min) (ACUTE ONLY): 18 min   Charges:   PT Evaluation $PT Eval Low Complexity: 1 Procedure     PT G CodesLeitha Bleak 02/06/17, 4:56 PM Leitha Bleak, [place]

## 2017-01-24 NOTE — ED Provider Notes (Signed)
[facility] Emergency Department Provider Note   ____________________________________________   First MD Initiated Contact with Patient 01/24/17 [phone]     (approximate)  I have reviewed the triage vital signs and the nursing notes.   HISTORY  Chief Complaint Altered Mental Status    HPI CHRISTE [name] is a 83 y.o. female whocomes into the hospital today not feeling well. The patient currently is complaining that she has left arm pain by her daughter reports that she didn't complain of that and so she came into the room. She told her daughter that she had taken too much of her medication earlier today but her daughter reports that she didn't take any of her medication. The patient didn't eat well today and has been drinking a lot according to her daughter. The patient was stating her arms and her legs were weak but was very agitated and getting up and down. The patient has not had any fevers but did wake her daughter up tonight saying that she felt like she needed some oxygen. The patient's daughter sided to bring her into the hospital for evaluation. The patient did have cataract surgery on Thursday and had been doing well up until today. The patient's daughter is concerned she decided to bring her into the hospital for evaluation.   Past Medical History:  Diagnosis Date  . Cardiac dysrhythmia, unspecified    Bradycardia  . Clavicular fracture    History of   . COPD (chronic obstructive pulmonary disease) ([place])   . Cough    CHRONIC  . Gastroesophageal reflux disease   . Generalized and unspecified atherosclerosis   . HOH (hard of hearing)    AIDS  . Hyperlipidemia   . Hypertension   . Orthopnea   . Osteoporosis   . Tobacco abuse     Patient Active Problem List   Diagnosis Date Noted  . Bruit of right carotid artery; normal dopplers 08/08/2014  . Tobacco abuse   . Abnormal EKG 01/17/2014  . DOE (dyspnea on exertion) 01/17/2014  . Bradycardia  01/17/2014  . Essential hypertension   . Hyperlipidemia with target LDL less than 130     Past Surgical History:  Procedure Laterality Date  . ABDOMINAL HYSTERECTOMY    . APPENDECTOMY  20+ years ago  . BACK SURGERY  2 years ago   x2  . Cardiac Event Monitor  February 2015   Sinus rhythm sinus bradycardia rates of 46-80 beats a minute. No other arrhythmias; occasional PACs with some being blocked., Occasional first-degree AV block.  Marland Kitchen CATARACT EXTRACTION W/PHACO Left 01/19/2017   Procedure: CATARACT EXTRACTION PHACO AND INTRAOCULAR LENS PLACEMENT (IOC);  Surgeon: Eulogio Bear, MD;  Location: [facility] ORS;  Service: Ophthalmology;  Laterality: Left;  us00:58.3 ap%14.8 cde8.63 lot # [id number]  . TRANSTHORACIC ECHOCARDIOGRAM  01/21/2014   Normal LV size and function. No regional wall motion abnormalities. Mild aortic sclerosis with trivial regurgitation.  . VESICOVAGINAL FISTULA CLOSURE W/ TAH  20+ years ago    Prior to Admission medications   Medication Sig Start Date End Date Taking? Authorizing Provider  aspirin EC 81 MG tablet Take 81 mg by mouth daily.    Historical Provider, MD  Aspirin-Salicylamide-Caffeine (BC HEADACHE POWDER PO) Take 1 packet by mouth 2 (two) times daily.    Historical Provider, MD  carvedilol (COREG) 6.25 MG tablet TAKE ONE TABLET BY MOUTH TWICE DAILY 08/17/15   Leonie Man, MD  cholecalciferol (VITAMIN D) 1000 units tablet Take 1,000  Units by mouth daily.    Historical Provider, MD  hydrochlorothiazide (HYDRODIURIL) 12.5 MG tablet Take 12.5 mg by mouth daily.    Historical Provider, MD  losartan (COZAAR) 100 MG tablet Take 100 mg by mouth daily. 12/27/16   Historical Provider, MD  Omega-3 Fatty Acids (FISH OIL) 1000 MG CAPS Take 1 capsule by mouth daily.    Historical Provider, MD  omeprazole (PRILOSEC) 20 MG capsule Take 20 mg by mouth daily.    Historical Provider, MD  oxybutynin (DITROPAN) 5 MG tablet Take 5 mg by mouth 2 (two) times daily. 12/26/16    Historical Provider, MD  pravastatin (PRAVACHOL) 40 MG tablet Take 40 mg by mouth daily.    Historical Provider, MD    Allergies Penicillins and Sulfamethizole  Family History  Problem Relation Age of Onset  . Heart attack Sister   . Cancer Brother     Social History Social History  Substance Use Topics  . Smoking status: Current Every Day Smoker    Packs/day: 1.50    Years: 50.00  . Smokeless tobacco: Never Used  . Alcohol use No    Review of Systems Constitutional: No fever/chills Eyes: No visual changes. ENT: No sore throat. Cardiovascular: Denies chest pain. Respiratory: Denies shortness of breath. Gastrointestinal: No abdominal pain.  No nausea, no vomiting.  No diarrhea.  No constipation. Genitourinary: Negative for dysuria. Musculoskeletal: Negative for back pain. Skin: Negative for rash. Neurological: Confusion and generalized weakness  10-point ROS otherwise negative.  ____________________________________________   PHYSICAL EXAM:  VITAL SIGNS: ED Triage Vitals  Enc Vitals Group     BP 01/24/17 0403 (!) 212/94     Pulse Rate 01/24/17 0403 65     Resp 01/24/17 0403 18     Temp 01/24/17 0403 97.8 F (36.6 C)     Temp Source 01/24/17 0403 Oral     SpO2 01/24/17 0403 98 %     Weight 01/24/17 0409 96 lb (43.5 kg)     Height 01/24/17 0409 5' (1.524 m)     Head Circumference --      Peak Flow --      Pain Score 01/24/17 0433 0     Pain Loc --      Pain Edu? --      Excl. in [place]? --     Constitutional: Alert With some slight confusion. Well appearing and in mild distress. Eyes: Conjunctivae are normal. PERRL. EOMI. Head: Atraumatic. Nose: No congestion/rhinnorhea. Mouth/Throat: Mucous membranes are moist.  Oropharynx non-erythematous. Cardiovascular: Normal rate, regular rhythm. Grossly normal heart sounds.  Good peripheral circulation. Respiratory: Normal respiratory effort.  No retractions. Lungs CTAB. Gastrointestinal: Soft and nontender. No  distention. Positive bowel sounds Musculoskeletal: No lower extremity tenderness nor edema.   Neurologic:  Normal speech and language.  Skin:  Skin is warm, dry and intact.  Psychiatric: Mood and affect are normal.   ____________________________________________   LABS (all labs ordered are listed, but only abnormal results are displayed)  Labs Reviewed  CBC - Abnormal; Notable for the following:       Result Value   MCH 34.6 (*)    MCHC 36.8 (*)    All other components within normal limits  COMPREHENSIVE METABOLIC PANEL - Abnormal; Notable for the following:    Sodium 114 (*)    Potassium 3.2 (*)    Chloride 78 (*)    Glucose, Bld 126 (*)    ALT 12 (*)    All other components within normal  limits  URINALYSIS, COMPLETE (UACMP) WITH MICROSCOPIC - Abnormal; Notable for the following:    Color, Urine YELLOW (*)    APPearance HAZY (*)    Hgb urine dipstick MODERATE (*)    Leukocytes, UA TRACE (*)    Bacteria, UA FEW (*)    Squamous Epithelial / LPF 0-5 (*)    All other components within normal limits  TROPONIN I   ____________________________________________  EKG  ED ECG REPORT I, Loney Hering, the attending physician, personally viewed and interpreted this ECG.   Date: 01/24/2017  EKG Time: 425  Rate: 59  Rhythm: normal sinus rhythm  Axis: normal  Intervals:none  ST&T Change: none  ____________________________________________  RADIOLOGY  CXR CT head ____________________________________________   PROCEDURES  Procedure(s) performed: None  Procedures  Critical Care performed: No  ____________________________________________   INITIAL IMPRESSION / ASSESSMENT AND PLAN / ED COURSE  Pertinent labs & imaging results that were available during my care of the patient were reviewed by me and considered in my medical decision making (see chart for details).  This is an 83 year old who comes into the hospital today with some confusion and altered mental  status. The patient's daughter was concerned so brought her into the hospital. The patient's chest x-ray and urine are unremarkable but the patient has sodium of 114. I will give the patient a liter of normal saline. She also received some Zofran for vomiting. Once I received the results of her CT scan the patient be admitted to the hospitalist service.  Clinical Course as of Jan 24 653  Tue Jan 24, 2017  0504 Emphysematous changes in the lungs. No evidence of active pulmonary disease.   DG Chest 2 View [AW]  [phone] 1. No acute intracranial abnormality identified. 2. Mild chronic microvascular ischemic changes and parenchymal volume loss of the brain.   CT Head Wo Contrast [AW]    Clinical Course User Index [AW] Loney Hering, MD   It was found that the patient was hyponatremic. I gave her a liter of normal saline and she will be admitted to the hospitalist service.  ____________________________________________   FINAL CLINICAL IMPRESSION(S) / ED DIAGNOSES  Final diagnoses:  Hyponatremia  Confusion      NEW MEDICATIONS STARTED DURING THIS VISIT:  New Prescriptions   No medications on file     Note:  This document was prepared using Dragon voice recognition software and may include unintentional dictation errors.    Loney Hering, MD 01/24/17 [phone]

## 2017-01-24 NOTE — ED Notes (Signed)
Pt placed on 2L via [place] per Hospitalist request.

## 2017-01-24 NOTE — ED Notes (Signed)
Attemped to call report.

## 2017-01-24 NOTE — ED Triage Notes (Signed)
Pt presents to ED with daughter with c/o confusion since last night. Daughter reports pt did not take medication yesterday during the day, daughter reports giving pt night time meds. Daughter states pt woke up this morning confused unable to answer questions. Pt alert and oriented to self and time. Pt states "I'm here because I need oxygen." Pt is not on oxygen at home. Pt is a current smoker.

## 2017-01-24 NOTE — ED Notes (Signed)
Pt to CT

## 2017-01-25 LAB — URINE CULTURE

## 2017-01-25 LAB — SODIUM
SODIUM: 125 mmol/L — AB (ref 135–145)
Sodium: 124 mmol/L — ABNORMAL LOW (ref 135–145)
Sodium: 126 mmol/L — ABNORMAL LOW (ref 135–145)

## 2017-01-25 LAB — MAGNESIUM: Magnesium: 1.4 mg/dL — ABNORMAL LOW (ref 1.7–2.4)

## 2017-01-25 LAB — CBC
HEMATOCRIT: 32 % — AB (ref 35.0–47.0)
HEMOGLOBIN: 11.5 g/dL — AB (ref 12.0–16.0)
MCH: 34.1 pg — AB (ref 26.0–34.0)
MCHC: 36 g/dL (ref 32.0–36.0)
MCV: 94.5 fL (ref 80.0–100.0)
Platelets: 203 10*3/uL (ref 150–440)
RBC: 3.39 MIL/uL — AB (ref 3.80–5.20)
RDW: 14.2 % (ref 11.5–14.5)
WBC: 6.1 10*3/uL (ref 3.6–11.0)

## 2017-01-25 LAB — BASIC METABOLIC PANEL
ANION GAP: 6 (ref 5–15)
BUN: 12 mg/dL (ref 6–20)
CO2: 25 mmol/L (ref 22–32)
Calcium: 8 mg/dL — ABNORMAL LOW (ref 8.9–10.3)
Chloride: 89 mmol/L — ABNORMAL LOW (ref 101–111)
Creatinine, Ser: 0.6 mg/dL (ref 0.44–1.00)
GFR calc Af Amer: >60 mL/min (ref >60)
GFR calc non Af Amer: >60 mL/min (ref >60)
GLUCOSE: 100 mg/dL — AB (ref 65–99)
POTASSIUM: 3.2 mmol/L — AB (ref 3.5–5.1)
Sodium: 120 mmol/L — ABNORMAL LOW (ref 135–145)

## 2017-01-25 MED ORDER — NICOTINE 21 MG/24HR TD PT24
21.0000 mg | MEDICATED_PATCH | Freq: Every day | TRANSDERMAL | Status: DC
  Administered 2017-01-25 – 2017-01-26 (×2): 21 mg via TRANSDERMAL
  Filled 2017-01-25 (×2): qty 1

## 2017-01-25 MED ORDER — NICOTINE 21 MG/24HR TD PT24: 21.0000 mg | MEDICATED_PATCH | Freq: Every day | TRANSDERMAL | 0 refills | Status: DC

## 2017-01-25 MED ORDER — LEVOFLOXACIN 500 MG PO TABS: 500.0000 mg | ORAL_TABLET | Freq: Every day | ORAL | Status: DC

## 2017-01-25 MED ORDER — LEVOFLOXACIN 500 MG PO TABS
250.0000 mg | ORAL_TABLET | Freq: Every day | ORAL | Status: DC
  Administered 2017-01-25 – 2017-01-26 (×2): 250 mg via ORAL
  Filled 2017-01-25 (×2): qty 1

## 2017-01-25 MED ORDER — MAGNESIUM SULFATE 4 GM/100ML IV SOLN
4.0000 g | Freq: Once | INTRAVENOUS | Status: AC
  Administered 2017-01-25: 4 g via INTRAVENOUS
  Filled 2017-01-25: qty 100

## 2017-01-25 MED ORDER — LEVOFLOXACIN 500 MG PO TABS: 500.0000 mg | ORAL_TABLET | Freq: Every day | ORAL | 0 refills | Status: DC

## 2017-01-25 MED ORDER — SODIUM CHLORIDE 0.9 % IV BOLUS (SEPSIS)
500.0000 mL | Freq: Once | INTRAVENOUS | Status: AC
  Administered 2017-01-25: 500 mL via INTRAVENOUS

## 2017-01-25 NOTE — Plan of Care (Signed)
Problem: Safety: Goal: Ability to remain free from injury will improve Outcome: Progressing Remained free from injury or fall. Fall precautions in place. Kept safe and comfortable.    

## 2017-01-25 NOTE — Progress Notes (Signed)
Physical Therapy Treatment Patient Details Name: [name] MRN: [id number] DOB: 1933/07/23 Today's Date: 01/25/2017    History of Present Illness Pt is an 83 y.o. female presenting to hospital with AMS, feeling weak, restless, and dyspneic.  Pt admitted with hyponatremia, pyuria, hypokalemia, confusion, and hypoxia.  PMH includes bradycardia, h/o clavicular fx, COPD, chronic cough, HOH, htn, and back surgery x2.    PT Comments    Pt able to progress to ambulating with youth sized RW around nursing loop with CGA initially and progressed to SBA.  Pt's O2 sats 94% or greater on room air during session.  Discussed with pt and pt's daughter PT recommendations for HHPT, youth sized RW, and SBA for all functional mobility for safety and pt's daughter reports they are able to provide this for pt.  Will continue to progress pt with strengthening and progressive ambulation with least restrictive assistive device as able.   Follow Up Recommendations  Home health PT;Supervision for mobility/OOB     Equipment Recommendations   (Youth sized RW)    Recommendations for Other Services       Precautions / Restrictions Precautions Precautions: Fall Restrictions Weight Bearing Restrictions: No    Mobility  Bed Mobility               General bed mobility comments: Deferred d/t pt sitting up in chair upon PT arrival.  Transfers Overall transfer level: Needs assistance Equipment used: Rolling walker (2 wheeled) Transfers: Sit to/from Stand Sit to Stand: Min guard         General transfer comment: x1 trial with RW; x1 trial no AD; no loss of balance noted  Ambulation/Gait Ambulation/Gait assistance: Min guard;Supervision Ambulation Distance (Feet): 200 Feet Assistive device: Rolling walker (2 wheeled) Gait Pattern/deviations: Step-through pattern Gait velocity: mildly decreased   General Gait Details: 1 mild unsteadiness noted turning corner around bed in room but pt able to  self correct with assist of walker; no further unsteadiness noted and pt progressed from CGA to SBA with ambulation around nursing loop and upon return to pt's room   Stairs Stairs:  (Pt and pt's daughter declined stairs and reported no concerns for discharge home)          Wheelchair Mobility    Modified Rankin (Stroke Patients Only)       Balance Overall balance assessment: Needs assistance Sitting-balance support: Bilateral upper extremity supported;Feet supported Sitting balance-Leahy Scale: Good     Standing balance support: Bilateral upper extremity supported (on RW) Standing balance-Leahy Scale: Good Standing balance comment: during ambulation                    Cognition Arousal/Alertness: Awake/alert Behavior During Therapy: WFL for tasks assessed/performed Overall Cognitive Status: Within Functional Limits for tasks assessed                      Exercises      General Comments General comments (skin integrity, edema, etc.): Pt's daughter present during session.  Pt agreeable to PT session.      Pertinent Vitals/Pain Pain Assessment: No/denies pain    Home Living                      Prior Function            PT Goals (current goals can now be found in the care plan section) Acute Rehab PT Goals Patient Stated Goal: to go home PT Goal Formulation: With  patient Time For Goal Achievement: 02/07/17 Potential to Achieve Goals: Good Progress towards PT goals: Progressing toward goals    Frequency    Min 2X/week      PT Plan Current plan remains appropriate    Co-evaluation             End of Session Equipment Utilized During Treatment: Gait belt Activity Tolerance: Patient tolerated treatment well Patient left: in chair;with call bell/phone within reach;with chair alarm set;with family/visitor present     Time: 1145-1159 PT Time Calculation (min) (ACUTE ONLY): 14 min  Charges:  $Gait Training: 8-22  mins                    G CodesLeitha Bleak 06-Feb-2017, 12:34 PM Leitha Bleak, [place]

## 2017-01-25 NOTE — Progress Notes (Signed)
PHARMACY NOTE:  ANTIMICROBIAL RENAL DOSAGE ADJUSTMENT  Current antimicrobial regimen includes a mismatch between antimicrobial dosage and estimated renal function.  As per policy approved by the Pharmacy & Therapeutics and Medical Executive Committees, the antimicrobial dosage will be adjusted accordingly.  Current antimicrobial dosage:  Levofloxacin 500mg  daily  Indication: UTI  Renal Function:  Estimated Creatinine Clearance: 38.7 mL/min (by C-G formula based on SCr of 0.6 mg/dL). []      On intermittent HD, scheduled: []      On CRRT    Antimicrobial dosage has been changed to:  Levofloxacin 250mg  daily  Additional comments:   Thank you for allowing pharmacy to be a part of this patient's care.  Ramond Dial, Pharm.D, BCPS Clinical Pharmacist  01/25/2017 12:07 PM

## 2017-01-25 NOTE — H&P (Signed)
[facility] NAME: [name]    MR#:  [id number]  DATE OF BIRTH:  10/13/1933  DATE OF ADMISSION:  01/24/2017  PRIMARY CARE PHYSICIAN: [name], MD   REQUESTING/REFERRING PHYSICIAN:   CHIEF COMPLAINT:   Chief Complaint  Patient presents with  . Altered Mental Status    HISTORY OF PRESENT ILLNESS: [name]  is a 83 y.o. female with a known history of COPD, ongoing tobacco abuse, hyperlipidemia, hypertension, who presents to the hospital with complaints of not feeling well for the past 1-1/2 days, feeling weak, restless, dyspneic. She woke her daughter earlier today because she felt that she needed oxygen. She was also more confused over the past one to 2 days. On arrival to emergency room, her O2 sats were found to be low at 88, her labs revealed a sodium level of 114. Hospitalist services were contacted for admission. Urinalysis revealed some pyuria, patient denies any dysuria symptoms Patient feels good today, confusion has cleared. She wants to go home. Sodium level has been improving steadily, she was hypotensive earlier today, requiring IV fluid bolus. Off blood pressure medications.  PAST MEDICAL HISTORY:   Past Medical History:  Diagnosis Date  . Cardiac dysrhythmia, unspecified    Bradycardia  . Clavicular fracture    History of   . COPD (chronic obstructive pulmonary disease) ([place])   . Cough    CHRONIC  . Gastroesophageal reflux disease   . Generalized and unspecified atherosclerosis   . HOH (hard of hearing)    AIDS  . Hyperlipidemia   . Hypertension   . Orthopnea   . Osteoporosis   . Tobacco abuse     PAST SURGICAL HISTORY:  Past Surgical History:  Procedure Laterality Date  . ABDOMINAL HYSTERECTOMY    . APPENDECTOMY  20+ years ago  . BACK SURGERY  2 years ago   x2  . Cardiac Event Monitor  February 2015   Sinus rhythm sinus bradycardia rates of 46-80 beats a minute. No other  arrhythmias; occasional PACs with some being blocked., Occasional first-degree AV block.  Marland Kitchen CATARACT EXTRACTION W/PHACO Left 01/19/2017   Procedure: CATARACT EXTRACTION PHACO AND INTRAOCULAR LENS PLACEMENT (IOC);  Surgeon: Eulogio Bear, MD;  Location: [facility] ORS;  Service: Ophthalmology;  Laterality: Left;  us00:58.3 ap%14.8 cde8.63 lot # [id number]  . TRANSTHORACIC ECHOCARDIOGRAM  01/21/2014   Normal LV size and function. No regional wall motion abnormalities. Mild aortic sclerosis with trivial regurgitation.  . VESICOVAGINAL FISTULA CLOSURE W/ TAH  20+ years ago    SOCIAL HISTORY:  Social History  Substance Use Topics  . Smoking status: Current Every Day Smoker    Packs/day: 1.50    Years: 50.00  . Smokeless tobacco: Never Used  . Alcohol use No    FAMILY HISTORY:  Family History  Problem Relation Age of Onset  . Heart attack Sister   . Cancer Brother     DRUG ALLERGIES:  Allergies  Allergen Reactions  . Penicillins Other (See Comments)    Patient unsure of what reaction was. Has patient had a PCN reaction causing immediate rash, facial/tongue/throat swelling, SOB or lightheadedness with hypotension: unknown Has patient had a PCN reaction causing severe rash involving mucus membranes or skin necrosis: uknown Has patient had a PCN reaction that required hospitalization: unknown Has patient had a PCN reaction occurring within the last 10 years: No If all of the above answers are "NO", then may proceed with Cephalosporin  use.   . Sulfamethizole Other (See Comments)    Unknown reaction. Last used long time ago. Quit taking,.    Review of Systems  Constitutional: Positive for malaise/fatigue. Negative for chills, fever and weight loss.  HENT: Negative for congestion.   Eyes: Negative for blurred vision and double vision.  Respiratory: Positive for cough and shortness of breath. Negative for sputum production and wheezing.   Cardiovascular: Negative for chest pain,  palpitations, orthopnea, leg swelling and PND.  Gastrointestinal: Negative for abdominal pain, blood in stool, constipation, diarrhea, nausea and vomiting.  Genitourinary: Positive for urgency. Negative for dysuria, frequency and hematuria.  Musculoskeletal: Negative for falls.  Neurological: Negative for dizziness, tremors, focal weakness and headaches.  Endo/Heme/Allergies: Does not bruise/bleed easily.  Psychiatric/Behavioral: Negative for depression. The patient does not have insomnia.     MEDICATIONS AT HOME:  Prior to Admission medications   Medication Sig Start Date End Date Taking? Authorizing Provider  aspirin EC 81 MG tablet Take 81 mg by mouth daily.   Yes Historical Provider, MD  Aspirin-Salicylamide-Caffeine (BC HEADACHE POWDER PO) Take 1 packet by mouth 2 (two) times daily.   Yes Historical Provider, MD  carvedilol (COREG) 6.25 MG tablet TAKE ONE TABLET BY MOUTH TWICE DAILY 08/17/15  Yes Leonie Man, MD  cholecalciferol (VITAMIN D) 1000 units tablet Take 1,000 Units by mouth daily.   Yes Historical Provider, MD  hydrochlorothiazide (HYDRODIURIL) 12.5 MG tablet Take 12.5 mg by mouth daily.   Yes Historical Provider, MD  losartan (COZAAR) 100 MG tablet Take 100 mg by mouth daily. 12/27/16  Yes Historical Provider, MD  Omega-3 Fatty Acids (FISH OIL) 1000 MG CAPS Take 1 capsule by mouth daily.   Yes Historical Provider, MD  omeprazole (PRILOSEC) 20 MG capsule Take 20 mg by mouth daily.   Yes Historical Provider, MD  oxybutynin (DITROPAN) 5 MG tablet Take 5 mg by mouth 2 (two) times daily. 12/26/16  Yes Historical Provider, MD  pravastatin (PRAVACHOL) 40 MG tablet Take 40 mg by mouth daily.   Yes Historical Provider, MD      PHYSICAL EXAMINATION:   VITAL SIGNS: Blood pressure 128/62, pulse (!) 59, temperature 98.2 F (36.8 C), temperature source Oral, resp. rate (!) 28, height 5' (1.524 m), weight 45.2 kg (99 lb 10.4 oz), SpO2 99 %.  GENERAL:  83 y.o.-year-old patient lying in  the bed with no acute distress.  EYES: Pupils equal, round, reactive to light and accommodation. No scleral icterus. Extraocular muscles intact.  HEENT: Head atraumatic, normocephalic. Oropharynx and nasopharynx clear.  NECK:  Supple, no jugular venous distention. No thyroid enlargement, no tenderness.  LUNGS: Normal breath sounds bilaterally, no wheezing, rales,rhonchi or crepitation. No use of accessory muscles of respiration.  CARDIOVASCULAR: S1, S2 normal. No murmurs, rubs, or gallops.  ABDOMEN: Soft, nontender, nondistended. Bowel sounds present. No organomegaly or mass.  EXTREMITIES: Trace lower extremity and pedal edema, no cyanosis, or clubbing.  NEUROLOGIC: Cranial nerves II through XII are intact. Muscle strength 5/5 in all extremities. Sensation intact. Gait not checked.  PSYCHIATRIC: The patient is alert and oriented x 3.  SKIN: No obvious rash, lesion, or ulcer.   LABORATORY PANEL:   CBC  Recent Labs Lab 01/24/17 0427 01/25/17 0458  WBC 8.9 6.1  HGB 14.1 11.5*  HCT 38.3 32.0*  PLT 267 203  MCV 93.9 94.5  MCH 34.6* 34.1*  MCHC 36.8* 36.0  RDW 13.9 14.2   ------------------------------------------------------------------------------------------------------------------  Chemistries   Recent Labs Lab 01/24/17 0427  01/25/17 0458  01/25/17 1542  NA 114*  < > 120*  < > 124*  K 3.2*  --  3.2*  --   --   CL 78*  --  89*  --   --   CO2 24  --  25  --   --   GLUCOSE 126*  --  100*  --   --   BUN 12  --  12  --   --   CREATININE 0.72  --  0.60  --   --   CALCIUM 9.1  --  8.0*  --   --   MG  --   --  1.4*  --   --   AST 31  --   --   --   --   ALT 12*  --   --   --   --   ALKPHOS 44  --   --   --   --   BILITOT 1.0  --   --   --   --   < > = values in this interval not displayed. ------------------------------------------------------------------------------------------------------------------  Cardiac Enzymes  Recent Labs Lab 01/24/17 0427  TROPONINI <0.03    ------------------------------------------------------------------------------------------------------------------  RADIOLOGY: Dg Chest 2 View  Result Date: 01/24/2017 CLINICAL DATA:  Patient woke up with confusion. Productive cough and shortness of breath. EXAM: CHEST  2 VIEW COMPARISON:  02/15/2007 FINDINGS: Normal heart size and pulmonary vascularity. Emphysematous changes with scattered fibrosis in the lungs. Calcified hilar lymph nodes. Interstitial changes in the bases likely representing fibrosis. No focal airspace disease or consolidation. No blunting of costophrenic angles. No pneumothorax. Mediastinal contours appear intact. Calcification of the aorta. Calcifications in the left upper quadrant consistent with splenic granulomas. Mild degenerative changes in the spine. IMPRESSION: Emphysematous changes in the lungs. No evidence of active pulmonary disease. Electronically Signed   By: Lucienne Capers M.D.   On: 01/24/2017 04:55   Ct Head Wo Contrast  Result Date: 01/24/2017 CLINICAL DATA:  83 y/o  F; confusion. EXAM: CT HEAD WITHOUT CONTRAST TECHNIQUE: Contiguous axial images were obtained from the base of the skull through the vertex without intravenous contrast. COMPARISON:  None. FINDINGS: Brain: No evidence of acute infarction, hemorrhage, hydrocephalus, extra-axial collection or mass lesion/mass effect. Few scattered foci of hypoattenuation in subcortical white matter and right hemi pons probably represent mild chronic microvascular ischemic changes. Mild brain parenchymal volume loss. Vascular: Mild calcific atherosclerosis of cavernous internal carotid arteries. Skull: Normal. Negative for fracture or focal lesion. Sinuses/Orbits: No acute finding. Left intra-ocular lens replacement. Other: None. IMPRESSION: 1. No acute intracranial abnormality identified. 2. Mild chronic microvascular ischemic changes and parenchymal volume loss of the brain. Electronically Signed   By: Kristine Garbe M.D.   On: 01/24/2017 06:17   Ct Angio Chest Pe W Or Wo Contrast  Result Date: 01/24/2017 CLINICAL DATA:  Not feeling well, arm pain, hypoxia, acute respiratory failure, history hypertension, smoker, COPD EXAM: CT ANGIOGRAPHY CHEST WITH CONTRAST TECHNIQUE: Multidetector CT imaging of the chest was performed using the standard protocol during bolus administration of intravenous contrast. Multiplanar CT image reconstructions and MIPs were obtained to evaluate the vascular anatomy. CONTRAST:  75 cc Isovue 370 IV COMPARISON:  None FINDINGS: Cardiovascular: Atherosclerotic calcifications aorta, coronary arteries, and proximal great vessels. Aorta normal caliber without aneurysm or dissection. Pulmonary arteries well opacified and patent. No evidence of pulmonary embolism. No pericardial effusion. Mediastinum/Nodes: Calcified mediastinal and RIGHT hilar lymph nodes. No thoracic adenopathy. Questionable mild  wall thickening of the distal esophagus. Visualized base of cervical region normal appearance. Lungs/Pleura: Severe emphysematous changes greatest at apices. Calcified granulomata RIGHT upper lobe. No acute infiltrate, pleural effusion or pneumothorax. Upper Abdomen: Calcified granulomata within spleen. Remaining visualized upper abdomen grossly unremarkable. Musculoskeletal: Diffuse osseous demineralization. No acute bony findings. Review of the MIP images confirms the above findings. IMPRESSION: No evidence of pulmonary embolism. Aortic atherosclerosis with additional coronary arterial calcifications. Changes of severe COPD and old granulomatous disease. Electronically Signed   By: Lavonia Dana M.D.   On: 01/24/2017 09:16    EKG: Orders placed or performed during the hospital encounter of 01/24/17  . EKG 12-Lead  . EKG 12-Lead  . ED EKG  . ED EKG  . EKG 12-Lead  . EKG 12-Lead  EKG in the emergency room revealed sinus rhythm at 59 bpm by atrial enlargement, nonspecific intraventricular  conduction delay, no acute ST-T changes  IMPRESSION AND PLAN:  Principal Problem:   Hyponatremia Active Problems:   Hypokalemia   UTI (urinary tract infection)   Confusion   Hypoxia   Hypomagnesemia  #1. Hyponatremia, likely HCTZ  related, questionable poor oral intake, of HCTZ, continue normal saline infusion, follow sodium level every 4 hours, urine osmolarity was low, TSH and cortisol level were normal, unlikely SIADH #2. Pyuria, questionable urinary tract infection, awaiting for urine cultures, need to be repeated, due to contamination, initiate antibiotic therapy as patient is relatively hypotensive, follow clinically #3. Hypokalemia, supplement intravenously, magnesium level was found to be low, supplemented intravenously, follow in the morning #4. Confusion, likely hyponatremia related, CT of head showed no acute changes, supportive therapy, confusion, has improved #5. Hypoxia, chest x-ray was unremarkable, CT angiogram showed no pulmonary embolism, but severe emphysema, patient's chest x-ray revealed emphysema as well, , patient may benefit from oxygen therapy at home, get O2 sats on room air prior to discharge from the hospital #6. Hypomagnesemia, supplementing intravenously, follow potassium as well as magnesium levels in the morning All the records are reviewed and case discussed with ED provider. Management plans discussed with the patient, family and they are in agreement.  CODE STATUS: Code Status History    This patient does not have a recorded code status. Please follow your organizational policy for patients in this situation.    Advance Directive Documentation   Flowsheet Row Most Recent Value  Type of Advance Directive  Healthcare Power of Attorney  Pre-existing out of facility DNR order (yellow form or pink MOST form)  No data  "MOST" Form in Place?  No data       TOTAL TIME TAKING CARE OF THIS PATIENT: 35 minutes.  Discussed with patient's daughter, all  questions were answered  Jaran Sainz M.D on 01/25/2017 at 5:59 PM  Between 7am to 6pm - Pager - [phone] After 6pm go to [email or web address] - password [email or web address] [facility]  Office  [phone]  CC: Primary care physician; [name], MD

## 2017-01-25 NOTE — Care Management (Signed)
patient lives with her daughter.  She is agreeable to home health nurse and physical therapy.  No agency preference. Referral called to [facility].  Agency accepts patient insurance.  Requested order for rolling walker also to be delivered to patient's room prior to discharge.  If sodium level is improved will discharge home later today

## 2017-01-25 NOTE — Progress Notes (Signed)
SATURATION QUALIFICATIONS: (This note is used to comply with regulatory documentation for home oxygen)  Patient Saturations on Room Air at Rest = 95%  Patient Saturations on Room Air while Ambulating = 95%  Patient Saturations on 0 Liters of oxygen while Ambulating = 95%  Please briefly explain why patient needs home oxygen: 

## 2017-01-26 LAB — URINE CULTURE

## 2017-01-26 LAB — BASIC METABOLIC PANEL
Anion gap: 4 — ABNORMAL LOW (ref 5–15)
BUN: 11 mg/dL (ref 6–20)
CHLORIDE: 101 mmol/L (ref 101–111)
CO2: 24 mmol/L (ref 22–32)
CREATININE: 0.64 mg/dL (ref 0.44–1.00)
Calcium: 7.8 mg/dL — ABNORMAL LOW (ref 8.9–10.3)
GFR calc non Af Amer: >60 mL/min (ref >60)
Glucose, Bld: 93 mg/dL (ref 65–99)
Potassium: 3.6 mmol/L (ref 3.5–5.1)
Sodium: 129 mmol/L — ABNORMAL LOW (ref 135–145)

## 2017-01-26 LAB — MAGNESIUM: MAGNESIUM: 2 mg/dL (ref 1.7–2.4)

## 2017-01-26 LAB — SODIUM: SODIUM: 131 mmol/L — AB (ref 135–145)

## 2017-01-26 NOTE — Discharge Summary (Signed)
[name] NAME: [name]    MR#:  [id number]  DATE OF BIRTH:  10-19-1933  DATE OF ADMISSION:  01/24/2017 ADMITTING PHYSICIAN: Theodoro Grist, MD  DATE OF DISCHARGE: 01/26/2017  1:10 PM  PRIMARY CARE PHYSICIAN: [name], MD     ADMISSION DIAGNOSIS:  Confusion [R41.0] Hyponatremia [E87.1] Acute respiratory failure with hypoxia ([place]) [J96.01]  DISCHARGE DIAGNOSIS:  Principal Problem:   Hyponatremia Active Problems:   Hypokalemia   UTI (urinary tract infection)   Confusion   Hypoxia   Hypomagnesemia   SECONDARY DIAGNOSIS:   Past Medical History:  Diagnosis Date  . Cardiac dysrhythmia, unspecified    Bradycardia  . Clavicular fracture    History of   . COPD (chronic obstructive pulmonary disease) ([place])   . Cough    CHRONIC  . Gastroesophageal reflux disease   . Generalized and unspecified atherosclerosis   . HOH (hard of hearing)    AIDS  . Hyperlipidemia   . Hypertension   . Orthopnea   . Osteoporosis   . Tobacco abuse     .pro HOSPITAL COURSE:  Yahdira Nishiyama  is a 83 y.o. female with a known history of COPD, ongoing tobacco abuse, hyperlipidemia, hypertension, who presents to the hospital with complaints of not feeling well for the past 1-1/2 days, feeling weak, restless, dyspneic. She woke her daughter earlier today because she felt that she needed oxygen. She was also more confused over the past one to 2 days. On arrival to emergency room, her O2 sats were found to be low at 88, her labs revealed a sodium level of 114. Hospitalist services were contacted for admission. Urinalysis revealed some pyuria, patient denies any dysuria symptoms. Patient was relatively hypotensive. It was felt that patient hyponatremia could have been related to dehydration, HCTZ. Patient was initiated on IV fluids and her sodium level steadily increased to 131. By the day of discharge. Patient's blood pressure also  improved, however, remained at around 120. Blood pressure medications were not reinitiated until patient sees her primary care physician in about one week after discharge.  Discussion by problem #1. Hyponatremia, likely HCTZ  related, questionable bacterial infection, of HCTZ now, sodium level has improved to 131 with  normal saline infusion, urine osmolarity was low, TSH and cortisol level were normal, unlikely SIADH #2. Pyuria, questionable urinary tract infection, urine cultures came back as multiple species, recollection was suggested 2, second time culture was taken as catheterized urine,  patient was asymptomatic clinically, however, due to relative hypotension, , abnormal urine cultures, levofloxacin was initiated, patient is to continue antibiotic for 5 days to complete course. It is recommended to follow patient's urinary cultures as outpatient. #3. Hypokalemia, supplemented intravenously, magnesium level was found to be low, supplemented intravenously as well, normalized #4. Confusion, likely hyponatremia related, CT of head showed no acute changes, supportive therapy provided, confusion has resolved  #5. Hypoxia, chest x-ray was unremarkable, CT angiogram showed no pulmonary embolism, but severe emphysema, patient's chest x-ray revealed emphysema as well, , patient did not qualify for oxygen therapy at home #6. Hypomagnesemia, supplemented intravenously , normalized  DISCHARGE CONDITIONS:   Stable  CONSULTS OBTAINED:    DRUG ALLERGIES:   Allergies  Allergen Reactions  . Penicillins Other (See Comments)    Patient unsure of what reaction was. Has patient had a PCN reaction causing immediate rash, facial/tongue/throat swelling, SOB or lightheadedness with hypotension: unknown Has patient had a PCN reaction causing  severe rash involving mucus membranes or skin necrosis: uknown Has patient had a PCN reaction that required hospitalization: unknown Has patient had a PCN reaction  occurring within the last 10 years: No If all of the above answers are "NO", then may proceed with Cephalosporin use.   Francine Graven Other (See Comments)    Unknown reaction. Last used long time ago. Quit taking,.    DISCHARGE MEDICATIONS:   Discharge Medication List as of 01/26/2017  1:01 PM    START taking these medications   Details  levofloxacin (LEVAQUIN) 500 MG tablet Take 1 tablet (500 mg total) by mouth daily., Starting Wed 01/25/2017, Normal    nicotine (NICODERM CQ - DOSED IN MG/24 HOURS) 21 mg/24hr patch Place 1 patch (21 mg total) onto the skin daily., Starting Thu 01/26/2017, Normal      CONTINUE these medications which have NOT CHANGED   Details  aspirin EC 81 MG tablet Take 81 mg by mouth daily., Historical Med    Aspirin-Salicylamide-Caffeine (BC HEADACHE POWDER PO) Take 1 packet by mouth 2 (two) times daily., Historical Med    cholecalciferol (VITAMIN D) 1000 units tablet Take 1,000 Units by mouth daily., Historical Med    Omega-3 Fatty Acids (FISH OIL) 1000 MG CAPS Take 1 capsule by mouth daily., Historical Med    omeprazole (PRILOSEC) 20 MG capsule Take 20 mg by mouth daily., Historical Med    oxybutynin (DITROPAN) 5 MG tablet Take 5 mg by mouth 2 (two) times daily., Starting Mon 12/26/2016, Historical Med    pravastatin (PRAVACHOL) 40 MG tablet Take 40 mg by mouth daily., Historical Med      STOP taking these medications     carvedilol (COREG) 6.25 MG tablet      hydrochlorothiazide (HYDRODIURIL) 12.5 MG tablet      losartan (COZAAR) 100 MG tablet          DISCHARGE INSTRUCTIONS:    The patient is to follow-up with primary care physician as outpatient  If you experience worsening of your admission symptoms, develop shortness of breath, life threatening emergency, suicidal or homicidal thoughts you must seek medical attention immediately by calling 911 or calling your MD immediately  if symptoms less severe.  You Must read complete  instructions/literature along with all the possible adverse reactions/side effects for all the Medicines you take and that have been prescribed to you. Take any new Medicines after you have completely understood and accept all the possible adverse reactions/side effects.   Please note  You were cared for by a hospitalist during your hospital stay. If you have any questions about your discharge medications or the care you received while you were in the hospital after you are discharged, you can call the unit and asked to speak with the hospitalist on call if the hospitalist that took care of you is not available. Once you are discharged, your primary care physician will handle any further medical issues. Please note that NO REFILLS for any discharge medications will be authorized once you are discharged, as it is imperative that you return to your primary care physician (or establish a relationship with a primary care physician if you do not have one) for your aftercare needs so that they can reassess your need for medications and monitor your lab values.    Today   CHIEF COMPLAINT:   Chief Complaint  Patient presents with  . Altered Mental Status    HISTORY OF PRESENT ILLNESS:  [name]  is a 83 y.o.  female with a known history of COPD, ongoing tobacco abuse, hyperlipidemia, hypertension, who presents to the hospital with complaints of not feeling well for the past 1-1/2 days, feeling weak, restless, dyspneic. She woke her daughter earlier today because she felt that she needed oxygen. She was also more confused over the past one to 2 days. On arrival to emergency room, her O2 sats were found to be low at 88, her labs revealed a sodium level of 114. Hospitalist services were contacted for admission. Urinalysis revealed some pyuria, patient denies any dysuria symptoms. Patient was relatively hypotensive. It was felt that patient hyponatremia could have been related to dehydration, HCTZ. Patient  was initiated on IV fluids and her sodium level steadily increased to 131. By the day of discharge. Patient's blood pressure also improved, however, remained at around 120. Blood pressure medications were not reinitiated until patient sees her primary care physician in about one week after discharge.  Discussion by problem #1. Hyponatremia, likely HCTZ  related, questionable bacterial infection, of HCTZ now, sodium level has improved to 131 with  normal saline infusion, urine osmolarity was low, TSH and cortisol level were normal, unlikely SIADH #2. Pyuria, questionable urinary tract infection, urine cultures came back as multiple species, recollection was suggested 2, second time culture was taken as catheterized urine,  patient was asymptomatic clinically, however, due to relative hypotension, , abnormal urine cultures, levofloxacin was initiated, patient is to continue antibiotic for 5 days to complete course. It is recommended to follow patient's urinary cultures as outpatient. #3. Hypokalemia, supplemented intravenously, magnesium level was found to be low, supplemented intravenously as well, normalized #4. Confusion, likely hyponatremia related, CT of head showed no acute changes, supportive therapy provided, confusion has resolved  #5. Hypoxia, chest x-ray was unremarkable, CT angiogram showed no pulmonary embolism, but severe emphysema, patient's chest x-ray revealed emphysema as well, , patient did not qualify for oxygen therapy at home #6. Hypomagnesemia, supplemented intravenously , normalized     VITAL SIGNS:  Blood pressure (!) 118/53, pulse 71, temperature 98.2 F (36.8 C), temperature source Oral, resp. rate 20, height 5' (1.524 m), weight 44.5 kg (98 lb), SpO2 96 %.  I/O:   Intake/Output Summary (Last 24 hours) at 01/26/17 1400 Last data filed at 01/26/17 0349  Gross per 24 hour  Intake             1946 ml  Output             1600 ml  Net              346 ml    PHYSICAL  EXAMINATION:  GENERAL:  83 y.o.-year-old patient lying in the bed with no acute distress.  EYES: Pupils equal, round, reactive to light and accommodation. No scleral icterus. Extraocular muscles intact.  HEENT: Head atraumatic, normocephalic. Oropharynx and nasopharynx clear.  NECK:  Supple, no jugular venous distention. No thyroid enlargement, no tenderness.  LUNGS: Normal breath sounds bilaterally, no wheezing, rales,rhonchi or crepitation. No use of accessory muscles of respiration.  CARDIOVASCULAR: S1, S2 normal. No murmurs, rubs, or gallops.  ABDOMEN: Soft, non-tender, non-distended. Bowel sounds present. No organomegaly or mass.  EXTREMITIES: No pedal edema, cyanosis, or clubbing.  NEUROLOGIC: Cranial nerves II through XII are intact. Muscle strength 5/5 in all extremities. Sensation intact. Gait not checked.  PSYCHIATRIC: The patient is alert and oriented x 3.  SKIN: No obvious rash, lesion, or ulcer.   DATA REVIEW:   CBC  Recent Labs Lab 01/25/17  0458  WBC 6.1  HGB 11.5*  HCT 32.0*  PLT 203    Chemistries   Recent Labs Lab 01/24/17 0427  01/26/17 0531 01/26/17 1047  NA 114*  < > 129* 131*  K 3.2*  < > 3.6  --   CL 78*  < > 101  --   CO2 24  < > 24  --   GLUCOSE 126*  < > 93  --   BUN 12  < > 11  --   CREATININE 0.72  < > 0.64  --   CALCIUM 9.1  < > 7.8*  --   MG  --   < > 2.0  --   AST 31  --   --   --   ALT 12*  --   --   --   ALKPHOS 44  --   --   --   BILITOT 1.0  --   --   --   < > = values in this interval not displayed.  Cardiac Enzymes  Recent Labs Lab 01/24/17 0427  TROPONINI <0.03    Microbiology Results  Results for orders placed or performed during the hospital encounter of 01/24/17  Urine culture     Status: Abnormal   Collection Time: 01/24/17  9:21 AM  Result Value Ref Range Status   Specimen Description URINE, RANDOM  Final   Special Requests NONE  Final   Culture MULTIPLE SPECIES PRESENT, SUGGEST RECOLLECTION (A)  Final   Report  Status 01/25/2017 FINAL  Final  Urine culture     Status: Abnormal   Collection Time: 01/25/17  1:24 PM  Result Value Ref Range Status   Specimen Description URINE, RANDOM  Final   Special Requests NONE  Final   Culture MULTIPLE SPECIES PRESENT, SUGGEST RECOLLECTION (A)  Final   Report Status 01/26/2017 FINAL  Final    RADIOLOGY:  No results found.  EKG:   Orders placed or performed during the hospital encounter of 01/24/17  . EKG 12-Lead  . EKG 12-Lead  . ED EKG  . ED EKG  . EKG 12-Lead  . EKG 12-Lead      Management plans discussed with the patient, family and they are in agreement.  CODE STATUS:     Code Status Orders        Start     Ordered   01/24/17 0858  Full code  Continuous     01/24/17 0857    Code Status History    Date Active Date Inactive Code Status Order ID Comments User Context   This patient has a current code status but no historical code status.    Advance Directive Documentation   Flowsheet Row Most Recent Value  Type of Advance Directive  Healthcare Power of Attorney  Pre-existing out of facility DNR order (yellow form or pink MOST form)  No data  "MOST" Form in Place?  No data      TOTAL TIME TAKING CARE OF THIS PATIENT: 40 minutes.    Theodoro Grist M.D on 01/26/2017 at 2:00 PM  Between 7am to 6pm - Pager - [phone]  After 6pm go to [email or web address] - password [email or web address] [facility]  Office  [phone]  CC: Primary care physician; [name], MD

## 2017-01-26 NOTE — Progress Notes (Signed)
Physical Therapy Treatment Patient Details Name: [name] MRN: [id number] DOB: November 27, 1933 Today's Date: 01/26/2017    History of Present Illness Pt is an 83 y.o. female presenting to hospital with AMS, feeling weak, restless, and dyspneic.  Pt admitted with hyponatremia, pyuria, hypokalemia, confusion, and hypoxia.  PMH includes bradycardia, h/o clavicular fx, COPD, chronic cough, HOH, htn, and back surgery x2.    PT Comments    Bed mobility with rail and HOB raised but no assist.  Sitting unsupported without LOB.  She was able to stand and ambulate around nursing unit with min guard/supervision and walker.  She did require verbal cues to increase general safety with transfers.  O2 sats on room air at rest 99%.  After gait 98% on room air.  Pt reported no SOB but some mild fatigue.   Follow Up Recommendations  Home health PT;Supervision for mobility/OOB     Equipment Recommendations       Recommendations for Other Services       Precautions / Restrictions Precautions Precautions: Fall Restrictions Weight Bearing Restrictions: No    Mobility  Bed Mobility Overal bed mobility: Needs Assistance Bed Mobility: Sit to Supine       Sit to supine: Supervision;HOB elevated      Transfers Overall transfer level: Needs assistance Equipment used: Rolling walker (2 wheeled) Transfers: Sit to/from Stand Sit to Stand: Min guard         General transfer comment: verval cues for hand placements when sitting  Ambulation/Gait Ambulation/Gait assistance: Min guard;Supervision Ambulation Distance (Feet): 200 Feet Assistive device: Rolling walker (2 wheeled) Gait Pattern/deviations: Step-through pattern Gait velocity: mildly decreased   General Gait Details: steadiness increased with ambulation distances.  no lob's noted and pt able to self correct imbalances   Stairs            Wheelchair Mobility    Modified Rankin (Stroke Patients Only)       Balance  Overall balance assessment: Needs assistance Sitting-balance support: Bilateral upper extremity supported;Feet supported Sitting balance-Leahy Scale: Good     Standing balance support: Bilateral upper extremity supported Standing balance-Leahy Scale: Good Standing balance comment: during ambulation                    Cognition Arousal/Alertness: Awake/alert Behavior During Therapy: WFL for tasks assessed/performed Overall Cognitive Status: Within Functional Limits for tasks assessed                      Exercises Other Exercises Other Exercises: declined exercises    General Comments        Pertinent Vitals/Pain Pain Assessment: No/denies pain    Home Living                      Prior Function            PT Goals (current goals can now be found in the care plan section) Progress towards PT goals: Progressing toward goals    Frequency    Min 2X/week      PT Plan Current plan remains appropriate    Co-evaluation             End of Session Equipment Utilized During Treatment: Gait belt Activity Tolerance: Patient tolerated treatment well Patient left: in chair;with call bell/phone within reach;with chair alarm set;with family/visitor present     Time: [id number] PT Time Calculation (min) (ACUTE ONLY): 13 min  Charges:  $Gait Training: 8-22 mins  G Codes:      Chesley Noon 01/26/2017, 9:40 AM

## 2017-01-26 NOTE — Care Management (Signed)
Discharge to home today per Dr. Ether Griffins. Will be followed by [facility] for services in the home. Floydene Flock, [facility] representative updated. Family will transport. Shelbie Ammons RN MSN CCM Care Management

## 2017-01-26 NOTE — Care Management Important Message (Signed)
Important Message  Patient Details  Name: [name] MRN: [id number] Date of Birth: [birth year]   Medicare Important Message Given:  Yes    Shelbie Ammons, RN 01/26/2017, 8:32 AM

## 2017-01-27 DIAGNOSIS — E785 Hyperlipidemia, unspecified: Secondary | ICD-10-CM | POA: Diagnosis not present

## 2017-01-27 DIAGNOSIS — I10 Essential (primary) hypertension: Secondary | ICD-10-CM | POA: Diagnosis not present

## 2017-01-27 DIAGNOSIS — N390 Urinary tract infection, site not specified: Secondary | ICD-10-CM | POA: Diagnosis not present

## 2017-01-27 DIAGNOSIS — R69 Illness, unspecified: Secondary | ICD-10-CM | POA: Diagnosis not present

## 2017-01-27 DIAGNOSIS — J449 Chronic obstructive pulmonary disease, unspecified: Secondary | ICD-10-CM | POA: Diagnosis not present

## 2017-01-27 DIAGNOSIS — K219 Gastro-esophageal reflux disease without esophagitis: Secondary | ICD-10-CM | POA: Diagnosis not present

## 2017-01-27 DIAGNOSIS — M6281 Muscle weakness (generalized): Secondary | ICD-10-CM | POA: Diagnosis not present

## 2017-01-27 DIAGNOSIS — Z7982 Long term (current) use of aspirin: Secondary | ICD-10-CM | POA: Diagnosis not present

## 2017-01-30 DIAGNOSIS — I10 Essential (primary) hypertension: Secondary | ICD-10-CM | POA: Diagnosis not present

## 2017-01-30 DIAGNOSIS — Z7982 Long term (current) use of aspirin: Secondary | ICD-10-CM | POA: Diagnosis not present

## 2017-01-30 DIAGNOSIS — R69 Illness, unspecified: Secondary | ICD-10-CM | POA: Diagnosis not present

## 2017-01-30 DIAGNOSIS — M6281 Muscle weakness (generalized): Secondary | ICD-10-CM | POA: Diagnosis not present

## 2017-01-30 DIAGNOSIS — K219 Gastro-esophageal reflux disease without esophagitis: Secondary | ICD-10-CM | POA: Diagnosis not present

## 2017-01-30 DIAGNOSIS — J449 Chronic obstructive pulmonary disease, unspecified: Secondary | ICD-10-CM | POA: Diagnosis not present

## 2017-01-30 DIAGNOSIS — E785 Hyperlipidemia, unspecified: Secondary | ICD-10-CM | POA: Diagnosis not present

## 2017-01-30 DIAGNOSIS — N390 Urinary tract infection, site not specified: Secondary | ICD-10-CM | POA: Diagnosis not present

## 2017-01-31 DIAGNOSIS — R5381 Other malaise: Secondary | ICD-10-CM | POA: Diagnosis not present

## 2017-01-31 DIAGNOSIS — E871 Hypo-osmolality and hyponatremia: Secondary | ICD-10-CM | POA: Diagnosis not present

## 2017-01-31 DIAGNOSIS — N390 Urinary tract infection, site not specified: Secondary | ICD-10-CM | POA: Diagnosis not present

## 2017-01-31 DIAGNOSIS — E876 Hypokalemia: Secondary | ICD-10-CM | POA: Diagnosis not present

## 2017-01-31 DIAGNOSIS — E7800 Pure hypercholesterolemia, unspecified: Secondary | ICD-10-CM | POA: Diagnosis not present

## 2017-01-31 DIAGNOSIS — E559 Vitamin D deficiency, unspecified: Secondary | ICD-10-CM | POA: Diagnosis not present

## 2017-02-01 DIAGNOSIS — J449 Chronic obstructive pulmonary disease, unspecified: Secondary | ICD-10-CM | POA: Diagnosis not present

## 2017-02-01 DIAGNOSIS — I10 Essential (primary) hypertension: Secondary | ICD-10-CM | POA: Diagnosis not present

## 2017-02-01 DIAGNOSIS — R69 Illness, unspecified: Secondary | ICD-10-CM | POA: Diagnosis not present

## 2017-02-01 DIAGNOSIS — K219 Gastro-esophageal reflux disease without esophagitis: Secondary | ICD-10-CM | POA: Diagnosis not present

## 2017-02-01 DIAGNOSIS — E785 Hyperlipidemia, unspecified: Secondary | ICD-10-CM | POA: Diagnosis not present

## 2017-02-01 DIAGNOSIS — N390 Urinary tract infection, site not specified: Secondary | ICD-10-CM | POA: Diagnosis not present

## 2017-02-01 DIAGNOSIS — M6281 Muscle weakness (generalized): Secondary | ICD-10-CM | POA: Diagnosis not present

## 2017-02-01 DIAGNOSIS — Z7982 Long term (current) use of aspirin: Secondary | ICD-10-CM | POA: Diagnosis not present

## 2017-02-03 DIAGNOSIS — J449 Chronic obstructive pulmonary disease, unspecified: Secondary | ICD-10-CM | POA: Diagnosis not present

## 2017-02-03 DIAGNOSIS — E785 Hyperlipidemia, unspecified: Secondary | ICD-10-CM | POA: Diagnosis not present

## 2017-02-03 DIAGNOSIS — I10 Essential (primary) hypertension: Secondary | ICD-10-CM | POA: Diagnosis not present

## 2017-02-03 DIAGNOSIS — R69 Illness, unspecified: Secondary | ICD-10-CM | POA: Diagnosis not present

## 2017-02-03 DIAGNOSIS — K219 Gastro-esophageal reflux disease without esophagitis: Secondary | ICD-10-CM | POA: Diagnosis not present

## 2017-02-03 DIAGNOSIS — M6281 Muscle weakness (generalized): Secondary | ICD-10-CM | POA: Diagnosis not present

## 2017-02-03 DIAGNOSIS — N390 Urinary tract infection, site not specified: Secondary | ICD-10-CM | POA: Diagnosis not present

## 2017-02-03 DIAGNOSIS — Z7982 Long term (current) use of aspirin: Secondary | ICD-10-CM | POA: Diagnosis not present

## 2017-02-06 DIAGNOSIS — H2511 Age-related nuclear cataract, right eye: Secondary | ICD-10-CM | POA: Diagnosis not present

## 2017-02-07 ENCOUNTER — Encounter: Payer: Self-pay | Admitting: *Deleted

## 2017-02-07 DIAGNOSIS — K219 Gastro-esophageal reflux disease without esophagitis: Secondary | ICD-10-CM | POA: Diagnosis not present

## 2017-02-07 DIAGNOSIS — R69 Illness, unspecified: Secondary | ICD-10-CM | POA: Diagnosis not present

## 2017-02-07 DIAGNOSIS — J449 Chronic obstructive pulmonary disease, unspecified: Secondary | ICD-10-CM | POA: Diagnosis not present

## 2017-02-07 DIAGNOSIS — M6281 Muscle weakness (generalized): Secondary | ICD-10-CM | POA: Diagnosis not present

## 2017-02-07 DIAGNOSIS — Z7982 Long term (current) use of aspirin: Secondary | ICD-10-CM | POA: Diagnosis not present

## 2017-02-07 DIAGNOSIS — E785 Hyperlipidemia, unspecified: Secondary | ICD-10-CM | POA: Diagnosis not present

## 2017-02-07 DIAGNOSIS — I10 Essential (primary) hypertension: Secondary | ICD-10-CM | POA: Diagnosis not present

## 2017-02-07 DIAGNOSIS — N390 Urinary tract infection, site not specified: Secondary | ICD-10-CM | POA: Diagnosis not present

## 2017-02-10 DIAGNOSIS — I10 Essential (primary) hypertension: Secondary | ICD-10-CM | POA: Diagnosis not present

## 2017-02-10 DIAGNOSIS — M6281 Muscle weakness (generalized): Secondary | ICD-10-CM | POA: Diagnosis not present

## 2017-02-10 DIAGNOSIS — N390 Urinary tract infection, site not specified: Secondary | ICD-10-CM | POA: Diagnosis not present

## 2017-02-10 DIAGNOSIS — R69 Illness, unspecified: Secondary | ICD-10-CM | POA: Diagnosis not present

## 2017-02-10 DIAGNOSIS — E785 Hyperlipidemia, unspecified: Secondary | ICD-10-CM | POA: Diagnosis not present

## 2017-02-10 DIAGNOSIS — K219 Gastro-esophageal reflux disease without esophagitis: Secondary | ICD-10-CM | POA: Diagnosis not present

## 2017-02-10 DIAGNOSIS — Z7982 Long term (current) use of aspirin: Secondary | ICD-10-CM | POA: Diagnosis not present

## 2017-02-10 DIAGNOSIS — J449 Chronic obstructive pulmonary disease, unspecified: Secondary | ICD-10-CM | POA: Diagnosis not present

## 2017-02-14 DIAGNOSIS — R69 Illness, unspecified: Secondary | ICD-10-CM | POA: Diagnosis not present

## 2017-02-14 DIAGNOSIS — Z7982 Long term (current) use of aspirin: Secondary | ICD-10-CM | POA: Diagnosis not present

## 2017-02-14 DIAGNOSIS — J449 Chronic obstructive pulmonary disease, unspecified: Secondary | ICD-10-CM | POA: Diagnosis not present

## 2017-02-14 DIAGNOSIS — M6281 Muscle weakness (generalized): Secondary | ICD-10-CM | POA: Diagnosis not present

## 2017-02-14 DIAGNOSIS — E785 Hyperlipidemia, unspecified: Secondary | ICD-10-CM | POA: Diagnosis not present

## 2017-02-14 DIAGNOSIS — I10 Essential (primary) hypertension: Secondary | ICD-10-CM | POA: Diagnosis not present

## 2017-02-14 DIAGNOSIS — K219 Gastro-esophageal reflux disease without esophagitis: Secondary | ICD-10-CM | POA: Diagnosis not present

## 2017-02-14 DIAGNOSIS — N390 Urinary tract infection, site not specified: Secondary | ICD-10-CM | POA: Diagnosis not present

## 2017-02-16 ENCOUNTER — Ambulatory Visit
Admission: RE | Admit: 2017-02-16 | Discharge: 2017-02-16 | Disposition: A | Discharge status: Home / Self Care | Payer: Medicare HMO | Source: Ambulatory Visit | Attending: Ophthalmology | Admitting: Ophthalmology

## 2017-02-16 ENCOUNTER — Encounter: Payer: Self-pay | Admitting: *Deleted

## 2017-02-16 ENCOUNTER — Ambulatory Visit: Payer: Medicare HMO | Admitting: Anesthesiology

## 2017-02-16 ENCOUNTER — Encounter
Admission: RE | Disposition: A | Discharge status: Home / Self Care | Payer: Self-pay | Source: Ambulatory Visit | Attending: Ophthalmology

## 2017-02-16 DIAGNOSIS — J449 Chronic obstructive pulmonary disease, unspecified: Secondary | ICD-10-CM | POA: Diagnosis not present

## 2017-02-16 DIAGNOSIS — F17200 Nicotine dependence, unspecified, uncomplicated: Secondary | ICD-10-CM | POA: Diagnosis not present

## 2017-02-16 DIAGNOSIS — H2511 Age-related nuclear cataract, right eye: Secondary | ICD-10-CM | POA: Diagnosis not present

## 2017-02-16 DIAGNOSIS — I10 Essential (primary) hypertension: Secondary | ICD-10-CM | POA: Insufficient documentation

## 2017-02-16 DIAGNOSIS — R0601 Orthopnea: Secondary | ICD-10-CM | POA: Insufficient documentation

## 2017-02-16 DIAGNOSIS — E871 Hypo-osmolality and hyponatremia: Secondary | ICD-10-CM | POA: Insufficient documentation

## 2017-02-16 DIAGNOSIS — E785 Hyperlipidemia, unspecified: Secondary | ICD-10-CM | POA: Diagnosis not present

## 2017-02-16 DIAGNOSIS — Z79899 Other long term (current) drug therapy: Secondary | ICD-10-CM | POA: Insufficient documentation

## 2017-02-16 DIAGNOSIS — M810 Age-related osteoporosis without current pathological fracture: Secondary | ICD-10-CM | POA: Diagnosis not present

## 2017-02-16 DIAGNOSIS — K219 Gastro-esophageal reflux disease without esophagitis: Secondary | ICD-10-CM | POA: Diagnosis not present

## 2017-02-16 DIAGNOSIS — R69 Illness, unspecified: Secondary | ICD-10-CM | POA: Diagnosis not present

## 2017-02-16 HISTORY — PX: CATARACT EXTRACTION W/PHACO: SHX586

## 2017-02-16 HISTORY — DX: Hypo-osmolality and hyponatremia: E87.1

## 2017-02-16 LAB — POCT I-STAT 4, (NA,K, GLUC, HGB,HCT)
Glucose, Bld: 95 mg/dL (ref 65–99)
HEMATOCRIT: 40 % (ref 36.0–46.0)
HEMOGLOBIN: 13.6 g/dL (ref 12.0–15.0)
Potassium: 3.9 mmol/L (ref 3.5–5.1)
Sodium: 136 mmol/L (ref 135–145)

## 2017-02-16 SURGERY — PHACOEMULSIFICATION, CATARACT, WITH IOL INSERTION
Anesthesia: Monitor Anesthesia Care | Site: Eye | Laterality: Right | Wound class: Clean

## 2017-02-16 MED ORDER — ALFENTANIL 500 MCG/ML IJ INJ
INJECTION | INTRAMUSCULAR | Status: DC | PRN
  Administered 2017-02-16: 250 ug via INTRAVENOUS

## 2017-02-16 MED ORDER — SODIUM CHLORIDE 0.9 % IV SOLN
INTRAVENOUS | Status: DC
  Administered 2017-02-16 (×2): via INTRAVENOUS

## 2017-02-16 MED ORDER — SODIUM HYALURONATE 23 MG/ML IO SOLN
INTRAOCULAR | Status: AC
  Filled 2017-02-16: qty 0.6

## 2017-02-16 MED ORDER — EPINEPHRINE PF 1 MG/ML IJ SOLN
INTRAMUSCULAR | Status: DC | PRN
  Administered 2017-02-16: 1 mL via OPHTHALMIC

## 2017-02-16 MED ORDER — MIDAZOLAM HCL 2 MG/2ML IJ SOLN
INTRAMUSCULAR | Status: DC | PRN
Start: 2017-02-16 — End: 2017-02-16
  Administered 2017-02-16: 0.5 mg via INTRAVENOUS

## 2017-02-16 MED ORDER — EPINEPHRINE PF 1 MG/ML IJ SOLN
INTRAMUSCULAR | Status: AC
  Filled 2017-02-16: qty 1

## 2017-02-16 MED ORDER — MOXIFLOXACIN HCL 0.5 % OP SOLN
OPHTHALMIC | Status: AC
  Filled 2017-02-16: qty 3

## 2017-02-16 MED ORDER — TRYPAN BLUE 0.06 % OP SOLN
OPHTHALMIC | Status: AC
  Filled 2017-02-16: qty 0.5

## 2017-02-16 MED ORDER — POVIDONE-IODINE 5 % OP SOLN
OPHTHALMIC | Status: AC
  Filled 2017-02-16: qty 30

## 2017-02-16 MED ORDER — MIDAZOLAM HCL 2 MG/2ML IJ SOLN
INTRAMUSCULAR | Status: AC
  Filled 2017-02-16: qty 2

## 2017-02-16 MED ORDER — LIDOCAINE HCL (PF) 4 % IJ SOLN
INTRAOCULAR | Status: DC | PRN
  Administered 2017-02-16: 4 mL via OPHTHALMIC

## 2017-02-16 MED ORDER — MOXIFLOXACIN HCL 0.5 % OP SOLN: 1.0000 [drp] | OPHTHALMIC | Status: DC | PRN

## 2017-02-16 MED ORDER — ARMC OPHTHALMIC DILATING DROPS
OPHTHALMIC | Status: AC
  Filled 2017-02-16: qty 0.4

## 2017-02-16 MED ORDER — POVIDONE-IODINE 5 % OP SOLN
OPHTHALMIC | Status: DC | PRN
  Administered 2017-02-16: 1 via OPHTHALMIC

## 2017-02-16 MED ORDER — SODIUM HYALURONATE 23 MG/ML IO SOLN
INTRAOCULAR | Status: DC | PRN
  Administered 2017-02-16: 0.6 mL via INTRAOCULAR

## 2017-02-16 MED ORDER — SODIUM HYALURONATE 10 MG/ML IO SOLN
INTRAOCULAR | Status: AC
  Filled 2017-02-16: qty 0.85

## 2017-02-16 MED ORDER — CARBACHOL 0.01 % IO SOLN
INTRAOCULAR | Status: DC | PRN
  Administered 2017-02-16: 0.5 mL via INTRAOCULAR

## 2017-02-16 MED ORDER — SODIUM HYALURONATE 10 MG/ML IO SOLN
INTRAOCULAR | Status: DC | PRN
  Administered 2017-02-16: 0.85 mL via INTRAOCULAR

## 2017-02-16 MED ORDER — LIDOCAINE HCL (PF) 2 % IJ SOLN
INTRAMUSCULAR | Status: AC
  Filled 2017-02-16: qty 2

## 2017-02-16 MED ORDER — MOXIFLOXACIN HCL 0.5 % OP SOLN
OPHTHALMIC | Status: DC | PRN
  Administered 2017-02-16: 0.2 mL via OPHTHALMIC

## 2017-02-16 MED ORDER — ARMC OPHTHALMIC DILATING DROPS
1.0000 "application " | OPHTHALMIC | Status: AC
  Administered 2017-02-16 (×3): 1 via OPHTHALMIC

## 2017-02-16 SURGICAL SUPPLY — 21 items
CANNULA ANT/CHMB 27G (MISCELLANEOUS) ×2 IMPLANT
CANNULA ANT/CHMB 27GA (MISCELLANEOUS) ×6 IMPLANT
CUP MEDICINE 2OZ PLAST GRAD ST (MISCELLANEOUS) ×3 IMPLANT
DISSECTOR HYDRO NUCLEUS 50X22 (MISCELLANEOUS) ×3 IMPLANT
GLOVE BIO SURGEON STRL SZ8 (GLOVE) ×3 IMPLANT
GLOVE BIOGEL M 6.5 STRL (GLOVE) ×3 IMPLANT
GLOVE SURG LX 7.5 STRW (GLOVE) ×2
GLOVE SURG LX STRL 7.5 STRW (GLOVE) ×1 IMPLANT
GOWN STRL REUS W/ TWL LRG LVL3 (GOWN DISPOSABLE) ×2 IMPLANT
GOWN STRL REUS W/TWL LRG LVL3 (GOWN DISPOSABLE) ×6
LENS IOL TECNIS ITEC 23.5 (Intraocular Lens) ×2 IMPLANT
PACK CATARACT (MISCELLANEOUS) ×3 IMPLANT
PACK CATARACT KING (MISCELLANEOUS) ×3 IMPLANT
PACK EYE AFTER SURG (MISCELLANEOUS) ×3 IMPLANT
SOL BSS BAG (MISCELLANEOUS) ×3
SOLUTION BSS BAG (MISCELLANEOUS) ×1 IMPLANT
SYR 3ML LL SCALE MARK (SYRINGE) ×6 IMPLANT
SYR 5ML LL (SYRINGE) ×3 IMPLANT
SYR TB 1ML 27GX1/2 LL (SYRINGE) ×3 IMPLANT
WATER STERILE IRR 250ML POUR (IV SOLUTION) ×3 IMPLANT
WIPE NON LINTING 3.25X3.25 (MISCELLANEOUS) ×3 IMPLANT

## 2017-02-16 NOTE — Anesthesia Preprocedure Evaluation (Signed)
Anesthesia Evaluation  Patient identified by MRN, date of birth, ID band Patient awake    Reviewed: Allergy & Precautions, NPO status , Patient's Chart, lab work & pertinent test results, reviewed documented beta blocker date and time   Airway Mallampati: II  TM Distance: >3 FB Neck ROM: limited    Dental  (+) Poor Dentition, Lower Dentures, Upper Dentures, Chipped   Pulmonary COPD, Current Smoker,    Pulmonary exam normal        Cardiovascular hypertension, Pt. on medications and Pt. on home beta blockers + Orthopnea and + DOE  Normal cardiovascular exam Rhythm:regular     Neuro/Psych PSYCHIATRIC DISORDERS    GI/Hepatic GERD  Controlled,  Endo/Other    Renal/GU      Musculoskeletal   Abdominal   Peds  Hematology   Anesthesia Other Findings Past Medical History: No date: Cardiac dysrhythmia, unspecified     Comment: Bradycardia No date: Chronic hyponatremia No date: Clavicular fracture     Comment: History of  No date: COPD (chronic obstructive pulmonary disease) (* No date: Cough     Comment: CHRONIC No date: Gastroesophageal reflux disease No date: Generalized and unspecified atherosclerosis No date: HOH (hard of hearing)     Comment: AIDS No date: Hyperlipidemia No date: Hypertension No date: Orthopnea No date: Osteoporosis No date: Tobacco abuse   Reproductive/Obstetrics                             Anesthesia Physical  Anesthesia Plan  ASA: III  Anesthesia Plan: MAC   Post-op Pain Management:    Induction:   Airway Management Planned:   Additional Equipment:   Intra-op Plan:   Post-operative Plan:   Informed Consent: I have reviewed the patients History and Physical, chart, labs and discussed the procedure including the risks, benefits and alternatives for the proposed anesthesia with the patient or authorized representative who has indicated his/her  understanding and acceptance.     Plan Discussed with: CRNA  Anesthesia Plan Comments:         Anesthesia Quick Evaluation

## 2017-02-16 NOTE — Op Note (Signed)
OPERATIVE NOTE  [name] [id number] 02/16/2017   PREOPERATIVE DIAGNOSIS:  Nuclear sclerotic cataract right eye.  H25.11   POSTOPERATIVE DIAGNOSIS:    Nuclear sclerotic cataract right eye.     PROCEDURE:  Phacoemusification with posterior chamber intraocular lens placement of the right eye   LENS:   Implant Name Type Inv. Item Serial No. Manufacturer Lot No. LRB No. Used  LENS IOL DIOP 23.5 - [id number] Intraocular Lens LENS IOL DIOP 23.5 [id number] AMO   Right 1       PCB00 +23.5   ULTRASOUND TIME: 1 minutes 10 seconds.  CDE 7.84   SURGEON:  Benay Pillow, MD, MPH  ANESTHESIOLOGIST: Anesthesiologist: Andria Frames, MD CRNA: Courtney Paris, CRNA   ANESTHESIA:  Topical with tetracaine drops augmented with 1% preservative-free intracameral lidocaine.  ESTIMATED BLOOD LOSS: less than 1 mL.   COMPLICATIONS:  None.   DESCRIPTION OF PROCEDURE:  The patient was identified in the holding room and transported to the operating room and placed in the supine position under the operating microscope.  The right eye was identified as the operative eye and it was prepped and draped in the usual sterile ophthalmic fashion.   A 1.0 millimeter clear-corneal paracentesis was made at the 10:30 position. 0.5 ml of preservative-free 1% lidocaine with epinephrine was injected into the anterior chamber.  The anterior chamber was filled with Healon 5 viscoelastic.  A 2.4 millimeter keratome was used to make a near-clear corneal incision at the 8:00 position.  A curvilinear capsulorrhexis was made with a cystotome and capsulorrhexis forceps.  Balanced salt solution was used to hydrodissect and hydrodelineate the nucleus.   Phacoemulsification was then used in stop and chop fashion to remove the lens nucleus and epinucleus.  The remaining cortex was then removed using the irrigation and aspiration handpiece. Healon was then placed into the capsular bag to distend it for lens placement.  A lens  was then injected into the capsular bag.  The remaining viscoelastic was aspirated.   Wounds were hydrated with balanced salt solution.  The anterior chamber was inflated to a physiologic pressure with balanced salt solution.   Intracameral vigamox 0.1 mL undiluted was injected into the eye and a drop placed onto the ocular surface.  No wound leaks were noted.  The patient was taken to the recovery room in stable condition without complications of anesthesia or surgery  Benay Pillow 02/16/2017, 9:23 AM

## 2017-02-16 NOTE — Discharge Instructions (Signed)
Eye Surgery Discharge Instructions  Expect mild scratchy sensation or mild soreness. DO NOT RUB YOUR EYE!  The day of surgery:  Minimal physical activity, but bed rest is not required  No reading, computer work, or close hand work  No bending, lifting, or straining.  May watch TV  For 24 hours:  No driving, legal decisions, or alcoholic beverages  Safety precautions  Eat anything you prefer: It is better to start with liquids, then soup then solid foods.  _____ Eye patch should be worn until postoperative exam tomorrow.  ____ Solar shield eyeglasses should be worn for comfort in the sunlight/patch while sleeping  Resume all regular medications including aspirin or Coumadin if these were discontinued prior to surgery. You may shower, bathe, shave, or wash your hair. Tylenol may be taken for mild discomfort.  Call your doctor if you experience significant pain, nausea, or vomiting, fever > 101 or other signs of infection. [phone] or [phone] Specific instructions:  Follow-up Information    Benay Pillow, MD Follow up.   Specialty:  Ophthalmology Why:  March 2 at 10:10am Contact information: [address] [place]  [address] [phone]

## 2017-02-16 NOTE — H&P (Signed)
The History and Physical notes are on paper, have been signed, and are to be scanned.   I have examined the patient and there are no changes to the H&P.   Benay Pillow 02/16/2017 8:46 AM

## 2017-02-16 NOTE — Anesthesia Post-op Follow-up Note (Cosign Needed)
Anesthesia QCDR form completed.        

## 2017-02-16 NOTE — Addendum Note (Signed)
Addendum  created 02/16/17 1129 by Gaston Dase, CRNA   Sign clinical note    

## 2017-02-16 NOTE — Anesthesia Postprocedure Evaluation (Signed)
Anesthesia Post Note  Patient: [name]  Procedure(s) Performed: Procedure(s) (LRB): CATARACT EXTRACTION PHACO AND INTRAOCULAR LENS PLACEMENT (IOC) (Right)  Patient location during evaluation: PACU Anesthesia Type: MAC Level of consciousness: awake and alert Pain management: pain level controlled Vital Signs Assessment: post-procedure vital signs reviewed and stable Respiratory status: spontaneous breathing, nonlabored ventilation, respiratory function stable and patient connected to nasal cannula oxygen Cardiovascular status: stable and blood pressure returned to baseline Anesthetic complications: no     Last Vitals:  Vitals:   02/16/17 0914 02/16/17 0927  BP: (!) 161/72 (!) 169/70  Pulse: 66 67  Resp: 20 16  Temp: 36.6 C     Last Pain:  Vitals:   02/16/17 0658  TempSrc: Oral                 Precious Haws Ladislav Caselli

## 2017-02-16 NOTE — Transfer of Care (Signed)
Immediate Anesthesia Transfer of Care Note  Patient: [name]  Procedure(s) Performed: Procedure(s) with comments: CATARACT EXTRACTION PHACO AND INTRAOCULAR LENS PLACEMENT ([place]) (Right) - [place] 1:10.9 AP% 11.0 CDE 7.84 FLUID PACK LOT # [id number]  Patient Location: PACU and Short Stay  Anesthesia Type:MAC  Level of Consciousness: awake, oriented and patient cooperative  Airway & Oxygen Therapy: Patient Spontanous Breathing  Post-op Assessment: Report given to RN and Post -op Vital signs reviewed and stable  Post vital signs: Reviewed and stable  Last Vitals:  Vitals:   02/16/17 0658  BP: (!) 205/80  Pulse: 65  Resp: 20  Temp: 36.7 C    Last Pain:  Vitals:   02/16/17 0658  TempSrc: Oral         Complications: No apparent anesthesia complications

## 2017-02-17 DIAGNOSIS — N390 Urinary tract infection, site not specified: Secondary | ICD-10-CM | POA: Diagnosis not present

## 2017-02-17 DIAGNOSIS — Z7982 Long term (current) use of aspirin: Secondary | ICD-10-CM | POA: Diagnosis not present

## 2017-02-17 DIAGNOSIS — I10 Essential (primary) hypertension: Secondary | ICD-10-CM | POA: Diagnosis not present

## 2017-02-17 DIAGNOSIS — M6281 Muscle weakness (generalized): Secondary | ICD-10-CM | POA: Diagnosis not present

## 2017-02-17 DIAGNOSIS — K219 Gastro-esophageal reflux disease without esophagitis: Secondary | ICD-10-CM | POA: Diagnosis not present

## 2017-02-17 DIAGNOSIS — R69 Illness, unspecified: Secondary | ICD-10-CM | POA: Diagnosis not present

## 2017-02-17 DIAGNOSIS — E785 Hyperlipidemia, unspecified: Secondary | ICD-10-CM | POA: Diagnosis not present

## 2017-02-17 DIAGNOSIS — J449 Chronic obstructive pulmonary disease, unspecified: Secondary | ICD-10-CM | POA: Diagnosis not present

## 2017-03-21 DIAGNOSIS — E7800 Pure hypercholesterolemia, unspecified: Secondary | ICD-10-CM | POA: Diagnosis not present

## 2017-03-21 DIAGNOSIS — R5383 Other fatigue: Secondary | ICD-10-CM | POA: Diagnosis not present

## 2017-03-21 DIAGNOSIS — Z716 Tobacco abuse counseling: Secondary | ICD-10-CM | POA: Diagnosis not present

## 2017-03-21 DIAGNOSIS — I10 Essential (primary) hypertension: Secondary | ICD-10-CM | POA: Diagnosis not present

## 2017-03-21 DIAGNOSIS — K219 Gastro-esophageal reflux disease without esophagitis: Secondary | ICD-10-CM | POA: Diagnosis not present

## 2017-03-21 DIAGNOSIS — E781 Pure hyperglyceridemia: Secondary | ICD-10-CM | POA: Diagnosis not present

## 2017-03-21 DIAGNOSIS — E784 Other hyperlipidemia: Secondary | ICD-10-CM | POA: Diagnosis not present

## 2017-03-21 DIAGNOSIS — J449 Chronic obstructive pulmonary disease, unspecified: Secondary | ICD-10-CM | POA: Diagnosis not present

## 2017-04-19 ENCOUNTER — Encounter: Payer: Self-pay | Admitting: Cardiology

## 2017-04-19 ENCOUNTER — Ambulatory Visit (INDEPENDENT_AMBULATORY_CARE_PROVIDER_SITE_OTHER): Payer: Medicare HMO | Admitting: Cardiology

## 2017-04-19 VITALS — BP 190/88 | HR 64 | Ht 62.0 in | Wt 101.8 lb

## 2017-04-19 DIAGNOSIS — Z720 Tobacco use: Secondary | ICD-10-CM | POA: Diagnosis not present

## 2017-04-19 DIAGNOSIS — J449 Chronic obstructive pulmonary disease, unspecified: Secondary | ICD-10-CM | POA: Diagnosis not present

## 2017-04-19 DIAGNOSIS — E559 Vitamin D deficiency, unspecified: Secondary | ICD-10-CM | POA: Diagnosis not present

## 2017-04-19 DIAGNOSIS — R69 Illness, unspecified: Secondary | ICD-10-CM | POA: Diagnosis not present

## 2017-04-19 DIAGNOSIS — I10 Essential (primary) hypertension: Secondary | ICD-10-CM

## 2017-04-19 DIAGNOSIS — E871 Hypo-osmolality and hyponatremia: Secondary | ICD-10-CM | POA: Diagnosis not present

## 2017-04-19 DIAGNOSIS — R001 Bradycardia, unspecified: Secondary | ICD-10-CM

## 2017-04-19 DIAGNOSIS — E7800 Pure hypercholesterolemia, unspecified: Secondary | ICD-10-CM | POA: Diagnosis not present

## 2017-04-19 DIAGNOSIS — N189 Chronic kidney disease, unspecified: Secondary | ICD-10-CM | POA: Diagnosis not present

## 2017-04-19 DIAGNOSIS — R5383 Other fatigue: Secondary | ICD-10-CM | POA: Diagnosis not present

## 2017-04-19 NOTE — Progress Notes (Signed)
Cardiology Office Note   Date:  04/19/2017   ID:  [name], DOB 16-Aug-1933, MRN [id number]  Referring Doctor:  [name], MD   Cardiologist:   Wende Bushy, MD   Reason for consultation:  Chief Complaint  Patient presents with  . other    6 month follow up. Meds reviewed by the pt. verbally. "doing well."       History of Present Illness: [name] is a 83 y.o. female who presents for Follow-up for hypertension and bradycardia   Since last visit, patient has been doing quite well. She was in the hospital back in February and was found to have hyponatremia. Several of her blood pressure medications were discontinued. She is unclear whether she is still taking losartan or not.  Patient reports that per her blood pressure log, her blood pressure has been in the 140s or lower. Even today at her PCPs office, her systolic blood pressure was 140s.  Patient denies PND, orthopnea, edema. No shortness of breath, no chest pain.  She continues to smoke. She knows that she needs to stop.   ROS:  Please see the history of present illness. Aside from mentioned under HPI, all other systems are reviewed and negative.    Past Medical History:  Diagnosis Date  . Cardiac dysrhythmia, unspecified    Bradycardia  . Chronic hyponatremia   . Clavicular fracture    History of   . COPD (chronic obstructive pulmonary disease) ([place])   . Cough    CHRONIC  . Gastroesophageal reflux disease   . Generalized and unspecified atherosclerosis   . HOH (hard of hearing)    AIDS  . Hyperlipidemia   . Hypertension   . Orthopnea   . Osteoporosis   . Tobacco abuse     Past Surgical History:  Procedure Laterality Date  . ABDOMINAL HYSTERECTOMY    . APPENDECTOMY  20+ years ago  . BACK SURGERY  2 years ago   x2  . Cardiac Event Monitor  February 2015   Sinus rhythm sinus bradycardia rates of 46-80 beats a minute. No other arrhythmias; occasional PACs with some being blocked.,  Occasional first-degree AV block.  Marland Kitchen CATARACT EXTRACTION W/PHACO Left 01/19/2017   Procedure: CATARACT EXTRACTION PHACO AND INTRAOCULAR LENS PLACEMENT ([facility]);  Surgeon: Eulogio Bear, MD;  Location: [facility] ORS;  Service: Ophthalmology;  Laterality: Left;  us00:58.3 ap%14.8 cde8.63 lot # [id number]  . CATARACT EXTRACTION W/PHACO Right 02/16/2017   Procedure: CATARACT EXTRACTION PHACO AND INTRAOCULAR LENS PLACEMENT ([facility]);  Surgeon: Eulogio Bear, MD;  Location: [facility] ORS;  Service: Ophthalmology;  Laterality: Right;  [place] 1:10.9 AP% 11.0 CDE 7.84 FLUID PACK LOT # [id number]  . TRANSTHORACIC ECHOCARDIOGRAM  01/21/2014   Normal LV size and function. No regional wall motion abnormalities. Mild aortic sclerosis with trivial regurgitation.  . VESICOVAGINAL FISTULA CLOSURE W/ TAH  20+ years ago     reports that she has been smoking.  She has a 75.00 pack-year smoking history. She has never used smokeless tobacco. She reports that she does not drink alcohol or use drugs.   family history includes Cancer in her brother; Heart attack in her sister.   Outpatient Medications Prior to Visit  Medication Sig Dispense Refill  . aspirin EC 81 MG tablet Take 81 mg by mouth daily.    . Aspirin-Salicylamide-Caffeine (BC HEADACHE POWDER PO) Take 1 packet by mouth daily as needed.     . carvedilol (COREG) 6.25 MG tablet Take  6.25 mg by mouth every evening.    . cholecalciferol (VITAMIN D) 1000 units tablet Take 1,000 Units by mouth daily.    . Omega-3 Fatty Acids (FISH OIL) 1000 MG CAPS Take 1 capsule by mouth daily.    Marland Kitchen omeprazole (PRILOSEC) 20 MG capsule Take 20 mg by mouth daily.    Marland Kitchen oxybutynin (DITROPAN) 5 MG tablet Take 5 mg by mouth 2 (two) times daily.    . pravastatin (PRAVACHOL) 40 MG tablet Take 40 mg by mouth daily.     No facility-administered medications prior to visit.    Patient taking losartan 100 mg daily, likely prescribed by PCP.  Allergies: Penicillins and Sulfamethizole    PHYSICAL  EXAM: VS:  BP (!) 190/88 (BP Location: Right Arm, Patient Position: Sitting, Cuff Size: Normal)   Pulse 64   Ht 5\' 2"  (1.575 m)   Wt 101 lb 12 oz (46.2 kg)   BMI 18.61 kg/m  , Body mass index is 18.61 kg/m. Wt Readings from Last 3 Encounters:  04/19/17 101 lb 12 oz (46.2 kg)  02/07/17 100 lb (45.4 kg)  01/26/17 98 lb (44.5 kg)   GENERAL:  well developed, well nourished, not in acute distress HEENT: normocephalic, pink conjunctivae, anicteric sclerae, no xanthelasma, normal dentition, oropharynx clear NECK:  no neck vein engorgement, JVP normal, no hepatojugular reflux, carotid upstroke brisk and symmetric, no bruit, no thyromegaly, no lymphadenopathy LUNGS:  good respiratory effort, clear to auscultation bilaterally, occasional wheezing CV:  PMI not displaced, no thrills, no lifts, S1 and S2 within normal limits, no palpable S3 or S4, no murmurs, no rubs, no gallops ABD:  Soft, nontender, nondistended, normoactive bowel sounds, no abdominal aortic bruit, no hepatomegaly, no splenomegaly MS: nontender back, no kyphosis, no scoliosis, no joint deformities EXT:  2+ DP/PT pulses, no edema, no varicosities, no cyanosis, no clubbing SKIN: warm, nondiaphoretic, normal turgor, no ulcers NEUROPSYCH: alert, oriented to person, place, and time, sensory/motor grossly intact, normal mood, appropriate affect    Recent Labs: 01/24/2017: ALT 12; TSH 1.200 01/25/2017: Platelets 203 01/26/2017: BUN 11; Creatinine, Ser 0.64; Magnesium 2.0 02/16/2017: Hemoglobin 13.6; Potassium 3.9; Sodium 136   Lipid Panel No results found for: CHOL, TRIG, HDL, CHOLHDL, VLDL, LDLCALC, LDLDIRECT   Other studies Reviewed:  EKG:  The ekg from 10/18/2016 was personally reviewed by me and it revealed sinus rhythm, 60 bpm incomplete right bundle-branch block. Q waves in V1 and the 2. No significant change from EKG 02/10/2016.  Additional studies/ records that were reviewed personally reviewed by me today include:  Echo  01/21/2014: Left ventricle: The cavity size was normal. Systolic function was normal. Wall motion was normal; there were no regional wall motion abnormalities. - Aortic valve: Trivial regurgitation. - Mitral valve: Mild regurgitation.   ASSESSMENT AND PLAN: Hypertension Per patient, at home her blood pressure has been less than 924Q systolic. Continue blood pressure monitoring. Patient has been instructed to call PCP or our office if blood pressures personally greater than 683 systolic. She also needs to clarify whether she is still taking losartan. It appears that the hydrochlorothiazide was discontinued due to the hyponatremia.  Bradycardia Incomplete right bundle-branch block Sinus rhythm with no bundle-branch block on today's EKG. Heart rate is 64 bpm. Stable on lower dose of Coreg. Continue to monitor.  Aortic regurgitation, trivial No appreciable murmur noted. Continue serial evaluation.  Tobacco use We discussed the importance of smoking cessation and different strategies for quitting.  Patient aware that she has had some wheezing  and was recently prescribed an inhaler.   Current medicines are reviewed at length with the patient today.  The patient does not have concerns regarding medicines.  Labs/ tests ordered today include:  Orders Placed This Encounter  Procedures  . EKG 12-Lead    I had a lengthy and detailed discussion with the patient regarding diagnoses, prognosis, diagnostic options, treatment options , and side effects of medications.   I counseled the patient on importance of lifestyle modification including heart healthy diet, regular physical activity  , and smoking cessation.   Disposition:   FU with Cardiology In 6 months   Signed, Wende Bushy, MD  04/19/2017 3:34 PM    [place]  This note was generated in part with voice recognition software and I apologize for any typographical errors that were not detected and  corrected.

## 2017-04-19 NOTE — Patient Instructions (Signed)
Testing/Procedures: Your physician has requested that you regularly monitor and record your blood pressure readings at home. Please use the same machine at the same time of day to check your readings and record them to bring to your follow-up visit. Keep a log of your readings.   Please call Primary care physician if your blood pressure remains greater than 140/80.   Follow-Up: Your physician wants you to follow-up in: 6 months. You will receive a reminder letter in the mail two months in advance. If you don't receive a letter, please call our office to schedule the follow-up appointment.  It was a pleasure seeing you today here in the office. Please do not hesitate to give [place] a call back if you have any further questions. [place], BSN

## 2017-05-25 DIAGNOSIS — R8290 Unspecified abnormal findings in urine: Secondary | ICD-10-CM | POA: Diagnosis not present

## 2017-10-24 DIAGNOSIS — J449 Chronic obstructive pulmonary disease, unspecified: Secondary | ICD-10-CM | POA: Diagnosis not present

## 2017-10-24 DIAGNOSIS — E7800 Pure hypercholesterolemia, unspecified: Secondary | ICD-10-CM | POA: Diagnosis not present

## 2017-10-24 DIAGNOSIS — I10 Essential (primary) hypertension: Secondary | ICD-10-CM | POA: Diagnosis not present

## 2017-10-24 DIAGNOSIS — E559 Vitamin D deficiency, unspecified: Secondary | ICD-10-CM | POA: Diagnosis not present

## 2017-10-24 DIAGNOSIS — Z79899 Other long term (current) drug therapy: Secondary | ICD-10-CM | POA: Diagnosis not present

## 2017-11-15 DIAGNOSIS — R69 Illness, unspecified: Secondary | ICD-10-CM | POA: Diagnosis not present

## 2018-01-25 DIAGNOSIS — E559 Vitamin D deficiency, unspecified: Secondary | ICD-10-CM | POA: Diagnosis not present

## 2018-01-25 DIAGNOSIS — K219 Gastro-esophageal reflux disease without esophagitis: Secondary | ICD-10-CM | POA: Diagnosis not present

## 2018-01-25 DIAGNOSIS — I7090 Unspecified atherosclerosis: Secondary | ICD-10-CM | POA: Diagnosis not present

## 2018-01-25 DIAGNOSIS — I10 Essential (primary) hypertension: Secondary | ICD-10-CM | POA: Diagnosis not present

## 2018-01-25 DIAGNOSIS — J449 Chronic obstructive pulmonary disease, unspecified: Secondary | ICD-10-CM | POA: Diagnosis not present

## 2018-01-25 DIAGNOSIS — Z716 Tobacco abuse counseling: Secondary | ICD-10-CM | POA: Diagnosis not present

## 2018-01-25 DIAGNOSIS — Z1389 Encounter for screening for other disorder: Secondary | ICD-10-CM | POA: Diagnosis not present

## 2018-01-25 DIAGNOSIS — N200 Calculus of kidney: Secondary | ICD-10-CM | POA: Diagnosis not present

## 2018-01-25 DIAGNOSIS — E7800 Pure hypercholesterolemia, unspecified: Secondary | ICD-10-CM | POA: Diagnosis not present

## 2018-01-31 DIAGNOSIS — R0902 Hypoxemia: Secondary | ICD-10-CM | POA: Diagnosis not present

## 2018-01-31 DIAGNOSIS — J449 Chronic obstructive pulmonary disease, unspecified: Secondary | ICD-10-CM | POA: Diagnosis not present

## 2018-01-31 DIAGNOSIS — R69 Illness, unspecified: Secondary | ICD-10-CM | POA: Diagnosis not present

## 2018-01-31 DIAGNOSIS — R7981 Abnormal blood-gas level: Secondary | ICD-10-CM | POA: Diagnosis not present

## 2018-02-01 DIAGNOSIS — I7090 Unspecified atherosclerosis: Secondary | ICD-10-CM | POA: Diagnosis not present

## 2018-02-01 DIAGNOSIS — J449 Chronic obstructive pulmonary disease, unspecified: Secondary | ICD-10-CM | POA: Diagnosis not present

## 2018-02-01 DIAGNOSIS — K219 Gastro-esophageal reflux disease without esophagitis: Secondary | ICD-10-CM | POA: Diagnosis not present

## 2018-02-01 DIAGNOSIS — I10 Essential (primary) hypertension: Secondary | ICD-10-CM | POA: Diagnosis not present

## 2018-02-06 DIAGNOSIS — J449 Chronic obstructive pulmonary disease, unspecified: Secondary | ICD-10-CM | POA: Diagnosis not present

## 2018-03-01 DIAGNOSIS — I7090 Unspecified atherosclerosis: Secondary | ICD-10-CM | POA: Diagnosis not present

## 2018-03-01 DIAGNOSIS — J449 Chronic obstructive pulmonary disease, unspecified: Secondary | ICD-10-CM | POA: Diagnosis not present

## 2018-03-01 DIAGNOSIS — I10 Essential (primary) hypertension: Secondary | ICD-10-CM | POA: Diagnosis not present

## 2018-03-01 DIAGNOSIS — Z1389 Encounter for screening for other disorder: Secondary | ICD-10-CM | POA: Diagnosis not present

## 2018-03-01 DIAGNOSIS — K219 Gastro-esophageal reflux disease without esophagitis: Secondary | ICD-10-CM | POA: Diagnosis not present

## 2018-04-01 DIAGNOSIS — J449 Chronic obstructive pulmonary disease, unspecified: Secondary | ICD-10-CM | POA: Diagnosis not present

## 2018-04-26 DIAGNOSIS — E785 Hyperlipidemia, unspecified: Secondary | ICD-10-CM | POA: Diagnosis not present

## 2018-04-26 DIAGNOSIS — R0989 Other specified symptoms and signs involving the circulatory and respiratory systems: Secondary | ICD-10-CM | POA: Diagnosis not present

## 2018-04-26 DIAGNOSIS — J449 Chronic obstructive pulmonary disease, unspecified: Secondary | ICD-10-CM | POA: Diagnosis not present

## 2018-04-26 DIAGNOSIS — M545 Low back pain: Secondary | ICD-10-CM | POA: Diagnosis not present

## 2018-04-26 DIAGNOSIS — K219 Gastro-esophageal reflux disease without esophagitis: Secondary | ICD-10-CM | POA: Diagnosis not present

## 2018-04-26 DIAGNOSIS — E559 Vitamin D deficiency, unspecified: Secondary | ICD-10-CM | POA: Diagnosis not present

## 2018-04-26 DIAGNOSIS — I10 Essential (primary) hypertension: Secondary | ICD-10-CM | POA: Diagnosis not present

## 2018-04-26 DIAGNOSIS — I7090 Unspecified atherosclerosis: Secondary | ICD-10-CM | POA: Diagnosis not present

## 2018-04-26 DIAGNOSIS — E7800 Pure hypercholesterolemia, unspecified: Secondary | ICD-10-CM | POA: Diagnosis not present

## 2018-05-01 DIAGNOSIS — J449 Chronic obstructive pulmonary disease, unspecified: Secondary | ICD-10-CM | POA: Diagnosis not present

## 2018-06-01 DIAGNOSIS — J449 Chronic obstructive pulmonary disease, unspecified: Secondary | ICD-10-CM | POA: Diagnosis not present

## 2018-07-01 DIAGNOSIS — J449 Chronic obstructive pulmonary disease, unspecified: Secondary | ICD-10-CM | POA: Diagnosis not present

## 2018-08-01 DIAGNOSIS — J449 Chronic obstructive pulmonary disease, unspecified: Secondary | ICD-10-CM | POA: Diagnosis not present

## 2018-08-02 DIAGNOSIS — M2550 Pain in unspecified joint: Secondary | ICD-10-CM | POA: Diagnosis not present

## 2018-08-02 DIAGNOSIS — I10 Essential (primary) hypertension: Secondary | ICD-10-CM | POA: Diagnosis not present

## 2018-08-02 DIAGNOSIS — I7090 Unspecified atherosclerosis: Secondary | ICD-10-CM | POA: Diagnosis not present

## 2018-08-02 DIAGNOSIS — E7800 Pure hypercholesterolemia, unspecified: Secondary | ICD-10-CM | POA: Diagnosis not present

## 2018-08-02 DIAGNOSIS — J449 Chronic obstructive pulmonary disease, unspecified: Secondary | ICD-10-CM | POA: Diagnosis not present

## 2018-08-02 DIAGNOSIS — K219 Gastro-esophageal reflux disease without esophagitis: Secondary | ICD-10-CM | POA: Diagnosis not present

## 2018-08-02 DIAGNOSIS — R69 Illness, unspecified: Secondary | ICD-10-CM | POA: Diagnosis not present

## 2018-08-02 DIAGNOSIS — E559 Vitamin D deficiency, unspecified: Secondary | ICD-10-CM | POA: Diagnosis not present

## 2018-08-02 DIAGNOSIS — N390 Urinary tract infection, site not specified: Secondary | ICD-10-CM | POA: Diagnosis not present

## 2018-09-01 DIAGNOSIS — J449 Chronic obstructive pulmonary disease, unspecified: Secondary | ICD-10-CM | POA: Diagnosis not present

## 2018-09-05 ENCOUNTER — Inpatient Hospital Stay
Admission: EM | Admit: 2018-09-05 | Discharge: 2018-09-07 | DRG: 193 | Disposition: A | Discharge status: Home Health | Payer: Medicare HMO | Attending: Family Medicine | Admitting: Family Medicine

## 2018-09-05 ENCOUNTER — Other Ambulatory Visit: Payer: Self-pay

## 2018-09-05 ENCOUNTER — Emergency Department: Payer: Medicare HMO

## 2018-09-05 ENCOUNTER — Encounter: Payer: Self-pay | Admitting: Emergency Medicine

## 2018-09-05 DIAGNOSIS — Z23 Encounter for immunization: Secondary | ICD-10-CM

## 2018-09-05 DIAGNOSIS — I4891 Unspecified atrial fibrillation: Secondary | ICD-10-CM | POA: Diagnosis present

## 2018-09-05 DIAGNOSIS — Z9981 Dependence on supplemental oxygen: Secondary | ICD-10-CM

## 2018-09-05 DIAGNOSIS — R748 Abnormal levels of other serum enzymes: Secondary | ICD-10-CM | POA: Diagnosis not present

## 2018-09-05 DIAGNOSIS — Z882 Allergy status to sulfonamides status: Secondary | ICD-10-CM

## 2018-09-05 DIAGNOSIS — J9601 Acute respiratory failure with hypoxia: Secondary | ICD-10-CM | POA: Diagnosis not present

## 2018-09-05 DIAGNOSIS — E871 Hypo-osmolality and hyponatremia: Secondary | ICD-10-CM | POA: Diagnosis present

## 2018-09-05 DIAGNOSIS — J9621 Acute and chronic respiratory failure with hypoxia: Secondary | ICD-10-CM | POA: Diagnosis present

## 2018-09-05 DIAGNOSIS — J189 Pneumonia, unspecified organism: Secondary | ICD-10-CM | POA: Diagnosis not present

## 2018-09-05 DIAGNOSIS — M810 Age-related osteoporosis without current pathological fracture: Secondary | ICD-10-CM | POA: Diagnosis present

## 2018-09-05 DIAGNOSIS — R739 Hyperglycemia, unspecified: Secondary | ICD-10-CM | POA: Diagnosis present

## 2018-09-05 DIAGNOSIS — Z7982 Long term (current) use of aspirin: Secondary | ICD-10-CM

## 2018-09-05 DIAGNOSIS — J441 Chronic obstructive pulmonary disease with (acute) exacerbation: Secondary | ICD-10-CM | POA: Diagnosis not present

## 2018-09-05 DIAGNOSIS — I480 Paroxysmal atrial fibrillation: Secondary | ICD-10-CM | POA: Diagnosis not present

## 2018-09-05 DIAGNOSIS — I10 Essential (primary) hypertension: Secondary | ICD-10-CM | POA: Diagnosis present

## 2018-09-05 DIAGNOSIS — I499 Cardiac arrhythmia, unspecified: Secondary | ICD-10-CM | POA: Diagnosis not present

## 2018-09-05 DIAGNOSIS — Z79899 Other long term (current) drug therapy: Secondary | ICD-10-CM | POA: Diagnosis not present

## 2018-09-05 DIAGNOSIS — R0602 Shortness of breath: Secondary | ICD-10-CM | POA: Diagnosis not present

## 2018-09-05 DIAGNOSIS — R946 Abnormal results of thyroid function studies: Secondary | ICD-10-CM | POA: Diagnosis present

## 2018-09-05 DIAGNOSIS — I351 Nonrheumatic aortic (valve) insufficiency: Secondary | ICD-10-CM | POA: Diagnosis not present

## 2018-09-05 DIAGNOSIS — E876 Hypokalemia: Secondary | ICD-10-CM | POA: Diagnosis not present

## 2018-09-05 DIAGNOSIS — D649 Anemia, unspecified: Secondary | ICD-10-CM | POA: Diagnosis present

## 2018-09-05 DIAGNOSIS — H9190 Unspecified hearing loss, unspecified ear: Secondary | ICD-10-CM | POA: Diagnosis not present

## 2018-09-05 DIAGNOSIS — J440 Chronic obstructive pulmonary disease with acute lower respiratory infection: Secondary | ICD-10-CM | POA: Diagnosis not present

## 2018-09-05 DIAGNOSIS — Z880 Allergy status to penicillin: Secondary | ICD-10-CM

## 2018-09-05 DIAGNOSIS — K219 Gastro-esophageal reflux disease without esophagitis: Secondary | ICD-10-CM | POA: Diagnosis present

## 2018-09-05 DIAGNOSIS — E785 Hyperlipidemia, unspecified: Secondary | ICD-10-CM | POA: Diagnosis not present

## 2018-09-05 DIAGNOSIS — Z87891 Personal history of nicotine dependence: Secondary | ICD-10-CM

## 2018-09-05 DIAGNOSIS — I080 Rheumatic disorders of both mitral and aortic valves: Secondary | ICD-10-CM | POA: Diagnosis present

## 2018-09-05 DIAGNOSIS — R69 Illness, unspecified: Secondary | ICD-10-CM | POA: Diagnosis not present

## 2018-09-05 HISTORY — DX: Bradycardia, unspecified: R00.1

## 2018-09-05 HISTORY — DX: Chronic respiratory failure with hypoxia: J96.11

## 2018-09-05 HISTORY — DX: Nonrheumatic mitral (valve) insufficiency: I34.0

## 2018-09-05 LAB — CBC WITH DIFFERENTIAL/PLATELET
Basophils Absolute: 0 10*3/uL (ref 0–0.1)
Basophils Relative: 0 %
Eosinophils Absolute: 0 10*3/uL (ref 0–0.7)
Eosinophils Relative: 0 %
HEMATOCRIT: 28.3 % — AB (ref 35.0–47.0)
Hemoglobin: 10 g/dL — ABNORMAL LOW (ref 12.0–16.0)
LYMPHS ABS: 1.4 10*3/uL (ref 1.0–3.6)
LYMPHS PCT: 14 %
MCH: 31.5 pg (ref 26.0–34.0)
MCHC: 35.4 g/dL (ref 32.0–36.0)
MCV: 89.2 fL (ref 80.0–100.0)
Monocytes Absolute: 1.2 10*3/uL — ABNORMAL HIGH (ref 0.2–0.9)
Monocytes Relative: 12 %
NEUTROS ABS: 7.2 10*3/uL — AB (ref 1.4–6.5)
NEUTROS PCT: 74 %
PLATELETS: 159 10*3/uL (ref 150–440)
RBC: 3.17 MIL/uL — ABNORMAL LOW (ref 3.80–5.20)
RDW: 14.6 % — AB (ref 11.5–14.5)
WBC: 9.8 10*3/uL (ref 3.6–11.0)

## 2018-09-05 LAB — BASIC METABOLIC PANEL
Anion gap: 10 (ref 5–15)
BUN: 14 mg/dL (ref 8–23)
CALCIUM: 8 mg/dL — AB (ref 8.9–10.3)
CO2: 24 mmol/L (ref 22–32)
CREATININE: 0.72 mg/dL (ref 0.44–1.00)
Chloride: 93 mmol/L — ABNORMAL LOW (ref 98–111)
GFR calc Af Amer: >60 mL/min (ref >60)
GFR calc non Af Amer: >60 mL/min (ref >60)
GLUCOSE: 128 mg/dL — AB (ref 70–99)
Potassium: 3.4 mmol/L — ABNORMAL LOW (ref 3.5–5.1)
Sodium: 127 mmol/L — ABNORMAL LOW (ref 135–145)

## 2018-09-05 LAB — TROPONIN I: Troponin I: 0.07 ng/mL (ref <0.03)

## 2018-09-05 MED ORDER — DOCUSATE SODIUM 100 MG PO CAPS
100.0000 mg | ORAL_CAPSULE | Freq: Two times a day (BID) | ORAL | Status: DC
  Administered 2018-09-05 – 2018-09-07 (×3): 100 mg via ORAL
  Filled 2018-09-05 (×3): qty 1

## 2018-09-05 MED ORDER — SODIUM CHLORIDE 0.9 % IV SOLN
INTRAVENOUS | Status: DC
  Administered 2018-09-05: 23:00:00 via INTRAVENOUS

## 2018-09-05 MED ORDER — ENOXAPARIN SODIUM 40 MG/0.4ML ~~LOC~~ SOLN
40.0000 mg | SUBCUTANEOUS | Status: DC
  Administered 2018-09-05: 40 mg via SUBCUTANEOUS
  Filled 2018-09-05: qty 0.4

## 2018-09-05 MED ORDER — CARVEDILOL 6.25 MG PO TABS
6.2500 mg | ORAL_TABLET | Freq: Every evening | ORAL | Status: DC
  Filled 2018-09-05: qty 1

## 2018-09-05 MED ORDER — BISACODYL 5 MG PO TBEC: 5.0000 mg | DELAYED_RELEASE_TABLET | Freq: Every day | ORAL | Status: DC | PRN

## 2018-09-05 MED ORDER — IPRATROPIUM-ALBUTEROL 0.5-2.5 (3) MG/3ML IN SOLN
3.0000 mL | Freq: Four times a day (QID) | RESPIRATORY_TRACT | Status: DC
  Administered 2018-09-05 – 2018-09-06 (×3): 3 mL via RESPIRATORY_TRACT
  Filled 2018-09-05 (×3): qty 3

## 2018-09-05 MED ORDER — PRAVASTATIN SODIUM 20 MG PO TABS
40.0000 mg | ORAL_TABLET | Freq: Every day | ORAL | Status: DC
  Administered 2018-09-06 – 2018-09-07 (×2): 40 mg via ORAL
  Filled 2018-09-05 (×2): qty 2
  Filled 2018-09-05: qty 1

## 2018-09-05 MED ORDER — SODIUM CHLORIDE 0.9 % IV SOLN
2.0000 g | INTRAVENOUS | Status: DC
  Administered 2018-09-05: 2 g via INTRAVENOUS
  Filled 2018-09-05: qty 2

## 2018-09-05 MED ORDER — VITAMIN D 1000 UNITS PO TABS
1000.0000 [IU] | ORAL_TABLET | Freq: Every day | ORAL | Status: DC
  Administered 2018-09-06 – 2018-09-07 (×2): 1000 [IU] via ORAL
  Filled 2018-09-05 (×2): qty 1

## 2018-09-05 MED ORDER — ONDANSETRON HCL 4 MG PO TABS: 4.0000 mg | ORAL_TABLET | Freq: Four times a day (QID) | ORAL | Status: DC | PRN

## 2018-09-05 MED ORDER — ASPIRIN EC 81 MG PO TBEC
81.0000 mg | DELAYED_RELEASE_TABLET | Freq: Every day | ORAL | Status: DC
  Administered 2018-09-06 – 2018-09-07 (×2): 81 mg via ORAL
  Filled 2018-09-05 (×2): qty 1

## 2018-09-05 MED ORDER — METHYLPREDNISOLONE SODIUM SUCC 125 MG IJ SOLR
60.0000 mg | INTRAMUSCULAR | Status: DC
  Administered 2018-09-05: 23:00:00 60 mg via INTRAVENOUS
  Filled 2018-09-05: qty 2

## 2018-09-05 MED ORDER — OMEGA-3-ACID ETHYL ESTERS 1 G PO CAPS
1.0000 g | ORAL_CAPSULE | Freq: Every day | ORAL | Status: DC
  Administered 2018-09-06 – 2018-09-07 (×2): 1 g via ORAL
  Filled 2018-09-05 (×2): qty 1

## 2018-09-05 MED ORDER — ONDANSETRON HCL 4 MG/2ML IJ SOLN: 4.0000 mg | Freq: Four times a day (QID) | INTRAMUSCULAR | Status: DC | PRN

## 2018-09-05 MED ORDER — ACETAMINOPHEN 650 MG RE SUPP: 650.0000 mg | Freq: Four times a day (QID) | RECTAL | Status: DC | PRN

## 2018-09-05 MED ORDER — LEVOFLOXACIN IN D5W 750 MG/150ML IV SOLN
750.0000 mg | INTRAVENOUS | Status: DC
  Administered 2018-09-05: 750 mg via INTRAVENOUS
  Filled 2018-09-05 (×2): qty 150

## 2018-09-05 MED ORDER — TRAZODONE HCL 50 MG PO TABS: 25.0000 mg | ORAL_TABLET | Freq: Every evening | ORAL | Status: DC | PRN

## 2018-09-05 MED ORDER — SODIUM CHLORIDE 0.9 % IV SOLN: 500.0000 mg | INTRAVENOUS | Status: DC

## 2018-09-05 MED ORDER — IPRATROPIUM-ALBUTEROL 0.5-2.5 (3) MG/3ML IN SOLN
6.0000 mL | Freq: Once | RESPIRATORY_TRACT | Status: AC
  Administered 2018-09-05: 6 mL via RESPIRATORY_TRACT
  Filled 2018-09-05: qty 6

## 2018-09-05 MED ORDER — PANTOPRAZOLE SODIUM 40 MG PO TBEC
40.0000 mg | DELAYED_RELEASE_TABLET | Freq: Every day | ORAL | Status: DC
  Administered 2018-09-06 – 2018-09-07 (×2): 40 mg via ORAL
  Filled 2018-09-05 (×3): qty 1

## 2018-09-05 MED ORDER — HYDROCODONE-ACETAMINOPHEN 5-325 MG PO TABS: 1.0000 | ORAL_TABLET | ORAL | Status: DC | PRN

## 2018-09-05 MED ORDER — ACETAMINOPHEN 325 MG PO TABS: 650.0000 mg | ORAL_TABLET | Freq: Four times a day (QID) | ORAL | Status: DC | PRN

## 2018-09-05 NOTE — ED Triage Notes (Signed)
Pt presents to ED via [facility] with c/o breathing difficulty and cough. Pt reports quit smoking in Feb or March of this year. Per EMS initial O2 on patient's chronic 2L 93-94%, EMS reports gave 125 solu-medrol, 1 duoneb, and 1 albuterol en route to the hospital. Pt with hx of COPD. EMS reports after neb tx pt had wheezing bilaterally nad 100% on chronic 2L. Pt reports cough and SOB over the last several days.

## 2018-09-05 NOTE — Progress Notes (Signed)
Pharmacy Antibiotic Note  [name] is a 84 y.o. female admitted on 09/05/2018 with CAP.  Pharmacy has been consulted for levofloxacin dosing.  Plan: 2 yof with AECOPD/CAP and PCN allergy which admitting MD reported as hives. Pharmacy consulted to dose levofloxacin for CAP.  Levofloxacin 750 mg IV Q48H.   Height: 5\' 2"  (157.5 cm) Weight: 110 lb (49.9 kg) IBW/kg (Calculated) : 50.1  Temp (24hrs), Avg:98.2 F (36.8 C), Min:98.2 F (36.8 C), Max:98.2 F (36.8 C)  Recent Labs  Lab 09/05/18 1657  WBC 9.8  CREATININE 0.72    Estimated Creatinine Clearance: 42 mL/min (by C-G formula based on SCr of 0.72 mg/dL).    Allergies  Allergen Reactions  . Penicillins Other (See Comments)    Patient unsure of what reaction was. Has patient had a PCN reaction causing immediate rash, facial/tongue/throat swelling, SOB or lightheadedness with hypotension: unknown Has patient had a PCN reaction causing severe rash involving mucus membranes or skin necrosis: uknown Has patient had a PCN reaction that required hospitalization: unknown Has patient had a PCN reaction occurring within the last 10 years: No If all of the above answers are "NO", then may proceed with Cephalosporin use.   Francine Graven Other (See Comments)    Unknown reaction. Last used long time ago. Quit taking,.    Antimicrobials this admission: Ceftriaxone 2 gm IV x 1 in ED Didn't receive azithromycin according to most recent documentation (09/05/18 18:26)  Dose adjustments this admission:   Microbiology results:  BCx:   UCx:    Sputum:    MRSA PCR:   Thank you for allowing pharmacy to be a part of this patient's care.  Laural Benes, Pharm.D., BCPS Clinical Pharmacist 09/05/2018 6:25 PM

## 2018-09-05 NOTE — ED Notes (Signed)
Date and time results received: 09/05/18 5:38 PM  (use smartphrase ".now" to insert current time)  Test: Trop Critical Value: .0.07  Name of Provider Notified: Dr. Clearnce Hasten  Orders Received? Or Actions Taken?: Critical Results acknowledged

## 2018-09-05 NOTE — ED Notes (Signed)
This RN confirmed with Dr. Vianne Bulls that she wanted to D/C Rocephin order prior to completion, per Dr. Vianne Bulls she does want the Rocephin order discontinued prior to completion.

## 2018-09-05 NOTE — ED Provider Notes (Signed)
[facility] Emergency Department Provider Note  ___________________________________________   First MD Initiated Contact with Patient 09/05/18 1654     (approximate)  I have reviewed the triage vital signs and the nursing notes.   HISTORY  Chief Complaint Shortness of Breath   HPI [name] is a 84 y.o. female with a history of COPD on nasal cannula oxygen, 2 L, at home who is presenting with 2 to 3 days of worsening shortness of breath.  She denies any productive cough.  Denies any chest pain.  Says that she quit smoking this past March of February.  Found to be 90 to 93% on her baseline 2 L nasal cannula oxygen.  For EMS, she was given 1 DuoNeb, 1 regular neb and 125 mg of Solu-Medrol.  The patient says that she feels much improved and almost back to her baseline at this time.   Past Medical History:  Diagnosis Date  . Cardiac dysrhythmia, unspecified    Bradycardia  . Chronic hyponatremia   . Clavicular fracture    History of   . COPD (chronic obstructive pulmonary disease) ([place])   . Cough    CHRONIC  . Gastroesophageal reflux disease   . Generalized and unspecified atherosclerosis   . HOH (hard of hearing)    AIDS  . Hyperlipidemia   . Hypertension   . Orthopnea   . Osteoporosis   . Tobacco abuse     Patient Active Problem List   Diagnosis Date Noted  . Hypomagnesemia 01/25/2017  . Hyponatremia 01/24/2017  . Hypokalemia 01/24/2017  . UTI (urinary tract infection) 01/24/2017  . Confusion 01/24/2017  . Hypoxia 01/24/2017  . Bruit of right carotid artery; normal dopplers 08/08/2014  . Tobacco abuse   . Abnormal EKG 01/17/2014  . DOE (dyspnea on exertion) 01/17/2014  . Bradycardia 01/17/2014  . Essential hypertension   . Hyperlipidemia with target LDL less than 130     Past Surgical History:  Procedure Laterality Date  . ABDOMINAL HYSTERECTOMY    . APPENDECTOMY  20+ years ago  . BACK SURGERY  2 years ago   x2  . Cardiac  Event Monitor  February 2015   Sinus rhythm sinus bradycardia rates of 46-80 beats a minute. No other arrhythmias; occasional PACs with some being blocked., Occasional first-degree AV block.  Marland Kitchen CATARACT EXTRACTION W/PHACO Left 01/19/2017   Procedure: CATARACT EXTRACTION PHACO AND INTRAOCULAR LENS PLACEMENT ([facility]);  Surgeon: Eulogio Bear, MD;  Location: [facility] ORS;  Service: Ophthalmology;  Laterality: Left;  us00:58.3 ap%14.8 cde8.63 lot # [id number]  . CATARACT EXTRACTION W/PHACO Right 02/16/2017   Procedure: CATARACT EXTRACTION PHACO AND INTRAOCULAR LENS PLACEMENT ([facility]);  Surgeon: Eulogio Bear, MD;  Location: [facility] ORS;  Service: Ophthalmology;  Laterality: Right;  [place] 1:10.9 AP% 11.0 CDE 7.84 FLUID PACK LOT # [id number]  . TRANSTHORACIC ECHOCARDIOGRAM  01/21/2014   Normal LV size and function. No regional wall motion abnormalities. Mild aortic sclerosis with trivial regurgitation.  . VESICOVAGINAL FISTULA CLOSURE W/ TAH  20+ years ago    Prior to Admission medications   Medication Sig Start Date End Date Taking? Authorizing Provider  aspirin EC 81 MG tablet Take 81 mg by mouth daily.    [provider]  Aspirin-Salicylamide-Caffeine (BC HEADACHE POWDER PO) Take 1 packet by mouth daily as needed.     [provider]  carvedilol (COREG) 6.25 MG tablet Take 6.25 mg by mouth every evening.    [provider]  cholecalciferol (  VITAMIN D) 1000 units tablet Take 1,000 Units by mouth daily.    [provider]  Omega-3 Fatty Acids (FISH OIL) 1000 MG CAPS Take 1 capsule by mouth daily.    [provider]  omeprazole (PRILOSEC) 20 MG capsule Take 20 mg by mouth daily.    [provider]  oxybutynin (DITROPAN) 5 MG tablet Take 5 mg by mouth 2 (two) times daily. 12/26/16   [provider]  pravastatin (PRAVACHOL) 40 MG tablet Take 40 mg by mouth daily.    [provider]    Allergies Penicillins and Sulfamethizole  Family  History  Problem Relation Age of Onset  . Heart attack Sister   . Cancer Brother     Social History Social History   Tobacco Use  . Smoking status: Former Smoker    Packs/day: 1.50    Years: 50.00    Pack years: 75.00    Last attempt to quit: 02/16/2018    Years since quitting: 0.5  . Smokeless tobacco: Never Used  Substance Use Topics  . Alcohol use: No  . Drug use: No    Review of Systems  Constitutional: No fever/chills Eyes: No visual changes. ENT: No sore throat. Cardiovascular: Denies chest pain. Respiratory: As above Gastrointestinal: No abdominal pain.  No nausea, no vomiting.  No diarrhea.  No constipation. Genitourinary: Negative for dysuria. Musculoskeletal: Negative for back pain. Skin: Negative for rash. Neurological: Negative for headaches, focal weakness or numbness.   ____________________________________________   PHYSICAL EXAM:  VITAL SIGNS: ED Triage Vitals  Enc Vitals Group     BP --      Pulse --      Resp --      Temp --      Temp src --      SpO2 09/05/18 1650 93 %     Weight 09/05/18 1654 110 lb (49.9 kg)     Height 09/05/18 1654 5\' 2"  (1.575 m)     Head Circumference --      Peak Flow --      Pain Score 09/05/18 1654 0     Pain Loc --      Pain Edu? --      Excl. in [place]? --     Constitutional: Alert and oriented. Well appearing and in no acute distress. Eyes: Conjunctivae are normal.  Head: Atraumatic. Nose: No congestion/rhinnorhea. Mouth/Throat: Mucous membranes are moist.  Neck: No stridor.   Cardiovascular: Normal rate, regular rhythm. Grossly normal heart sounds.   Respiratory: Patient with supraclavicular retractions but speaking in full sentences.  Severely decreased air movement throughout with an expiratory cough.  Prolonged expiratory phase.  Minimal wheezing throughout all fields. Gastrointestinal: Soft and nontender. No distention. No CVA tenderness. Musculoskeletal: No lower extremity tenderness nor edema.  No  joint effusions. Neurologic:  Normal speech and language. No gross focal neurologic deficits are appreciated. Skin:  Skin is warm, dry and intact. No rash noted. Psychiatric: Mood and affect are normal. Speech and behavior are normal.  ____________________________________________   LABS (all labs ordered are listed, but only abnormal results are displayed)  Labs Reviewed - No data to display ____________________________________________  EKG  ED ECG REPORT I, Doran Stabler, the attending physician, personally viewed and interpreted this ECG.   Date: 09/05/2018  EKG Time: 1655  Rate: 96  Rhythm: Appears to have wandering atrial pacemaker.  Axis: Normal  Intervals:none  ST&T Change: No ST segment elevation or depression.  No abnormal T wave  inversion.  Poor baseline in the inferior leads leaving the machine to read ST depression.  ____________________________________________  RADIOLOGY  Chest x-ray with multifocal pneumonia. ____________________________________________   PROCEDURES  Procedure(s) performed:   .Critical Care Performed by: Orbie Pyo, MD Authorized by: Orbie Pyo, MD   Critical care provider statement:    Critical care time (minutes):  45   Critical care was necessary to treat or prevent imminent or life-threatening deterioration of the following conditions:  Respiratory failure   Critical care was time spent personally by me on the following activities:  Discussions with consultants, evaluation of patient's response to treatment, examination of patient, ordering and performing treatments and interventions, ordering and review of laboratory studies, ordering and review of radiographic studies, pulse oximetry, re-evaluation of patient's condition, obtaining history from patient or surrogate and review of old charts    Critical Care performed:   ____________________________________________   INITIAL IMPRESSION / [place] / ED COURSE  Pertinent labs & imaging results that were available during my care of the patient were reviewed by me and considered in my medical decision making (see chart for details).  Differential includes, but is not limited to, viral syndrome, bronchitis including COPD exacerbation, pneumonia, reactive airway disease including asthma, CHF including exacerbation with or without pulmonary/interstitial edema, pneumothorax, ACS, thoracic trauma, and pulmonary embolism. As part of my medical decision making, I reviewed the following data within the electronic MEDICAL RECORD NUMBER Notes from prior ED visits  ----------------------------------------- 6:21 PM on 09/05/2018 -----------------------------------------  Patient persistently wheezing.  To be admitted to the hospital.  To be treated for pneumonia.  Patient as well as family understanding the diagnosis as well as treatment plan and willing to comply.  Signed out to Dr. Vella Kohler.  Patient reporting that she only has a rash to penicillin and has not had swelling or throat closure in the past. ____________________________________________   FINAL CLINICAL IMPRESSION(S) / ED DIAGNOSES  COPD.  Multifocal pneumonia.  NEW MEDICATIONS STARTED DURING THIS VISIT:  New Prescriptions   No medications on file     Note:  This document was prepared using Dragon voice recognition software and may include unintentional dictation errors.     Orbie Pyo, MD 09/05/18 [place]

## 2018-09-05 NOTE — H&P (Signed)
[name] NAME: [name]    MR#:  [id number]  DATE OF BIRTH:  1933-07-01  DATE OF ADMISSION:  09/05/2018  PRIMARY CARE PHYSICIAN: [name], MD   REQUESTING/REFERRING PHYSICIAN: Dr. Clearnce Hasten  CHIEF COMPLAINT: Shortness of breath   Chief Complaint  Patient presents with  . Shortness of Breath    HISTORY OF PRESENT ILLNESS:  [name]  is a 84 y.o. female with a known history of COPD, heavy tobacco abuse quit in March 2019, proximal aib comes in with shortness of breath getting worse for the fifth past 3 days associated with cough, increased oxygen requirement.  Patient usually on 2 L of oxygen at night but according to patient's daughter patient has been using oxygen last few days.  Noted to have decreased p.o. intake for last 3 -4 days.  PAST MEDICAL HISTORY:   Past Medical History:  Diagnosis Date  . Cardiac dysrhythmia, unspecified    Bradycardia  . Chronic hyponatremia   . Clavicular fracture    History of   . COPD (chronic obstructive pulmonary disease) ([place])   . Cough    CHRONIC  . Gastroesophageal reflux disease   . Generalized and unspecified atherosclerosis   . HOH (hard of hearing)    AIDS  . Hyperlipidemia   . Hypertension   . Orthopnea   . Osteoporosis   . Tobacco abuse     PAST SURGICAL HISTOIRY:   Past Surgical History:  Procedure Laterality Date  . ABDOMINAL HYSTERECTOMY    . APPENDECTOMY  20+ years ago  . BACK SURGERY  2 years ago   x2  . Cardiac Event Monitor  February 2015   Sinus rhythm sinus bradycardia rates of 46-80 beats a minute. No other arrhythmias; occasional PACs with some being blocked., Occasional first-degree AV block.  Marland Kitchen CATARACT EXTRACTION W/PHACO Left 01/19/2017   Procedure: CATARACT EXTRACTION PHACO AND INTRAOCULAR LENS PLACEMENT (IOC);  Surgeon: Eulogio Bear, MD;  Location: [facility] ORS;  Service: Ophthalmology;  Laterality: Left;   us00:58.3 ap%14.8 cde8.63 lot # [id number]  . CATARACT EXTRACTION W/PHACO Right 02/16/2017   Procedure: CATARACT EXTRACTION PHACO AND INTRAOCULAR LENS PLACEMENT (IOC);  Surgeon: Eulogio Bear, MD;  Location: [facility] ORS;  Service: Ophthalmology;  Laterality: Right;  [place] 1:10.9 AP% 11.0 CDE 7.84 FLUID PACK LOT # [id number]  . TRANSTHORACIC ECHOCARDIOGRAM  01/21/2014   Normal LV size and function. No regional wall motion abnormalities. Mild aortic sclerosis with trivial regurgitation.  . VESICOVAGINAL FISTULA CLOSURE W/ TAH  20+ years ago    SOCIAL HISTORY:   Social History   Tobacco Use  . Smoking status: Former Smoker    Packs/day: 1.50    Years: 50.00    Pack years: 75.00    Last attempt to quit: 02/16/2018    Years since quitting: 0.5  . Smokeless tobacco: Never Used  Substance Use Topics  . Alcohol use: No    FAMILY HISTORY:   Family History  Problem Relation Age of Onset  . Heart attack Sister   . Cancer Brother     DRUG ALLERGIES:   Allergies  Allergen Reactions  . Penicillins Other (See Comments)    Patient unsure of what reaction was. Has patient had a PCN reaction causing immediate rash, facial/tongue/throat swelling, SOB or lightheadedness with hypotension: unknown Has patient had a PCN reaction causing severe rash involving mucus membranes or skin necrosis: uknown Has patient had a PCN reaction that required  hospitalization: unknown Has patient had a PCN reaction occurring within the last 10 years: No If all of the above answers are "NO", then may proceed with Cephalosporin use.   Francine Graven Other (See Comments)    Unknown reaction. Last used long time ago. Quit taking,.    REVIEW OF SYSTEMS:  CONSTITUTIONAL: No fever, fatigue or weakness.  Poor p.o. intake for the past few days EYES: No blurred or double vision bilateral conjunctival hemorrhage .  EARS, NOSE, AND THROAT: No tinnitus or ear pain.  RESPIRATORY: Cough, shortness of  breath. CARDIOVASCULAR: No chest pain, orthopnea, edema.  GASTROINTESTINAL: No nausea, vomiting, diarrhea or abdominal pain.  GENITOURINARY: No dysuria, hematuria.  ENDOCRINE: No polyuria, nocturia,  HEMATOLOGY: No anemia, easy bruising or bleeding SKIN: No rash or lesion. MUSCULOSKELETAL: No joint pain or arthritis.   NEUROLOGIC: No tingling, numbness, weakness.  PSYCHIATRY: No anxiety or depression.   MEDICATIONS AT HOME:   Prior to Admission medications   Medication Sig Start Date End Date Taking? Authorizing Provider  aspirin EC 81 MG tablet Take 81 mg by mouth daily.    [provider]  Aspirin-Salicylamide-Caffeine (BC HEADACHE POWDER PO) Take 1 packet by mouth daily as needed.     [provider]  carvedilol (COREG) 6.25 MG tablet Take 6.25 mg by mouth every evening.    [provider]  cholecalciferol (VITAMIN D) 1000 units tablet Take 1,000 Units by mouth daily.    [provider]  Omega-3 Fatty Acids (FISH OIL) 1000 MG CAPS Take 1 capsule by mouth daily.    [provider]  omeprazole (PRILOSEC) 20 MG capsule Take 20 mg by mouth daily.    [provider]  oxybutynin (DITROPAN) 5 MG tablet Take 5 mg by mouth 2 (two) times daily. 12/26/16   [provider]  pravastatin (PRAVACHOL) 40 MG tablet Take 40 mg by mouth daily.    [provider]      VITAL SIGNS:  Blood pressure 126/66, pulse 65, temperature 98.2 F (36.8 C), temperature source Oral, resp. rate (!) 28, height 5\' 2"  (1.575 m), weight 49.9 kg, SpO2 100 %.  PHYSICAL EXAMINATION:  GENERAL:  84 y.o.-year-old patient lying in the bed with no acute distress.  EYES: Pupils equal, round, reactive to light and accommodation. No scleral icterus. Extraocular muscles intact.  HEENT: Head atraumatic, normocephalic. Oropharynx and nasopharynx clear.  NECK:  Supple, no jugular venous distention. No thyroid enlargement, no tenderness.  LUNGS:  bilateral  expiratory wheeze in all lung fields cARDIOVASCULAR: S1, S2 regular. No murmurs, rubs, or gallops.  ABDOMEN: Soft, nontender, nondistended. Bowel sounds present. No organomegaly or mass.  EXTREMITIES: No pedal edema, cyanosis, or clubbing.  NEUROLOGIC: Cranial nerves II through XII are intact. Muscle strength 5/5 in all extremities. Sensation intact. Gait not checked.  PSYCHIATRIC: The patient is alert and oriented x 3.  SKIN: No obvious rash, lesion, or ulcer.   LABORATORY PANEL:   CBC Recent Labs  Lab 09/05/18 1657  WBC 9.8  HGB 10.0*  HCT 28.3*  PLT 159   ------------------------------------------------------------------------------------------------------------------  Chemistries  Recent Labs  Lab 09/05/18 1657  NA 127*  K 3.4*  CL 93*  CO2 24  GLUCOSE 128*  BUN 14  CREATININE 0.72  CALCIUM 8.0*   ------------------------------------------------------------------------------------------------------------------  Cardiac Enzymes Recent Labs  Lab 09/05/18 1657  TROPONINI 0.07*   ------------------------------------------------------------------------------------------------------------------  RADIOLOGY:  Dg Chest 1 View  Result Date: 09/05/2018 CLINICAL DATA:  Shortness of breath EXAM: CHEST  1  VIEW COMPARISON:  01/24/2017, CT 01/24/2017 FINDINGS: Hyperinflation with emphysematous disease. Ill-defined opacity in the right upper lobe and left base suspicious for superimposed infiltrates. Stable cardiomediastinal silhouette with aortic atherosclerosis. Left apical pleural scarring. IMPRESSION: Emphysematous disease with ill-defined right upper lobe and left lung base opacities suspicious for multifocal infiltrates. Electronically Signed   By: Donavan Foil M.D.   On: 09/05/2018 17:35    EKG:   Orders placed or performed during the hospital encounter of 09/05/18  . EKG 12-Lead  . EKG 12-Lead    IMPRESSION AND PLAN:   84 year old female patient with chronic  respiratory failure on 2 L of oxygen at night, history of COPD with heavy tobacco abuse quit recently comes in because of shortness of breath, cough getting worse with increased oxygen requirement for the past 3 to 5 days, has decreased p.o. intake as per daughter.  Due to on chronic respiratory failure secondary to bilateral pneumonia Continue oxygen, IV antibiotics, surprisingly patient has no white count and daughter mentioned that she is recently treated with antibiotics for UTI, need to exclude malignancy due to her history of heavy tobacco abuse smoked for about almost 56 years.  Will order CT chest.  #2 COPD exacerbation: Continue bronchodilators, add small dose IV steroids 3.  Hyponatremia secondary to poor p.o. Intake.  Continue gentle hydration, monitor sodium patient has chronic hyponatremia, sodium fluctuates between 26 and 129 #4 history of cardiac dysrhythmia D/w daughter All the records are reviewed and case discussed with ED provider. Management plans discussed with the patient, family and they are in agreement.  CODE STATUS: full  TOTAL TIME TAKING CARE OF THIS PATIENT: 55 minutes.    Epifanio Lesches M.D on 09/05/2018 at 6:13 PM  Between 7am to 6pm - Pager - [phone]  After 6pm go to [email or web address] - password [email or web address] [facility]  Office  [phone]  CC: Primary care physician; [name], MD  Note: This dictation was prepared with Dragon dictation along with smaller phrase technology. Any transcriptional errors that result from this process are unintentional.

## 2018-09-06 ENCOUNTER — Inpatient Hospital Stay (HOSPITAL_COMMUNITY)
Admit: 2018-09-06 | Discharge: 2018-09-06 | Disposition: A | Discharge status: Home / Self Care | Payer: Medicare HMO | Attending: Family Medicine | Admitting: Family Medicine

## 2018-09-06 ENCOUNTER — Encounter: Payer: Self-pay | Admitting: Physician Assistant

## 2018-09-06 DIAGNOSIS — E785 Hyperlipidemia, unspecified: Secondary | ICD-10-CM

## 2018-09-06 DIAGNOSIS — J9601 Acute respiratory failure with hypoxia: Secondary | ICD-10-CM

## 2018-09-06 DIAGNOSIS — I4891 Unspecified atrial fibrillation: Secondary | ICD-10-CM

## 2018-09-06 DIAGNOSIS — I351 Nonrheumatic aortic (valve) insufficiency: Secondary | ICD-10-CM

## 2018-09-06 DIAGNOSIS — E876 Hypokalemia: Secondary | ICD-10-CM

## 2018-09-06 DIAGNOSIS — R748 Abnormal levels of other serum enzymes: Secondary | ICD-10-CM

## 2018-09-06 LAB — LIPID PANEL
CHOLESTEROL: 141 mg/dL (ref 0–200)
HDL: 48 mg/dL (ref >40)
LDL Cholesterol: 78 mg/dL (ref 0–99)
Total CHOL/HDL Ratio: 2.9 RATIO
Triglycerides: 77 mg/dL (ref <150)
VLDL: 15 mg/dL (ref 0–40)

## 2018-09-06 LAB — BASIC METABOLIC PANEL
ANION GAP: 9 (ref 5–15)
BUN: 13 mg/dL (ref 8–23)
CO2: 25 mmol/L (ref 22–32)
Calcium: 8.1 mg/dL — ABNORMAL LOW (ref 8.9–10.3)
Chloride: 99 mmol/L (ref 98–111)
Creatinine, Ser: 0.55 mg/dL (ref 0.44–1.00)
GFR calc Af Amer: >60 mL/min (ref >60)
GLUCOSE: 132 mg/dL — AB (ref 70–99)
Potassium: 3.5 mmol/L (ref 3.5–5.1)
Sodium: 133 mmol/L — ABNORMAL LOW (ref 135–145)

## 2018-09-06 LAB — MAGNESIUM: MAGNESIUM: 2 mg/dL (ref 1.7–2.4)

## 2018-09-06 LAB — CBC
HEMATOCRIT: 30.4 % — AB (ref 35.0–47.0)
HEMOGLOBIN: 10.5 g/dL — AB (ref 12.0–16.0)
MCH: 30.7 pg (ref 26.0–34.0)
MCHC: 34.5 g/dL (ref 32.0–36.0)
MCV: 89.1 fL (ref 80.0–100.0)
Platelets: 185 10*3/uL (ref 150–440)
RBC: 3.41 MIL/uL — ABNORMAL LOW (ref 3.80–5.20)
RDW: 14.5 % (ref 11.5–14.5)
WBC: 5.9 10*3/uL (ref 3.6–11.0)

## 2018-09-06 LAB — HEPARIN LEVEL (UNFRACTIONATED): Heparin Unfractionated: 0.17 IU/mL — ABNORMAL LOW (ref 0.30–0.70)

## 2018-09-06 LAB — PROTIME-INR
INR: 1.22
PROTHROMBIN TIME: 15.3 s — AB (ref 11.4–15.2)

## 2018-09-06 LAB — ECHOCARDIOGRAM COMPLETE
Height: 62 in
Weight: 1760 oz

## 2018-09-06 LAB — TROPONIN I
Troponin I: 0.03 ng/mL (ref <0.03)
Troponin I: 0.04 ng/mL (ref <0.03)

## 2018-09-06 LAB — GLUCOSE, CAPILLARY: Glucose-Capillary: 137 mg/dL — ABNORMAL HIGH (ref 70–99)

## 2018-09-06 LAB — APTT: aPTT: 36 seconds (ref 24–36)

## 2018-09-06 LAB — TSH: TSH: 0.225 u[IU]/mL — ABNORMAL LOW (ref 0.350–4.500)

## 2018-09-06 MED ORDER — METHYLPREDNISOLONE SODIUM SUCC 40 MG IJ SOLR
40.0000 mg | INTRAMUSCULAR | Status: DC
  Administered 2018-09-06: 18:00:00 40 mg via INTRAVENOUS
  Filled 2018-09-06: qty 1

## 2018-09-06 MED ORDER — HEPARIN (PORCINE) IN NACL 100-0.45 UNIT/ML-% IJ SOLN
900.0000 [IU]/h | INTRAMUSCULAR | Status: DC
  Administered 2018-09-06: 700 [IU]/h via INTRAVENOUS
  Filled 2018-09-06: qty 250

## 2018-09-06 MED ORDER — CARVEDILOL 3.125 MG PO TABS
6.2500 mg | ORAL_TABLET | Freq: Two times a day (BID) | ORAL | Status: DC
  Administered 2018-09-06 – 2018-09-07 (×3): 6.25 mg via ORAL
  Filled 2018-09-06 (×3): qty 2

## 2018-09-06 MED ORDER — HEPARIN BOLUS VIA INFUSION
2500.0000 [IU] | Freq: Once | INTRAVENOUS | Status: AC
  Administered 2018-09-06: 2500 [IU] via INTRAVENOUS
  Filled 2018-09-06: qty 2500

## 2018-09-06 MED ORDER — HEPARIN BOLUS VIA INFUSION
1500.0000 [IU] | Freq: Once | INTRAVENOUS | Status: AC
  Administered 2018-09-06: 1500 [IU] via INTRAVENOUS
  Filled 2018-09-06: qty 1500

## 2018-09-06 MED ORDER — INFLUENZA VAC SPLIT HIGH-DOSE 0.5 ML IM SUSY
0.5000 mL | PREFILLED_SYRINGE | INTRAMUSCULAR | Status: AC
  Administered 2018-09-07: 14:00:00 0.5 mL via INTRAMUSCULAR
  Filled 2018-09-06: qty 0.5

## 2018-09-06 MED ORDER — LEVALBUTEROL HCL 0.63 MG/3ML IN NEBU
0.6300 mg | INHALATION_SOLUTION | Freq: Three times a day (TID) | RESPIRATORY_TRACT | Status: DC
  Administered 2018-09-06 – 2018-09-07 (×3): 0.63 mg via RESPIRATORY_TRACT
  Filled 2018-09-06 (×4): qty 3

## 2018-09-06 MED ORDER — CARVEDILOL 3.125 MG PO TABS: 12.5000 mg | ORAL_TABLET | Freq: Every evening | ORAL | Status: DC

## 2018-09-06 MED ORDER — ALBUTEROL SULFATE (2.5 MG/3ML) 0.083% IN NEBU: 2.5000 mg | INHALATION_SOLUTION | RESPIRATORY_TRACT | Status: DC | PRN

## 2018-09-06 MED ORDER — IPRATROPIUM BROMIDE 0.02 % IN SOLN
0.5000 mg | Freq: Three times a day (TID) | RESPIRATORY_TRACT | Status: DC
  Administered 2018-09-06 – 2018-09-07 (×3): 0.5 mg via RESPIRATORY_TRACT
  Filled 2018-09-06 (×4): qty 2.5

## 2018-09-06 MED ORDER — POTASSIUM CHLORIDE CRYS ER 20 MEQ PO TBCR
40.0000 meq | EXTENDED_RELEASE_TABLET | Freq: Once | ORAL | Status: AC
  Administered 2018-09-06: 11:00:00 40 meq via ORAL
  Filled 2018-09-06: qty 2

## 2018-09-06 MED ORDER — METOPROLOL TARTRATE 5 MG/5ML IV SOLN
2.5000 mg | Freq: Once | INTRAVENOUS | Status: DC
  Filled 2018-09-06: qty 5

## 2018-09-06 MED ORDER — HEPARIN (PORCINE) IN NACL 100-0.45 UNIT/ML-% IJ SOLN: 10.0000 [IU]/kg/h | INTRAMUSCULAR | Status: DC

## 2018-09-06 MED ORDER — IPRATROPIUM-ALBUTEROL 0.5-2.5 (3) MG/3ML IN SOLN: 3.0000 mL | Freq: Three times a day (TID) | RESPIRATORY_TRACT | Status: DC

## 2018-09-06 NOTE — Progress Notes (Signed)
[facility] [place] NAME: [name]    MR#:  [id number]  DATE OF BIRTH:  [birth year]  SUBJECTIVE:  CHIEF COMPLAINT:   Chief Complaint  Patient presents with  . Shortness of Breath  Patient feeling better, and discussion with nursing staff-noted atrial fibrillation new onset this morning, patient without chest pain/shortness of breath/palpitations  REVIEW OF SYSTEMS:  CONSTITUTIONAL: No fever, fatigue or weakness.  EYES: No blurred or double vision.  EARS, NOSE, AND THROAT: No tinnitus or ear pain.  RESPIRATORY: No cough, shortness of breath, wheezing or hemoptysis.  CARDIOVASCULAR: No chest pain, orthopnea, edema.  GASTROINTESTINAL: No nausea, vomiting, diarrhea or abdominal pain.  GENITOURINARY: No dysuria, hematuria.  ENDOCRINE: No polyuria, nocturia,  HEMATOLOGY: No anemia, easy bruising or bleeding SKIN: No rash or lesion. MUSCULOSKELETAL: No joint pain or arthritis.   NEUROLOGIC: No tingling, numbness, weakness.  PSYCHIATRY: No anxiety or depression.   ROS  DRUG ALLERGIES:   Allergies  Allergen Reactions  . Penicillins Other (See Comments)    Patient unsure of what reaction was. Has patient had a PCN reaction causing immediate rash, facial/tongue/throat swelling, SOB or lightheadedness with hypotension: unknown Has patient had a PCN reaction causing severe rash involving mucus membranes or skin necrosis: uknown Has patient had a PCN reaction that required hospitalization: unknown Has patient had a PCN reaction occurring within the last 10 years: No If all of the above answers are "NO", then may proceed with Cephalosporin use.   Francine Graven Other (See Comments)    Unknown reaction. Last used long time ago. Quit taking,.    VITALS:  Blood pressure 134/79, pulse (!) 111, temperature 97.9 F (36.6 C), temperature source Oral, resp. rate 18, height 5\' 2"  (1.575 m), weight 49.9 kg, SpO2 93 %.  PHYSICAL EXAMINATION:   GENERAL:  84 y.o.-year-old patient lying in the bed with no acute distress.  EYES: Pupils equal, round, reactive to light and accommodation. No scleral icterus. Extraocular muscles intact.  HEENT: Head atraumatic, normocephalic. Oropharynx and nasopharynx clear.  NECK:  Supple, no jugular venous distention. No thyroid enlargement, no tenderness.  LUNGS: Normal breath sounds bilaterally, no wheezing, rales,rhonchi or crepitation. No use of accessory muscles of respiration.  CARDIOVASCULAR: S1, S2 normal. No murmurs, rubs, or gallops.  ABDOMEN: Soft, nontender, nondistended. Bowel sounds present. No organomegaly or mass.  EXTREMITIES: No pedal edema, cyanosis, or clubbing.  NEUROLOGIC: Cranial nerves II through XII are intact. Muscle strength 5/5 in all extremities. Sensation intact. Gait not checked.  PSYCHIATRIC: The patient is alert and oriented x 3.  SKIN: No obvious rash, lesion, or ulcer.   Physical Exam LABORATORY PANEL:   CBC Recent Labs  Lab 09/06/18 0527  WBC 5.9  HGB 10.5*  HCT 30.4*  PLT 185   ------------------------------------------------------------------------------------------------------------------  Chemistries  Recent Labs  Lab 09/06/18 0527  NA 133*  K 3.5  CL 99  CO2 25  GLUCOSE 132*  BUN 13  CREATININE 0.55  CALCIUM 8.1*   ------------------------------------------------------------------------------------------------------------------  Cardiac Enzymes Recent Labs  Lab 09/05/18 2335 09/06/18 0527  TROPONINI 0.04* 0.03*   ------------------------------------------------------------------------------------------------------------------  RADIOLOGY:  Dg Chest 1 View  Result Date: 09/05/2018 CLINICAL DATA:  Shortness of breath EXAM: CHEST  1 VIEW COMPARISON:  01/24/2017, CT 01/24/2017 FINDINGS: Hyperinflation with emphysematous disease. Ill-defined opacity in the right upper lobe and left base suspicious for superimposed infiltrates. Stable  cardiomediastinal silhouette with aortic atherosclerosis. Left apical pleural scarring. IMPRESSION: Emphysematous disease with ill-defined right upper  lobe and left lung base opacities suspicious for multifocal infiltrates. Electronically Signed   By: Donavan Foil M.D.   On: 09/05/2018 17:35    ASSESSMENT AND PLAN:  84 year old female patient with chronic respiratory failure on 2 L of oxygen at night, history of COPD with heavy tobacco abuse quit recently comes in because of shortness of breath, cough getting worse with increased oxygen requirement for the past 3 to 5 days, has decreased p.o. intake as per daughter.  *Acute on chronic hypoxic respiratory failure Resolved Most likely secondary to COPD exacerbation Wean O2 off as tolerated-on 2 L chronically at night   *Acute on COPD exacerbation Resolving Continue IV Solu-Medrol with tapering as tolerated, aggressive pulmonary toilet with bronchodilator therapy, mucolytic agents, empiric Levaquin, and continue close medical monitoring  *Acute new onset A. fib with RVR, mild Noted history of dysrhythmia-unknown type Start heparin drip, increase Coreg to 12.5 mg twice daily, cardiology to see, check echocardiogram, and continue close medical monitoring  *Acute hyponatremia  Repleted   Disposition at home in 1 to 2 days barring any complications   All the records are reviewed and case discussed with Care Management/Social Workerr. Management plans discussed with the patient, family and they are in agreement.  CODE STATUS: full  TOTAL TIME TAKING CARE OF THIS PATIENT: 35 minutes.     POSSIBLE D/C IN 1-2 DAYS, DEPENDING ON CLINICAL CONDITION.   Avel Peace Grae Leathers M.D on 09/06/2018   Between 7am to 6pm - Pager - [phone]  After 6pm go to [email or web address] - password [email or web address] [facility]  Office  [phone]  CC: Primary care physician; [name], MD  Note: This dictation was prepared with Dragon  dictation along with smaller phrase technology. Any transcriptional errors that result from this process are unintentional.

## 2018-09-06 NOTE — Progress Notes (Signed)
ANTICOAGULATION CONSULT NOTE - Initial Consult  Pharmacy Consult for Heparin infusion Indication: atrial fibrillation  Allergies  Allergen Reactions  . Penicillins Other (See Comments)    Patient unsure of what reaction was. Has patient had a PCN reaction causing immediate rash, facial/tongue/throat swelling, SOB or lightheadedness with hypotension: unknown Has patient had a PCN reaction causing severe rash involving mucus membranes or skin necrosis: uknown Has patient had a PCN reaction that required hospitalization: unknown Has patient had a PCN reaction occurring within the last 10 years: No If all of the above answers are "NO", then may proceed with Cephalosporin use.   Francine Graven Other (See Comments)    Unknown reaction. Last used long time ago. Quit taking,.    Patient Measurements: Height: 5\' 2"  (157.5 cm) Weight: 110 lb (49.9 kg) IBW/kg (Calculated) : 50.1 Heparin Dosing Weight: 49.9  Vital Signs: Temp: 97.9 F (36.6 C) (09/19 0900) Temp Source: Oral (09/19 0900) BP: 130/69 (09/19 1126) Pulse Rate: 99 (09/19 1126)  Labs: Recent Labs    09/05/18 1657 09/05/18 2335 09/06/18 0527  HGB 10.0*  --  10.5*  HCT 28.3*  --  30.4*  PLT 159  --  185  CREATININE 0.72  --  0.55  TROPONINI 0.07* 0.04* 0.03*    Estimated Creatinine Clearance: 42 mL/min (by C-G formula based on SCr of 0.55 mg/dL).   Medical History: Past Medical History:  Diagnosis Date  . Bradycardia    a. beta blocker-induced  . Chronic hyponatremia   . Chronic respiratory failure with hypoxia ([facility])    a. secondary to COPD; b. on 2 L via [place] at baseline  . Clavicular fracture    History of   . COPD (chronic obstructive pulmonary disease) ([place])   . Gastroesophageal reflux disease   . Generalized and unspecified atherosclerosis   . HOH (hard of hearing)    AIDS  . Hyperlipidemia   . Hypertension   . Mitral regurgitation    a. TTE 2015: LVSF nl, no RWMA, mild MR, trivial AI  . Orthopnea    . Osteoporosis   . Tobacco abuse     Medications:  No prior anticoagulant  Assessment: 84 yo female admitted with AECOPD.  Found to have a. Fib. Pre attending: note Acute new onset A. fib with RVR, mild. Noted history of dysrhythmia-unknown type.  Goal of Therapy:  Heparin level 0.3-0.7 units/ml Monitor platelets by anticoagulation protocol: Yes   Plan:  Give 2500 units bolus x 1 Start heparin infusion at 700 units/hr Continue to monitor H&H and platelets Heparin level to be drawn 8 hours after initiation at 2030 on 9/19  Forrest Moron, PharmD 09/06/2018,11:41 AM

## 2018-09-06 NOTE — Consult Note (Signed)
Cardiology Consultation:   Patient ID: [name]; [id number]; 16-Nov-1933   Admit date: 09/05/2018 Date of Consult: 09/06/2018  Primary Care Provider: [name], MD Primary Cardiologist: Former Ellyn Hack and [place] patient - consult by Fletcher Anon    Patient Profile:   [name] is a 84 y.o. female with a hx of chronic respiratory failure with hypoxia on supplemental oxygen via nasal cannula at 2 L at nighttime secondary to COPD secondary to tobacco abuse, HTN, HLD, mitral regurgitation, aortic insufficiency, incomplete RBBB, and beta blocker-induced bradycardia who is being seen today for the evaluation of new onset Afib with RVR at the request of Dr. Jerelyn Charles.  History of Present Illness:   [name] was previously followed by Dr. Ellyn Hack and transitioned to Dr. Yvone Neu for HTN and sinus bradycardia. She was last seen in 04/2017 for routine follow up. Prior echo from 2015 showed normal LV systolic function, no RWMA, trivial AI, mild MR. Prior outpatient cardiac monitoring in 2015 showed sinus rhythm with a heart rate range of 36-105 bpm, occasional PACs with some blocked PACs, occasional 1st degree AV block, no significant arrhythmia.   Patient was admitted on 09/05/18 with a COPD exacerbation. Patient denies any significant change in her baseline SOB, though reports her daughter noted the patient was more SOB. She has stable 2-pillow orthopnea and denies any lower extremity swelling. No chest pain or palpitations. No recent falls, BRBPR, melena, hematuria, or hematemesis. CXR upon admission concerning for multifocal PNA. Initial EKG upon admission showed NSR, incomplete RBBB, occasional PACs, nonspecific st/t changes, baseline wandering and artifact. Labs showed a minimally elevated troponin with a peak of 0.07 and down trending. WBC 9.8-->5.9, HGB 10-->10.5, PLT 149, Na+ 127-->133, K+ 3.4-->3.5, glucose 128-->132. She has been placed on Rocephin and transitioned to Levaquin per IM.  She has been receiving nebs and IV steroids. Patient was placed on telemetry around midnight on 9/19 and noted to be in new onset Afib with RVR with heart rates ranging from the 90s to 140s bpm. Repeat EKG on 09/06/18 showed Afib with RVR, 107 bpm, incomplete RBBB, poor R wave progression, nonspecific st/t changes. She has been started on a heparin gtt per IM. Echo is pending. She denies any worsening SOB or palpitations.    Past Medical History:  Diagnosis Date  . Bradycardia    a. beta blocker-induced  . Chronic hyponatremia   . Chronic respiratory failure with hypoxia (HCC)    a. secondary to COPD; b. on 2 L via [place] at baseline  . Clavicular fracture    History of   . COPD (chronic obstructive pulmonary disease) ([place])   . Gastroesophageal reflux disease   . Generalized and unspecified atherosclerosis   . HOH (hard of hearing)    AIDS  . Hyperlipidemia   . Hypertension   . Mitral regurgitation    a. TTE 2015: LVSF nl, no RWMA, mild MR, trivial AI  . Orthopnea   . Osteoporosis   . Tobacco abuse     Past Surgical History:  Procedure Laterality Date  . ABDOMINAL HYSTERECTOMY    . APPENDECTOMY  20+ years ago  . BACK SURGERY  2 years ago   x2  . Cardiac Event Monitor  February 2015   Sinus rhythm sinus bradycardia rates of 46-80 beats a minute. No other arrhythmias; occasional PACs with some being blocked., Occasional first-degree AV block.  Marland Kitchen CATARACT EXTRACTION W/PHACO Left 01/19/2017   Procedure: CATARACT EXTRACTION PHACO AND INTRAOCULAR LENS PLACEMENT (  [place]);  Surgeon: Eulogio Bear, MD;  Location: [facility] ORS;  Service: Ophthalmology;  Laterality: Left;  us00:58.3 ap%14.8 cde8.63 lot # [id number]  . CATARACT EXTRACTION W/PHACO Right 02/16/2017   Procedure: CATARACT EXTRACTION PHACO AND INTRAOCULAR LENS PLACEMENT ([facility]);  Surgeon: Eulogio Bear, MD;  Location: [facility] ORS;  Service: Ophthalmology;  Laterality: Right;  [place] 1:10.9 AP% 11.0 CDE 7.84 FLUID PACK LOT # [id number]  .  TRANSTHORACIC ECHOCARDIOGRAM  01/21/2014   Normal LV size and function. No regional wall motion abnormalities. Mild aortic sclerosis with trivial regurgitation.  . VESICOVAGINAL FISTULA CLOSURE W/ TAH  20+ years ago     Home Meds: Prior to Admission medications   Medication Sig Start Date End Date Taking? Authorizing Provider  albuterol (PROVENTIL HFA;VENTOLIN HFA) 108 (90 Base) MCG/ACT inhaler Inhale 1-2 puffs into the lungs every 6 (six) hours as needed for wheezing or shortness of breath.   Yes [provider]  aspirin EC 81 MG tablet Take 81 mg by mouth daily.   Yes [provider]  budesonide-formoterol (SYMBICORT) 160-4.5 MCG/ACT inhaler Inhale 2 puffs into the lungs 2 (two) times daily.   Yes [provider]  carvedilol (COREG) 6.25 MG tablet Take 6.25 mg by mouth 2 (two) times daily.    Yes [provider]  cholecalciferol (VITAMIN D) 1000 units tablet Take 1,000 Units by mouth daily.   Yes [provider]  Omega-3 Fatty Acids (FISH OIL) 1000 MG CAPS Take 1 capsule by mouth daily.   Yes [provider]  oxybutynin (DITROPAN) 5 MG tablet Take 5 mg by mouth 2 (two) times daily. 12/26/16  Yes [provider]  pravastatin (PRAVACHOL) 40 MG tablet Take 40 mg by mouth daily.   Yes [provider]    Inpatient Medications: Scheduled Meds: . aspirin EC  81 mg Oral Daily  . carvedilol  12.5 mg Oral QPM  . cholecalciferol  1,000 Units Oral Daily  . docusate sodium  100 mg Oral BID  . [START ON 09/07/2018] Influenza vac split quadrivalent PF  0.5 mL Intramuscular Tomorrow-1000  . ipratropium  0.5 mg Nebulization TID  . levalbuterol  0.63 mg Nebulization TID  . methylPREDNISolone (SOLU-MEDROL) injection  60 mg Intravenous Q24H  . metoprolol tartrate  2.5 mg Intravenous Once  . omega-3 acid ethyl esters  1 g Oral Daily  . pantoprazole  40 mg Oral Daily  . potassium chloride  40 mEq Oral Once  . pravastatin  40 mg Oral  Daily   Continuous Infusions: . heparin    . levofloxacin (LEVAQUIN) IV Stopped (09/06/18 0110)   PRN Meds: acetaminophen **OR** acetaminophen, albuterol, bisacodyl, HYDROcodone-acetaminophen, ondansetron **OR** ondansetron (ZOFRAN) IV, traZODone  Allergies:   Allergies  Allergen Reactions  . Penicillins Other (See Comments)    Patient unsure of what reaction was. Has patient had a PCN reaction causing immediate rash, facial/tongue/throat swelling, SOB or lightheadedness with hypotension: unknown Has patient had a PCN reaction causing severe rash involving mucus membranes or skin necrosis: uknown Has patient had a PCN reaction that required hospitalization: unknown Has patient had a PCN reaction occurring within the last 10 years: No If all of the above answers are "NO", then may proceed with Cephalosporin use.   Francine Graven Other (See Comments)    Unknown reaction. Last used long time ago. Quit taking,.    Social History:   Social History   Socioeconomic History  . Marital status: Widowed    Spouse name: Not on file  .  Number of children: 5  . Years of education: Not on file  . Highest education level: Not on file  Occupational History  . Not on file  Social Needs  . Financial resource strain: Not on file  . Food insecurity:    Worry: Not on file    Inability: Not on file  . Transportation needs:    Medical: Not on file    Non-medical: Not on file  Tobacco Use  . Smoking status: Former Smoker    Packs/day: 1.50    Years: 50.00    Pack years: 75.00    Last attempt to quit: 02/16/2018    Years since quitting: 0.5  . Smokeless tobacco: Never Used  Substance and Sexual Activity  . Alcohol use: No  . Drug use: No  . Sexual activity: Never  Lifestyle  . Physical activity:    Days per week: Not on file    Minutes per session: Not on file  . Stress: Not on file  Relationships  . Social connections:    Talks on phone: Not on file    Gets together: Not on file     Attends religious service: Not on file    Active member of club or organization: Not on file    Attends meetings of clubs or organizations: Not on file    Relationship status: Not on file  . Intimate partner violence:    Fear of current or ex partner: Not on file    Emotionally abused: Not on file    Physically abused: Not on file    Forced sexual activity: Not on file  Other Topics Concern  . Not on file  Social History Narrative   She is a widowed mother of 59.     Current every day smoker - ~1 ppd x ~50 yrs   Does not drink EtOH.   Active - but no routine exercise.     Family History:   Family History  Problem Relation Age of Onset  . Diabetes Mother   . Heart disease Sister   . Heart attack Sister   . Brain cancer Brother   . ALS Daughter     ROS:  Review of Systems  Constitutional: Positive for malaise/fatigue. Negative for chills, diaphoresis, fever and weight loss.  HENT: Negative for congestion.   Eyes: Negative for discharge and redness.  Respiratory: Positive for cough, sputum production, shortness of breath and wheezing. Negative for hemoptysis.        Yellow sputum   Cardiovascular: Negative for chest pain, palpitations, orthopnea, claudication, leg swelling and PND.  Gastrointestinal: Negative for abdominal pain, blood in stool, heartburn, melena, nausea and vomiting.  Genitourinary: Negative for hematuria.  Musculoskeletal: Negative for falls and myalgias.  Skin: Negative for rash.  Neurological: Positive for weakness. Negative for dizziness, tingling, tremors, sensory change, speech change, focal weakness and loss of consciousness.  Endo/Heme/Allergies: Does not bruise/bleed easily.  Psychiatric/Behavioral: Negative for substance abuse. The patient is not nervous/anxious.   All other systems reviewed and are negative.     Physical Exam/Data:   Vitals:   09/05/18 2116 09/06/18 0757 09/06/18 0815 09/06/18 0900  BP:    134/79  Pulse:  96  (!) 111    Resp:  18  18  Temp:    97.9 F (36.6 C)  TempSrc:    Oral  SpO2: 98% 99% 94% 93%  Weight:      Height:        Intake/Output Summary (Last  24 hours) at 09/06/2018 1110 Last data filed at 09/06/2018 0901 Gross per 24 hour  Intake 970.5 ml  Output 800 ml  Net 170.5 ml   Filed Weights   09/05/18 1654  Weight: 49.9 kg   Body mass index is 20.12 kg/m.   Physical Exam: General: Well developed, well nourished, in no acute distress. Head: Normocephalic, atraumatic, sclera non-icteric, no xanthomas, nares without discharge.  Neck: Negative for carotid bruits. JVD not elevated. Lungs: Diminished, coarse breath sounds bilaterally. Breathing is unlabored. Heart: Irregularly irregular with S1 S2. No murmurs, rubs, or gallops appreciated. Abdomen: Soft, non-tender, non-distended with normoactive bowel sounds. No hepatomegaly. No rebound/guarding. No obvious abdominal masses. Msk:  Strength and tone appear normal for age. Extremities: No clubbing or cyanosis. No edema. Distal pedal pulses are 2+ and equal bilaterally. Neuro: Alert and oriented X 3. No facial asymmetry. No focal deficit. Moves all extremities spontaneously. Psych:  Responds to questions appropriately with a normal affect.   EKG:  The EKG was personally reviewed and demonstrates: NSR, incomplete RBBB, occasional PACs, nonspecific st/t changes. Repeat EKG on 9/19 showed Afib with RVR, 107 bpm, poor R wave progression through the precordial leads, nonspecific st/t changes Telemetry:  Telemetry was personally reviewed and demonstrates: Afib  Weights: Filed Weights   09/05/18 1654  Weight: 49.9 kg    Relevant CV Studies:  2-D Echo 01/2014: Study Conclusions  - Left ventricle: The cavity size was normal. Systolic function was normal. Wall motion was normal; there were no regional wall motion abnormalities. - Aortic valve: Trivial regurgitation. - Mitral valve: Mild regurgitation. __________  Laboratory  Data:  Chemistry Recent Labs  Lab 09/05/18 1657 09/06/18 0527  NA 127* 133*  K 3.4* 3.5  CL 93* 99  CO2 24 25  GLUCOSE 128* 132*  BUN 14 13  CREATININE 0.72 0.55  CALCIUM 8.0* 8.1*  GFRNONAA >60 >60  GFRAA >60 >60  ANIONGAP 10 9    No results for input(s): PROT, ALBUMIN, AST, ALT, ALKPHOS, BILITOT in the last 168 hours. Hematology Recent Labs  Lab 09/05/18 1657 09/06/18 0527  WBC 9.8 5.9  RBC 3.17* 3.41*  HGB 10.0* 10.5*  HCT 28.3* 30.4*  MCV 89.2 89.1  MCH 31.5 30.7  MCHC 35.4 34.5  RDW 14.6* 14.5  PLT 159 185   Cardiac Enzymes Recent Labs  Lab 09/05/18 1657 09/05/18 2335 09/06/18 0527  TROPONINI 0.07* 0.04* 0.03*   No results for input(s): TROPIPOC in the last 168 hours.  BNPNo results for input(s): BNP, PROBNP in the last 168 hours.  DDimer No results for input(s): DDIMER in the last 168 hours.  Radiology/Studies:  Dg Chest 1 View  Result Date: 09/05/2018 IMPRESSION: Emphysematous disease with ill-defined right upper lobe and left lung base opacities suspicious for multifocal infiltrates. Electronically Signed   By: Donavan Foil M.D.   On: 09/05/2018 17:35    Assessment and Plan:   1. New onset Afib with RVR: -Admitted in sinus rhythm. Placed on telemetry around midnight on 9/19 and noted to be in Afib at that time. Ventricular rates have peaked into the 140s bpm. Currently, she remains in Afib with ventricular rates in the 90s bpm -Discontinue IV metoprolol given reasonably controlled ventricular rates -Change her Coreg from 12.5 mg QHS to 6.25 mg bid (has previously had issues with bradycardia on higher doses of Coreg) -CHADS2VASc at least 5 (HTN, age x 2, vascular disease, sex category) -Heparin gtt until echo is finalized and need for possible invasive procedures  have been excluded -Would transition to [place] -Once she has transitioned to [place], would likely stop ASA (pending echo)  -If her ventricular rates remain easily controlled would plan for  outpatient DCCV after she has been adequately anticoagulated without interruption for 3-4 weeks. If her ventricular rates are difficult to control or she becomes symptomatic from her Afib we would need to proceed with a TEE/DCCV prior to discharge -Given she has been in Afib for less tharn 48 hours currently, we could perform a DCCV without TEE (documented sinus rhythm in the ED with Afib noted at midnight 9/19). However, given her acute pulmonary illness, she would be unlikely to maintain sinus rhythm at this time and DCCV should be deferred until her pulmonary status is improved unless she becomes symptomatic or unstable   2. Elevated troponin: -Minimally elevated with a peak of 0.07, down trending -Echo pending -Heparin gtt as above for Afib -If echo demonstrates a preserved EF and normal wall motion, would plan for outpatient ischemic evaluation with nuclear stress testing -If echo demonstrates a reduced LVSF or WMA concerning for ischemia, she will require ischemic evaluation prior to discharge -Would ideally like for her acute pulmonary illness to be improved prior to proceeding with ischemic evaluation   3. Acute respiratory distress with hypoxia secondary to AECOPD and multifocal PNA: -ABX, nebs, and steroids per IM -Wean steroids as able in an effort to decrease Afib burden -Change albuterol to Xopenex -Baseline oxygen require of 2 L via [place] at night time -Currently on room air -Quit smoking in 02/2018 (pack years of 100-120 years) -May benefit from gentle diuresis   4. Mitral regurgitation/aortic insufficiency: -Echo pending as above  5. Essential HTN: -Blood pressure reasonably controlled -Patient is ordered for Coreg 12.5 mg q PM, will place her back on her PTA dosing of 6.25 mg bid as above  6. HLD: -Check lipid panel -Continue PTA pravastatin for now  7. Hypokalemia: -Replete to goal 4.0 -Check magnesium as above with recommendation to replete to goal of 2.0  8.  Hyponatremia: -Improving -Per IM  9. Anemia: -Monitor on anticoagulation -Per IM  10. Hyperglycemia: -Check A1c    For questions or updates, please contact [place] Please consult [email or web address] for contact info under Cardiology/STEMI.   Signed, Christell Faith, PA-C [place] Pager: [phone] 09/06/2018, 11:10 AM

## 2018-09-06 NOTE — Progress Notes (Signed)
*  PRELIMINARY RESULTS* Echocardiogram 2D Echocardiogram has been performed.  [name] 09/06/2018, 3:25 PM

## 2018-09-06 NOTE — Progress Notes (Signed)
ANTICOAGULATION CONSULT NOTE - Initial Consult  Pharmacy Consult for Heparin infusion Indication: atrial fibrillation  Allergies  Allergen Reactions  . Penicillins Other (See Comments)    Patient unsure of what reaction was. Has patient had a PCN reaction causing immediate rash, facial/tongue/throat swelling, SOB or lightheadedness with hypotension: unknown Has patient had a PCN reaction causing severe rash involving mucus membranes or skin necrosis: uknown Has patient had a PCN reaction that required hospitalization: unknown Has patient had a PCN reaction occurring within the last 10 years: No If all of the above answers are "NO", then may proceed with Cephalosporin use.   [name] Other (See Comments)    Unknown reaction. Last used long time ago. Quit taking,.    Patient Measurements: Height: 5\' 2"  (157.5 cm) Weight: 110 lb (49.9 kg) IBW/kg (Calculated) : 50.1 Heparin Dosing Weight: 49.9  Vital Signs: Temp: 97.6 F (36.4 C) (09/19 1954) Temp Source: Oral (09/19 1954) BP: 113/68 (09/19 1954) Pulse Rate: 97 (09/19 1954)  Labs: Recent Labs    09/05/18 1657 09/05/18 2335 09/06/18 0527 09/06/18 1206 09/06/18 2054  HGB 10.0*  --  10.5*  --   --   HCT 28.3*  --  30.4*  --   --   PLT 159  --  185  --   --   APTT  --   --   --  36  --   LABPROT  --   --   --  15.3*  --   INR  --   --   --  1.22  --   HEPARINUNFRC  --   --   --   --  0.17*  CREATININE 0.72  --  0.55  --   --   TROPONINI 0.07* 0.04* 0.03*  --   --     Estimated Creatinine Clearance: 42 mL/min (by C-G formula based on SCr of 0.55 mg/dL).   Medical History: Past Medical History:  Diagnosis Date  . Bradycardia    a. beta blocker-induced  . Chronic hyponatremia   . Chronic respiratory failure with hypoxia (HCC)    a. secondary to COPD; b. on 2 L via [place] at baseline  . Clavicular fracture    History of   . COPD (chronic obstructive pulmonary disease) ([place])   . Gastroesophageal reflux disease    . Generalized and unspecified atherosclerosis   . HOH (hard of hearing)    AIDS  . Hyperlipidemia   . Hypertension   . Mitral regurgitation    a. TTE 2015: LVSF nl, no RWMA, mild MR, trivial AI  . Orthopnea   . Osteoporosis   . Tobacco abuse     Medications:  No prior anticoagulant  Assessment: 84 yo female admitted with AECOPD.  Found to have a. Fib. Pre attending: note Acute new onset A. fib with RVR, mild. Noted history of dysrhythmia-unknown type.  Goal of Therapy:  Heparin level 0.3-0.7 units/ml Monitor platelets by anticoagulation protocol: Yes   Plan:  Give 2500 units bolus x 1 Start heparin infusion at 700 units/hr Continue to monitor H&H and platelets Heparin level to be drawn 8 hours after initiation at 2030 on 9/19  9/19:  HL @ 20:54 = 0.17 Will order Heparin 1500 units IV X 1 bolus and increase drip rate to 900 units/hr. Will recheck HL 8 hrs after rate change.   [name] D, PharmD 09/06/2018,10:12 PM

## 2018-09-06 NOTE — Plan of Care (Signed)
Pt with new onset of A.fib, HR up to 150. Dr salary notified.  EKG and Echo ordered.  Pt seen by cardiologist, coreg and heparin drip initiated.  During the shift HR in the 80's-90's.  Pt denies pain.  Up to bathroom with standby assist and a walker.

## 2018-09-07 DIAGNOSIS — I480 Paroxysmal atrial fibrillation: Secondary | ICD-10-CM

## 2018-09-07 LAB — BASIC METABOLIC PANEL
Anion gap: 7 (ref 5–15)
BUN: 21 mg/dL (ref 8–23)
CHLORIDE: 101 mmol/L (ref 98–111)
CO2: 24 mmol/L (ref 22–32)
Calcium: 7.8 mg/dL — ABNORMAL LOW (ref 8.9–10.3)
Creatinine, Ser: 0.76 mg/dL (ref 0.44–1.00)
GFR calc Af Amer: >60 mL/min (ref >60)
GFR calc non Af Amer: >60 mL/min (ref >60)
GLUCOSE: 143 mg/dL — AB (ref 70–99)
POTASSIUM: 4.3 mmol/L (ref 3.5–5.1)
Sodium: 132 mmol/L — ABNORMAL LOW (ref 135–145)

## 2018-09-07 LAB — CBC
HCT: 26.6 % — ABNORMAL LOW (ref 35.0–47.0)
Hemoglobin: 9.5 g/dL — ABNORMAL LOW (ref 12.0–16.0)
MCH: 31.5 pg (ref 26.0–34.0)
MCHC: 35.6 g/dL (ref 32.0–36.0)
MCV: 88.5 fL (ref 80.0–100.0)
Platelets: 191 10*3/uL (ref 150–440)
RBC: 3.01 MIL/uL — ABNORMAL LOW (ref 3.80–5.20)
RDW: 14.6 % — AB (ref 11.5–14.5)
WBC: 9.9 10*3/uL (ref 3.6–11.0)

## 2018-09-07 LAB — HEMOGLOBIN A1C
Hgb A1c MFr Bld: 5.8 % — ABNORMAL HIGH (ref 4.8–5.6)
MEAN PLASMA GLUCOSE: 120 mg/dL

## 2018-09-07 LAB — GLUCOSE, CAPILLARY: GLUCOSE-CAPILLARY: 123 mg/dL — AB (ref 70–99)

## 2018-09-07 LAB — HEPARIN LEVEL (UNFRACTIONATED): Heparin Unfractionated: 0.41 IU/mL (ref 0.30–0.70)

## 2018-09-07 LAB — T4, FREE: Free T4: 1.08 ng/dL (ref 0.82–1.77)

## 2018-09-07 MED ORDER — APIXABAN 2.5 MG PO TABS: 2.5000 mg | ORAL_TABLET | Freq: Two times a day (BID) | ORAL | 0 refills | Status: DC

## 2018-09-07 MED ORDER — PREDNISONE 10 MG (21) PO TBPK: ORAL_TABLET | ORAL | 0 refills | Status: DC

## 2018-09-07 MED ORDER — PREDNISONE 50 MG PO TABS
50.0000 mg | ORAL_TABLET | Freq: Every day | ORAL | Status: DC
  Administered 2018-09-07: 50 mg via ORAL
  Filled 2018-09-07: qty 1

## 2018-09-07 MED ORDER — LEVOFLOXACIN 750 MG PO TABS: 750.0000 mg | ORAL_TABLET | Freq: Every day | ORAL | 0 refills | Status: DC

## 2018-09-07 MED ORDER — LEVALBUTEROL HCL 0.63 MG/3ML IN NEBU: 0.6300 mg | INHALATION_SOLUTION | Freq: Three times a day (TID) | RESPIRATORY_TRACT | 1 refills | Status: DC

## 2018-09-07 MED ORDER — APIXABAN 2.5 MG PO TABS
2.5000 mg | ORAL_TABLET | Freq: Two times a day (BID) | ORAL | Status: DC
  Administered 2018-09-07: 2.5 mg via ORAL
  Filled 2018-09-07: qty 1

## 2018-09-07 MED ORDER — IPRATROPIUM BROMIDE 0.02 % IN SOLN: 0.5000 mg | Freq: Three times a day (TID) | RESPIRATORY_TRACT | 1 refills | Status: DC

## 2018-09-07 MED ORDER — PANTOPRAZOLE SODIUM 40 MG PO TBEC: 40.0000 mg | DELAYED_RELEASE_TABLET | Freq: Every day | ORAL | 0 refills | Status: DC

## 2018-09-07 NOTE — Care Management Important Message (Signed)
Copy of signed IM left with patient in room.  

## 2018-09-07 NOTE — Progress Notes (Signed)
Progress Note  Patient Name: [name] Date of Encounter: 09/07/2018  Primary Cardiologist: Former [facility] patient - consult by Fletcher Anon  Subjective   No acute overnight events. SOB improved. She converted to sinus rhythm around midnight this morning. Echo showed EF 55-60%, normal wall motion, mild AI, RVSF normal, PASP 33. TSH low at 0.225. HGB down to 9.5 this morning.   Inpatient Medications    Scheduled Meds: . aspirin EC  81 mg Oral Daily  . carvedilol  6.25 mg Oral BID WC  . cholecalciferol  1,000 Units Oral Daily  . docusate sodium  100 mg Oral BID  . Influenza vac split quadrivalent PF  0.5 mL Intramuscular Tomorrow-1000  . ipratropium  0.5 mg Nebulization TID  . levalbuterol  0.63 mg Nebulization TID  . methylPREDNISolone (SOLU-MEDROL) injection  40 mg Intravenous Q24H  . omega-3 acid ethyl esters  1 g Oral Daily  . pantoprazole  40 mg Oral Daily  . pravastatin  40 mg Oral Daily  . predniSONE  50 mg Oral Q breakfast   Continuous Infusions: . levofloxacin (LEVAQUIN) IV Stopped (09/06/18 0110)   PRN Meds: acetaminophen **OR** acetaminophen, albuterol, bisacodyl, HYDROcodone-acetaminophen, ondansetron **OR** ondansetron (ZOFRAN) IV, traZODone   Vital Signs    Vitals:   09/07/18 0515 09/07/18 0546 09/07/18 0752 09/07/18 0831  BP:    (!) 158/78  Pulse: (!) 51 (!) 54 75 77  Resp:   16 18  Temp:    97.7 F (36.5 C)  TempSrc:    Oral  SpO2:   95% 97%  Weight:      Height:        Intake/Output Summary (Last 24 hours) at 09/07/2018 0838 Last data filed at 09/07/2018 0831 Gross per 24 hour  Intake 638.09 ml  Output 101 ml  Net 537.09 ml   Filed Weights   09/05/18 1654  Weight: 49.9 kg    Telemetry    Converted to sinus rhythm around midnight on 9/20 and has remained in sinus since - Personally Reviewed  ECG    n/a - Personally Reviewed  Physical Exam   GEN: No acute distress.   Neck: No JVD. Cardiac: RRR, no murmurs, rubs, or  gallops.  Respiratory: Clear to auscultation bilaterally.  GI: Soft, nontender, non-distended.   MS: No edema; No deformity. Neuro:  Alert and oriented x 3; Nonfocal.  Psych: Normal affect.  Labs    Chemistry Recent Labs  Lab 09/05/18 1657 09/06/18 0527  NA 127* 133*  K 3.4* 3.5  CL 93* 99  CO2 24 25  GLUCOSE 128* 132*  BUN 14 13  CREATININE 0.72 0.55  CALCIUM 8.0* 8.1*  GFRNONAA >60 >60  GFRAA >60 >60  ANIONGAP 10 9     Hematology Recent Labs  Lab 09/05/18 1657 09/06/18 0527 09/07/18 0552  WBC 9.8 5.9 9.9  RBC 3.17* 3.41* 3.01*  HGB 10.0* 10.5* 9.5*  HCT 28.3* 30.4* 26.6*  MCV 89.2 89.1 88.5  MCH 31.5 30.7 31.5  MCHC 35.4 34.5 35.6  RDW 14.6* 14.5 14.6*  PLT 159 185 191    Cardiac Enzymes Recent Labs  Lab 09/05/18 1657 09/05/18 2335 09/06/18 0527  TROPONINI 0.07* 0.04* 0.03*   No results for input(s): TROPIPOC in the last 168 hours.   BNPNo results for input(s): BNP, PROBNP in the last 168 hours.   DDimer No results for input(s): DDIMER in the last 168 hours.   Radiology    Dg Chest 1 View  Result Date: 09/05/2018 IMPRESSION: Emphysematous disease with ill-defined right upper lobe and left lung base opacities suspicious for multifocal infiltrates. Electronically Signed   By: Donavan Foil M.D.   On: 09/05/2018 17:35    Cardiac Studies   2-D Echo 09/06/18: Study Conclusions  - Left ventricle: The cavity size was normal. Systolic function was normal. The estimated ejection fraction was in the range of 55% to 60%. Wall motion was normal; there were no regional wall motion abnormalities. The study is not technically sufficient to allow evaluation of LV diastolic function. - Aortic valve: There was mild regurgitation. - Left atrium: The atrium was normal in size. - Right ventricle: Systolic function was normal. - Pulmonary arteries: Systolic pressure was within the normal range. PA peak pressure: 33 mm Hg (S). __________  Patient Profile       84 y.o. female with history of chronic respiratory failure with hypoxia on supplemental oxygen via nasal cannula at 2 L at nighttime secondary to COPD secondary to tobacco abuse, HTN, HLD, mitral regurgitation, aortic insufficiency, incomplete RBBB, and beta blocker-induced bradycardia who is being seen today for the evaluation of new onset Afib with RVR.  Assessment & Plan    1. New onset Afib with RVR: -Admitted in sinus rhythm. Placed on telemetry around midnight on 9/19 and noted to be in Afib at that time. Ventricular rates peaked into the 140s bpm -Converted to NSR around midnight on 9/20 and has remained in sinus rhythm since -Overnight she was mildly bradycardic into the 40s to 50s bpm -While sitting up in the bed her heart rates are in the 70s to 80s bpm -Continue Coreg 6.25 mg bid (will not escalate given underlying bradycardia). She was on Coreg 6.25 mg bid PTA and tolerated this well  -CHADS2VASc at least 5 (HTN, age x 2, vascular disease, sex category) -Start Eliquis 2.5 mg bid (meets 2/3 reduced dosing criteria) -She will need close monitoring of her HGB -Stop ASA -Consider outpatient sleep study  -She requires further evaluation of her thyroid function as below -BMET is pending, recommend repletion of potassium to goal of > 4.0 as indicated   2. Elevated troponin: -Minimally elevated with a peak of 0.07, down trending -Echo as above with preserved LVSF and normal wall motion -Heparin gtt as above for Afib -Plan for outpatient ischemic testing once her pulmonary infection is improved -DOAC in place of ASA  3. Acute respiratory distress with hypoxia secondary to AECOPD and multifocal PNA: -ABX, nebs, and steroids per IM -Wean steroids as able in an effort to decrease Afib burden -Continue Xopenex in place of albuterol  -Baseline oxygen require of 2 L via [place] at night time -Currently on room air -Quit smoking in 02/2018 (pack years of 100-120 years) -May benefit from  gentle diuresis   4. Mitral regurgitation/aortic insufficiency: -Echo as above -Outpatient follow up  5. Essential HTN: -Blood pressure reasonably controlled -Patient is ordered for Coreg 12.5 mg q PM, will place her back on her PTA dosing of 6.25 mg bid as above  6. HLD: -LDL 78 -Continue PTA statin   7. Hypokalemia: -Replete to goal 4.0, bmet pending this morning -Magnesium at goal  8. Hyponatremia: -Improving -Per IM  9. Anemia: -Down trending -Monitor on anticoagulation -Per IM  10. Hyperglycemia: -A1c 5.8 -Outpatient follow up  11. Abnormal TSH: -Check free T4/T3 -Per IM   For questions or updates, please contact [facility] Please consult [email or web address] for contact info under Cardiology/STEMI.    Signed,  Christell Faith, PA-C [place] Pager: [phone] 09/07/2018, 8:38 AM

## 2018-09-07 NOTE — Discharge Summary (Signed)
[name] NAME: [name]    MR#:  [id number]  DATE OF BIRTH:  27-Nov-1933  DATE OF ADMISSION:  09/05/2018 ADMITTING PHYSICIAN: Epifanio Lesches, MD  DATE OF DISCHARGE: No discharge date for patient encounter.  PRIMARY CARE PHYSICIAN: [name], MD    ADMISSION DIAGNOSIS:  COPD exacerbation ([place]) [J44.1] Multifocal pneumonia [J18.9]  DISCHARGE DIAGNOSIS:  Active Problems:   COPD exacerbation ([place])   SECONDARY DIAGNOSIS:   Past Medical History:  Diagnosis Date  . Bradycardia    a. beta blocker-induced  . Chronic hyponatremia   . Chronic respiratory failure with hypoxia (HCC)    a. secondary to COPD; b. on 2 L via [place] at baseline  . Clavicular fracture    History of   . COPD (chronic obstructive pulmonary disease) ([place])   . Gastroesophageal reflux disease   . Generalized and unspecified atherosclerosis   . HOH (hard of hearing)    AIDS  . Hyperlipidemia   . Hypertension   . Mitral regurgitation    a. TTE 2015: LVSF nl, no RWMA, mild MR, trivial AI  . Orthopnea   . Osteoporosis   . Tobacco abuse     HOSPITAL COURSE:  84 year old female patient with chronic respiratory failure on 2 L of oxygen at night, history of COPD with heavy tobacco abuse quit recently comes in because of shortness of breath, cough getting worse with increased oxygen requirement for the past 3 to 5 days, has decreased p.o. intake as per daughter.  *Acute on chronic hypoxic respiratory failure Resolved Most likely secondary to COPD exacerbation Weaned to 2 L chronically at night   *Acute on COPD exacerbation Resolved Treated with steroid taper as tolerated, provided aggressive pulmonary toilet with bronchodilator therapy, mucolytic agents, empiric Levaquin, and pt did well  *Acute new onset A. fib with RVR, mild Controlled   Noted history of dysrhythmia-unknown type Treated with heparin drip bridge to Eliquis  bid, Coreg bid, Cardiology did see pt while in house, echocardiogram was nl, TSH low at .22, T4 nl, T3 pending at discharge, pt to f/u with PCP regarding thyroid issue in 3-5 days, and pt did well  *Acute hyponatremia  Repleted   DISCHARGE CONDITIONS:  stable  CONSULTS OBTAINED:  Treatment Team:  Wellington Hampshire, MD  DRUG ALLERGIES:   Allergies  Allergen Reactions  . Penicillins Other (See Comments)    Patient unsure of what reaction was. Has patient had a PCN reaction causing immediate rash, facial/tongue/throat swelling, SOB or lightheadedness with hypotension: unknown Has patient had a PCN reaction causing severe rash involving mucus membranes or skin necrosis: uknown Has patient had a PCN reaction that required hospitalization: unknown Has patient had a PCN reaction occurring within the last 10 years: No If all of the above answers are "NO", then may proceed with Cephalosporin use.   Francine Graven Other (See Comments)    Unknown reaction. Last used long time ago. Quit taking,.    DISCHARGE MEDICATIONS:   Allergies as of 09/07/2018      Reactions   Penicillins Other (See Comments)   Patient unsure of what reaction was. Has patient had a PCN reaction causing immediate rash, facial/tongue/throat swelling, SOB or lightheadedness with hypotension: unknown Has patient had a PCN reaction causing severe rash involving mucus membranes or skin necrosis: uknown Has patient had a PCN reaction that required hospitalization: unknown Has patient had a PCN reaction occurring within the last 10 years: No  If all of the above answers are "NO", then may proceed with Cephalosporin use.   Sulfamethizole Other (See Comments)   Unknown reaction. Last used long time ago. Quit taking,.      Medication List    STOP taking these medications   albuterol 108 (90 Base) MCG/ACT inhaler Commonly known as:  PROVENTIL HFA;VENTOLIN HFA   aspirin EC 81 MG tablet   omeprazole 20 MG  capsule Commonly known as:  PRILOSEC Replaced by:  pantoprazole 40 MG tablet     TAKE these medications   apixaban 2.5 MG Tabs tablet Commonly known as:  ELIQUIS Take 1 tablet (2.5 mg total) by mouth 2 (two) times daily.   budesonide-formoterol 160-4.5 MCG/ACT inhaler Commonly known as:  SYMBICORT Inhale 2 puffs into the lungs 2 (two) times daily.   carvedilol 6.25 MG tablet Commonly known as:  COREG Take 6.25 mg by mouth 2 (two) times daily.   cholecalciferol 1000 units tablet Commonly known as:  VITAMIN D Take 1,000 Units by mouth daily.   Fish Oil 1000 MG Caps Take 1 capsule by mouth daily.   ipratropium 0.02 % nebulizer solution Commonly known as:  ATROVENT Take 2.5 mLs (0.5 mg total) by nebulization 3 (three) times daily.   levalbuterol 0.63 MG/3ML nebulizer solution Commonly known as:  XOPENEX Take 3 mLs (0.63 mg total) by nebulization 3 (three) times daily.   levofloxacin 750 MG tablet Commonly known as:  LEVAQUIN Take 1 tablet (750 mg total) by mouth daily.   oxybutynin 5 MG tablet Commonly known as:  DITROPAN Take 5 mg by mouth 2 (two) times daily.   pantoprazole 40 MG tablet Commonly known as:  PROTONIX Take 1 tablet (40 mg total) by mouth daily. Start taking on:  09/08/2018 Replaces:  omeprazole 20 MG capsule   pravastatin 40 MG tablet Commonly known as:  PRAVACHOL Take 40 mg by mouth daily.   predniSONE 10 MG (21) Tbpk tablet Commonly known as:  STERAPRED UNI-PAK 21 TAB Take as directed        DISCHARGE INSTRUCTIONS:   If you experience worsening of your admission symptoms, develop shortness of breath, life threatening emergency, suicidal or homicidal thoughts you must seek medical attention immediately by calling 911 or calling your MD immediately  if symptoms less severe.  You Must read complete instructions/literature along with all the possible adverse reactions/side effects for all the Medicines you take and that have been prescribed to  you. Take any new Medicines after you have completely understood and accept all the possible adverse reactions/side effects.   Please note  You were cared for by a hospitalist during your hospital stay. If you have any questions about your discharge medications or the care you received while you were in the hospital after you are discharged, you can call the unit and asked to speak with the hospitalist on call if the hospitalist that took care of you is not available. Once you are discharged, your primary care physician will handle any further medical issues. Please note that NO REFILLS for any discharge medications will be authorized once you are discharged, as it is imperative that you return to your primary care physician (or establish a relationship with a primary care physician if you do not have one) for your aftercare needs so that they can reassess your need for medications and monitor your lab values.    Today   CHIEF COMPLAINT:   Chief Complaint  Patient presents with  . Shortness of Breath  HISTORY OF PRESENT ILLNESS:  84 y.o. female with a known history of COPD, heavy tobacco abuse quit in March 2019, proximal aib comes in with shortness of breath getting worse for the fifth past 3 days associated with cough, increased oxygen requirement.  Patient usually on 2 L of oxygen at night but according to patient's daughter patient has been using oxygen last few days.  Noted to have decreased p.o. intake for last 3 -4 days.    VITAL SIGNS:  Blood pressure (!) 158/78, pulse 77, temperature 97.7 F (36.5 C), temperature source Oral, resp. rate 18, height 5\' 2"  (1.575 m), weight 49.9 kg, SpO2 97 %.  I/O:    Intake/Output Summary (Last 24 hours) at 09/07/2018 1242 Last data filed at 09/07/2018 0945 Gross per 24 hour  Intake 431.62 ml  Output 1 ml  Net 430.62 ml    PHYSICAL EXAMINATION:  GENERAL:  84 y.o.-year-old patient lying in the bed with no acute distress.  EYES: Pupils  equal, round, reactive to light and accommodation. No scleral icterus. Extraocular muscles intact.  HEENT: Head atraumatic, normocephalic. Oropharynx and nasopharynx clear.  NECK:  Supple, no jugular venous distention. No thyroid enlargement, no tenderness.  LUNGS: Normal breath sounds bilaterally, no wheezing, rales,rhonchi or crepitation. No use of accessory muscles of respiration.  CARDIOVASCULAR: S1, S2 normal. No murmurs, rubs, or gallops.  ABDOMEN: Soft, non-tender, non-distended. Bowel sounds present. No organomegaly or mass.  EXTREMITIES: No pedal edema, cyanosis, or clubbing.  NEUROLOGIC: Cranial nerves II through XII are intact. Muscle strength 5/5 in all extremities. Sensation intact. Gait not checked.  PSYCHIATRIC: The patient is alert and oriented x 3.  SKIN: No obvious rash, lesion, or ulcer.   DATA REVIEW:   CBC Recent Labs  Lab 09/07/18 0552  WBC 9.9  HGB 9.5*  HCT 26.6*  PLT 191    Chemistries  Recent Labs  Lab 09/06/18 0527 09/07/18 0552  NA 133* 132*  K 3.5 4.3  CL 99 101  CO2 25 24  GLUCOSE 132* 143*  BUN 13 21  CREATININE 0.55 0.76  CALCIUM 8.1* 7.8*  MG 2.0  --     Cardiac Enzymes Recent Labs  Lab 09/06/18 0527  TROPONINI 0.03*    Microbiology Results  Results for orders placed or performed during the hospital encounter of 01/24/17  Urine culture     Status: Abnormal   Collection Time: 01/24/17  9:21 AM  Result Value Ref Range Status   Specimen Description URINE, RANDOM  Final   Special Requests NONE  Final   Culture MULTIPLE SPECIES PRESENT, SUGGEST RECOLLECTION (A)  Final   Report Status 01/25/2017 FINAL  Final  Urine culture     Status: Abnormal   Collection Time: 01/25/17  1:24 PM  Result Value Ref Range Status   Specimen Description URINE, RANDOM  Final   Special Requests NONE  Final   Culture MULTIPLE SPECIES PRESENT, SUGGEST RECOLLECTION (A)  Final   Report Status 01/26/2017 FINAL  Final    RADIOLOGY:  Dg Chest 1  View  Result Date: 09/05/2018 CLINICAL DATA:  Shortness of breath EXAM: CHEST  1 VIEW COMPARISON:  01/24/2017, CT 01/24/2017 FINDINGS: Hyperinflation with emphysematous disease. Ill-defined opacity in the right upper lobe and left base suspicious for superimposed infiltrates. Stable cardiomediastinal silhouette with aortic atherosclerosis. Left apical pleural scarring. IMPRESSION: Emphysematous disease with ill-defined right upper lobe and left lung base opacities suspicious for multifocal infiltrates. Electronically Signed   By: Madie Reno.D.  On: 09/05/2018 17:35    EKG:   Orders placed or performed during the hospital encounter of 09/05/18  . EKG 12-Lead  . EKG 12-Lead  . EKG 12-Lead  . EKG 12-Lead      Management plans discussed with the patient, family and they are in agreement.  CODE STATUS:     Code Status Orders  (From admission, onward)         Start     Ordered   09/05/18 1812  Full code  Continuous     09/05/18 1813        Code Status History    Date Active Date Inactive Code Status Order ID Comments User Context   01/24/2017 0857 01/26/2017 1658 Full Code [id number]  Theodoro Grist, MD Inpatient      TOTAL TIME TAKING CARE OF THIS PATIENT: 45 minutes.    Avel Peace Salary M.D on 09/07/2018 at 12:42 PM  Between 7am to 6pm - Pager - [phone]  After 6pm go to [email or web address] - password [email or web address] [facility]  Office  [phone]  CC: Primary care physician; [name], MD   Note: This dictation was prepared with Dragon dictation along with smaller phrase technology. Any transcriptional errors that result from this process are unintentional.

## 2018-09-07 NOTE — Care Management Note (Addendum)
Case Management Note  Patient Details  Name: [name] MRN: [id number] Date of Birth: [birth year]  Subjective/Objective:   Admitted to [facility] with the diagnosis of COPD. Lives with daughter, [name] [phone]). Sees Dr. Primus Bravo at [facility]. [facility] on [address], No home Health. No skilled facility. Home oxygen since fall of 2018 per [facility]. Takes care of all basic activities of daily living herself.  Daughter will transport                 Action/Plan: Needs physical therapy and nursing in the home. Discussed services with daughter at the bedside, [facility]. [name], [facility] representative updated, Daughter wants to be called to arrange appointment times. Daughter is [name] [phone])   Expected Discharge Date:  09/07/18               Expected Discharge Plan:     In-House Referral:   yes  Discharge planning Services   yes  Post Acute Care Choice:   yes Choice offered to:   daughter  DME Arranged:    DME Agency:     HH Arranged:   yes HH Agency:   [facility]   Status of Service:     If discussed at [place] of Stay Meetings, dates discussed:    Additional Comments:  Jenna Ammons, RN MSN CCM Care Management [phone] 09/07/2018, 12:42 PM

## 2018-09-07 NOTE — Progress Notes (Signed)
ANTICOAGULATION CONSULT NOTE - Initial Consult  Pharmacy Consult for Heparin infusion Indication: atrial fibrillation  Allergies  Allergen Reactions  . Penicillins Other (See Comments)    Patient unsure of what reaction was. Has patient had a PCN reaction causing immediate rash, facial/tongue/throat swelling, SOB or lightheadedness with hypotension: unknown Has patient had a PCN reaction causing severe rash involving mucus membranes or skin necrosis: uknown Has patient had a PCN reaction that required hospitalization: unknown Has patient had a PCN reaction occurring within the last 10 years: No If all of the above answers are "NO", then may proceed with Cephalosporin use.   Francine Graven Other (See Comments)    Unknown reaction. Last used long time ago. Quit taking,.    Patient Measurements: Height: 5\' 2"  (157.5 cm) Weight: 110 lb (49.9 kg) IBW/kg (Calculated) : 50.1 Heparin Dosing Weight: 49.9  Vital Signs: Temp: 98.7 F (37.1 C) (09/20 0353) Temp Source: Oral (09/19 1954) BP: 111/51 (09/20 0353) Pulse Rate: 54 (09/20 0546)  Labs: Recent Labs    09/05/18 1657 09/05/18 2335 09/06/18 0527 09/06/18 1206 09/06/18 2054 09/07/18 0552  HGB 10.0*  --  10.5*  --   --  9.5*  HCT 28.3*  --  30.4*  --   --  26.6*  PLT 159  --  185  --   --  191  APTT  --   --   --  36  --   --   LABPROT  --   --   --  15.3*  --   --   INR  --   --   --  1.22  --   --   HEPARINUNFRC  --   --   --   --  0.17* 0.41  CREATININE 0.72  --  0.55  --   --   --   TROPONINI 0.07* 0.04* 0.03*  --   --   --     Estimated Creatinine Clearance: 42 mL/min (by C-G formula based on SCr of 0.55 mg/dL).   Medical History: Past Medical History:  Diagnosis Date  . Bradycardia    a. beta blocker-induced  . Chronic hyponatremia   . Chronic respiratory failure with hypoxia (HCC)    a. secondary to COPD; b. on 2 L via [place] at baseline  . Clavicular fracture    History of   . COPD (chronic obstructive  pulmonary disease) ([place])   . Gastroesophageal reflux disease   . Generalized and unspecified atherosclerosis   . HOH (hard of hearing)    AIDS  . Hyperlipidemia   . Hypertension   . Mitral regurgitation    a. TTE 2015: LVSF nl, no RWMA, mild MR, trivial AI  . Orthopnea   . Osteoporosis   . Tobacco abuse     Medications:  No prior anticoagulant  Assessment: 84 yo female admitted with AECOPD.  Found to have a. Fib. Pre attending: note Acute new onset A. fib with RVR, mild. Noted history of dysrhythmia-unknown type.  Goal of Therapy:  Heparin level 0.3-0.7 units/ml Monitor platelets by anticoagulation protocol: Yes   Plan:  Give 2500 units bolus x 1 Start heparin infusion at 700 units/hr Continue to monitor H&H and platelets Heparin level to be drawn 8 hours after initiation at 2030 on 9/19  9/19:  HL @ 20:54 = 0.17 Will order Heparin 1500 units IV X 1 bolus and increase drip rate to 900 units/hr. Will recheck HL 8 hrs after rate change.  9/20 AM heparin level 0.41. Continue current regimen. Recheck in 8 hours to confirm.  Mahari Strahm S, PharmD 09/07/2018,6:54 AM

## 2018-09-07 NOTE — Progress Notes (Signed)
Pnt remains on heparin gtt overnight 9/19-9/20. Directed from pharmacy to bolus pnt 1500 units and increase rate to 71ml/hr last night.  Heparin in place following episode of A-flutter with a HR of 150 (9/19)and uncontrolled AFIB.   Notified Dr. Marcille Blanco this morning following pnt conversion from AFIB to NSR however bradycardic intermittently between 50-55. Instructed to continue gtt at current rate. Will continue to monitor, NSR is stable. Will notify MD of any changes.

## 2018-09-10 LAB — T3, FREE: T3, Free: 1.6 pg/mL — ABNORMAL LOW (ref 2.0–4.4)

## 2018-09-13 ENCOUNTER — Telehealth: Payer: Self-pay

## 2018-09-13 DIAGNOSIS — R739 Hyperglycemia, unspecified: Secondary | ICD-10-CM | POA: Diagnosis not present

## 2018-09-13 DIAGNOSIS — J189 Pneumonia, unspecified organism: Secondary | ICD-10-CM | POA: Diagnosis not present

## 2018-09-13 DIAGNOSIS — E7800 Pure hypercholesterolemia, unspecified: Secondary | ICD-10-CM | POA: Diagnosis not present

## 2018-09-13 NOTE — Telephone Encounter (Signed)
EMMI Follow-up: Noted on the report that the patient responded no to scheduled follow-up.  I called Ms. Ullmer as it shows two follow-up  appointments need to be made but had to leave a message. Left my contact information so she could call me at her convenience.

## 2018-09-13 NOTE — Telephone Encounter (Signed)
EMMI Follow-up: Denyce Robert called for her mother, Julena Barbour this morning and left me a message that no Physical Therapy was needed. I thought maybe she had received a call from a [facility] agency as I was calling about her follow-up appointments.  I called her back to let her know why I had called and she said her mother had seen her PCP today and that the Cardiology appointment was scheduled for Oct. 24th.  She asked if we could change the primary contact number to her cell phone [phone] as she is the one that manages her mothers appointments and gets her back and forth as her mother often forgets to relay these messages.

## 2018-09-24 DIAGNOSIS — R69 Illness, unspecified: Secondary | ICD-10-CM | POA: Diagnosis not present

## 2018-09-25 DIAGNOSIS — R69 Illness, unspecified: Secondary | ICD-10-CM | POA: Diagnosis not present

## 2018-10-01 DIAGNOSIS — J449 Chronic obstructive pulmonary disease, unspecified: Secondary | ICD-10-CM | POA: Diagnosis not present

## 2018-10-10 ENCOUNTER — Encounter: Payer: Self-pay | Admitting: Physician Assistant

## 2018-10-10 NOTE — Progress Notes (Signed)
Cardiology Office Note Date:  10/11/2018  Patient ID:  [name], [name] May 19, 1933, MRN [id number] PCP:  [name], MD  Cardiologist:  Dr. Fletcher Anon, MD    Chief Complaint: Hospital follow up  History of Present Illness: [name] is a 84 y.o. female with history of recently diagnosed Afib in 08/2018 on Eliquis, chronic respiratory failure with hypoxia on supplemental oxygen via nasal cannula at 2 L at nighttime secondary to COPD secondary to tobacco abuse, HTN, HLD, anemia, mitral regurgitation, aortic insufficiency, incomplete RBBB, and beta blocker-induced bradycardia who presents for hospital follow up after recent admission to [facility] from 9/18 to 9/20 for AECOPD and was noted to have developed new onset Afib with RVR.   Patient was previously followed by Dr. Ellyn Hack and transitioned to Dr. Yvone Neu for HTN and sinus bradycardia. She was last seen in 04/2017 for routine follow up. Prior echo from 2015 showed normal LV systolic function, no RWMA, trivial AI, mild MR. Prior outpatient cardiac monitoring in 2015 showed sinus rhythm with a heart rate range of 36-105 bpm, occasional PACs with some blocked PACs, occasional 1st degree AV block, no significant arrhythmia.   She was admitted to [facility] 08/2018 with AECOPD vs PNA. She was in sinus rhythm upon admission. During her hospitalization she was placed on telemetry and noted to be in new onset Afib with RVR with ventricular rates into the 140s bpm. Afib was confirmed on 12-lead EKG. Troponin peaked at 0.07 and was felt to be demand ischemia in the setting of her AECOPD/PNA/Afib with RVR. Echo showed an EF of 55-60%, no RWMA, mild AI, LA normal in size, RVSF normal, PASP 33. She was rate controlled with Coreg and started on Eliquis 2.5 mg bid (met reduced dosing criteria with age and weight). Prior to her discharge, she converted to sinus rhythm.   Labs during admission: TSH 0.225, free T4 normal, free T3 1.6, potassium 3.4-->4.3, magnesium  2.0, LDL 78, A1c 5.8, HGB 10.0-->9.5  Labs checked in post hospital follow up with her PCP 09/13/2018: HGB 10.7, TSH normal. SCr 0.82, K+ 4.3, LDL 72, LFT normal  She comes in accompanied by her daughter today. She is doing reasonably well. Her daughter notes the patient is doing well, though is still trying to get her energy back following the above admission. The patient denies any chest pain, SOB, palpitations, dizziness, presyncope, or syncope. She is tolerating Eliquis and Coreg without issues. No falls, BRBPR or melena.   Past Medical History:  Diagnosis Date  . Bradycardia    a. beta blocker-induced  . Chronic hyponatremia   . Chronic respiratory failure with hypoxia (HCC)    a. secondary to COPD; b. on 2 L via [place] at baseline  . Clavicular fracture    History of   . COPD (chronic obstructive pulmonary disease) ([place])   . Gastroesophageal reflux disease   . Generalized and unspecified atherosclerosis   . HOH (hard of hearing)   . Hyperlipidemia   . Hypertension   . Mitral regurgitation    a. TTE 2015: LVSF nl, no RWMA, mild MR, trivial AI; b. TTE 9/19 EF 55-60%, no RWMA, mild AI, LA normal in size, RVSF normal, PASP 33  . Orthopnea   . Osteoporosis   . PAF (paroxysmal atrial fibrillation) ([place])    a. diagnosed 9/19: CHADS2VASc at least 5 (HTN, age x 2, vascular disease, female); b. Eliquis   . Tobacco abuse     Past Surgical History:  Procedure Laterality  Date  . ABDOMINAL HYSTERECTOMY    . APPENDECTOMY  20+ years ago  . BACK SURGERY  2 years ago   x2  . Cardiac Event Monitor  February 2015   Sinus rhythm sinus bradycardia rates of 46-80 beats a minute. No other arrhythmias; occasional PACs with some being blocked., Occasional first-degree AV block.  Marland Kitchen CATARACT EXTRACTION W/PHACO Left 01/19/2017   Procedure: CATARACT EXTRACTION PHACO AND INTRAOCULAR LENS PLACEMENT (IOC);  Surgeon: Eulogio Bear, MD;  Location: [facility] ORS;  Service: Ophthalmology;  Laterality: Left;   us00:58.3 ap%14.8 cde8.63 lot # [id number]  . CATARACT EXTRACTION W/PHACO Right 02/16/2017   Procedure: CATARACT EXTRACTION PHACO AND INTRAOCULAR LENS PLACEMENT (IOC);  Surgeon: Eulogio Bear, MD;  Location: [facility] ORS;  Service: Ophthalmology;  Laterality: Right;  [place] 1:10.9 AP% 11.0 CDE 7.84 FLUID PACK LOT # [id number]  . TRANSTHORACIC ECHOCARDIOGRAM  01/21/2014   Normal LV size and function. No regional wall motion abnormalities. Mild aortic sclerosis with trivial regurgitation.  . VESICOVAGINAL FISTULA CLOSURE W/ TAH  20+ years ago    Current Meds  Medication Sig  . apixaban (ELIQUIS) 2.5 MG TABS tablet Take 1 tablet (2.5 mg total) by mouth 2 (two) times daily.  . budesonide-formoterol (SYMBICORT) 160-4.5 MCG/ACT inhaler Inhale 2 puffs into the lungs 2 (two) times daily.  . carvedilol (COREG) 6.25 MG tablet Take 6.25 mg by mouth 2 (two) times daily.   . cholecalciferol (VITAMIN D) 1000 units tablet Take 1,000 Units by mouth daily.  Marland Kitchen ipratropium (ATROVENT) 0.02 % nebulizer solution Take 2.5 mLs (0.5 mg total) by nebulization 3 (three) times daily.  Marland Kitchen levalbuterol (XOPENEX) 0.63 MG/3ML nebulizer solution Take 3 mLs (0.63 mg total) by nebulization 3 (three) times daily.  . Omega-3 Fatty Acids (FISH OIL) 1000 MG CAPS Take 1 capsule by mouth daily.  Marland Kitchen oxybutynin (DITROPAN) 5 MG tablet Take 5 mg by mouth 2 (two) times daily.  . pantoprazole (PROTONIX) 40 MG tablet Take 1 tablet (40 mg total) by mouth daily.  . pravastatin (PRAVACHOL) 40 MG tablet Take 40 mg by mouth daily.  . predniSONE (STERAPRED UNI-PAK 21 TAB) 10 MG (21) TBPK tablet Take as directed    Allergies:   Penicillins and Sulfamethizole   Social History:  The patient  reports that she quit smoking about 7 months ago. She has a 75.00 pack-year smoking history. She has never used smokeless tobacco. She reports that she does not drink alcohol or use drugs.   Family History:  The patient's family history includes ALS in her  daughter; Brain cancer in her brother; Diabetes in her mother; Heart attack in her sister; Heart disease in her sister.  ROS:   Review of Systems  Constitutional: Positive for malaise/fatigue. Negative for chills, diaphoresis, fever and weight loss.  HENT: Negative for congestion.   Eyes: Negative for discharge and redness.  Respiratory: Negative for cough, hemoptysis, sputum production, shortness of breath and wheezing.   Cardiovascular: Negative for chest pain, palpitations, orthopnea, claudication, leg swelling and PND.  Gastrointestinal: Negative for abdominal pain, blood in stool, heartburn, melena, nausea and vomiting.  Genitourinary: Negative for hematuria.  Musculoskeletal: Negative for falls and myalgias.  Skin: Negative for rash.  Neurological: Positive for weakness. Negative for dizziness, tingling, tremors, sensory change, speech change, focal weakness and loss of consciousness.  Endo/Heme/Allergies: Does not bruise/bleed easily.  Psychiatric/Behavioral: Negative for substance abuse. The patient is not nervous/anxious.   All other systems reviewed and are negative.    PHYSICAL EXAM:  VS:  BP (!) 160/80 (BP Location: Left Arm, Patient Position: Sitting, Cuff Size: Normal)   Pulse 71   Ht 5' (1.524 m)   Wt 123 lb 8 oz (56 kg)   BMI 24.12 kg/m  BMI: Body mass index is 24.12 kg/m.  Physical Exam  Constitutional: She is oriented to person, place, and time. She appears well-developed and well-nourished.  HENT:  Head: Normocephalic and atraumatic.  Eyes: Right eye exhibits no discharge. Left eye exhibits no discharge.  Neck: Normal range of motion. No JVD present.  Cardiovascular: Normal rate, regular rhythm, S1 normal, S2 normal and normal heart sounds. Exam reveals no distant heart sounds, no friction rub, no midsystolic click and no opening snap.  No murmur heard. Pulses:      Carotid pulses are on the right side with bruit, and on the left side with bruit.       Posterior tibial pulses are 2+ on the right side, and 2+ on the left side.  Pulmonary/Chest: Effort normal and breath sounds normal. No respiratory distress. She has no decreased breath sounds. She has no wheezes. She has no rales. She exhibits no tenderness.  Abdominal: Soft. She exhibits no distension. There is no tenderness.  Musculoskeletal: She exhibits no edema.  Neurological: She is alert and oriented to person, place, and time.  Skin: Skin is warm and dry. No cyanosis. Nails show no clubbing.  Psychiatric: She has a normal mood and affect. Her speech is normal and behavior is normal. Judgment and thought content normal.     EKG:  Was ordered and interpreted by me today. Shows NSR, 71 bpm, no acute st/t changes  Recent Labs: 09/06/2018: Magnesium 2.0; TSH 0.225 09/07/2018: BUN 21; Creatinine, Ser 0.76; Hemoglobin 9.5; Platelets 191; Potassium 4.3; Sodium 132  09/06/2018: Cholesterol 141; HDL 48; LDL Cholesterol 78; Total CHOL/HDL Ratio 2.9; Triglycerides 77; VLDL 15   CrCl cannot be calculated (Patient's most recent lab result is older than the maximum 21 days allowed.).   Wt Readings from Last 3 Encounters:  10/11/18 123 lb 8 oz (56 kg)  09/05/18 110 lb (49.9 kg)  04/19/17 101 lb 12 oz (46.2 kg)     Other studies reviewed: Additional studies/records reviewed today include: summarized above  ASSESSMENT AND PLAN:  1. PAF: Currently, in sinus rhythm. Continue Coreg 6.25 mg bid. She has a history of beta blocker-induced bradycardia. Continue Eliquis 2.5 mg bid (meets reduced dosing criteria given age > 59 and weight < 60 kg). CHADS2VASc at least 5 (HTN, age x 2, vascular disease, female).  2. HTN: Blood pressure was elevated at triage. Patient and her daughter indicate they got lost on the way here. Recheck BP 139/82. Continue Coreg as above.   3. Carotid bruit: Schedule carotid ultrasound. Optimal BP and lipid control.  4. HLD: LDL of 78 in 08/2018. On pravastatin.   5. Anemia:  Recent check by PCP showed improved HGB of 10.7.   6. Abnormal TSH: Possibly in the setting of her acute illness. Recheck TSH by PCP post-hospital normal.    Disposition: F/u with Dr. Fletcher Anon or an APP in 3 months.   Current medicines are reviewed at length with the patient today.  The patient did not have any concerns regarding medicines.  Signed, Christell Faith, PA-C 10/11/2018 2:12 PM     [address] [place], [place] [id number] [phone]

## 2018-10-11 ENCOUNTER — Encounter

## 2018-10-11 ENCOUNTER — Ambulatory Visit (INDEPENDENT_AMBULATORY_CARE_PROVIDER_SITE_OTHER): Payer: Medicare HMO | Admitting: Physician Assistant

## 2018-10-11 ENCOUNTER — Encounter: Payer: Self-pay | Admitting: Physician Assistant

## 2018-10-11 VITALS — BP 160/80 | HR 71 | Ht 60.0 in | Wt 123.5 lb

## 2018-10-11 DIAGNOSIS — E782 Mixed hyperlipidemia: Secondary | ICD-10-CM | POA: Diagnosis not present

## 2018-10-11 DIAGNOSIS — D649 Anemia, unspecified: Secondary | ICD-10-CM

## 2018-10-11 DIAGNOSIS — R0989 Other specified symptoms and signs involving the circulatory and respiratory systems: Secondary | ICD-10-CM

## 2018-10-11 DIAGNOSIS — I480 Paroxysmal atrial fibrillation: Secondary | ICD-10-CM

## 2018-10-11 DIAGNOSIS — I10 Essential (primary) hypertension: Secondary | ICD-10-CM | POA: Diagnosis not present

## 2018-10-11 NOTE — Patient Instructions (Addendum)
Medication Instructions:  No changes  If you need a refill on your cardiac medications before your next appointment, please call your pharmacy.   Lab work: None ordered  Testing/Procedures: Your physician has requested that you have a carotid duplex. This test is an ultrasound of the carotid arteries in your neck. It looks at blood flow through these arteries that supply the brain with blood. Allow one hour for this exam. There are no restrictions or special instructions. This will take place at [id number], the [facility] office.   Follow-Up: At [facility], you and your health needs are our priority.  As part of our continuing mission to provide you with exceptional heart care, we have created designated Provider Care Teams.  These Care Teams include your primary Cardiologist (physician) and Advanced Practice Providers (APPs -  Physician Assistants and Nurse Practitioners) who all work together to provide you with the care you need, when you need it. You will need a follow up appointment in 3 months.  You may see Dr Fletcher Anon or one of the following Advanced Practice Providers on your designated Care Team:   Christell Faith, [place]

## 2018-10-18 ENCOUNTER — Ambulatory Visit (INDEPENDENT_AMBULATORY_CARE_PROVIDER_SITE_OTHER): Payer: Medicare HMO

## 2018-10-18 DIAGNOSIS — R0989 Other specified symptoms and signs involving the circulatory and respiratory systems: Secondary | ICD-10-CM

## 2018-10-22 ENCOUNTER — Other Ambulatory Visit: Payer: Self-pay | Admitting: *Deleted

## 2018-10-22 DIAGNOSIS — I6529 Occlusion and stenosis of unspecified carotid artery: Secondary | ICD-10-CM

## 2018-10-25 DIAGNOSIS — R69 Illness, unspecified: Secondary | ICD-10-CM | POA: Diagnosis not present

## 2018-11-01 DIAGNOSIS — J449 Chronic obstructive pulmonary disease, unspecified: Secondary | ICD-10-CM | POA: Diagnosis not present

## 2018-12-01 DIAGNOSIS — J449 Chronic obstructive pulmonary disease, unspecified: Secondary | ICD-10-CM | POA: Diagnosis not present

## 2018-12-07 DIAGNOSIS — R69 Illness, unspecified: Secondary | ICD-10-CM | POA: Diagnosis not present

## 2018-12-15 ENCOUNTER — Encounter: Payer: Self-pay | Admitting: Emergency Medicine

## 2018-12-15 ENCOUNTER — Other Ambulatory Visit: Payer: Self-pay

## 2018-12-15 ENCOUNTER — Emergency Department: Payer: Medicare HMO

## 2018-12-15 ENCOUNTER — Inpatient Hospital Stay
Admission: EM | Admit: 2018-12-15 | Discharge: 2018-12-21 | DRG: 374 | Disposition: A | Discharge status: Home / Self Care | Payer: Medicare HMO | Attending: Internal Medicine | Admitting: Internal Medicine

## 2018-12-15 DIAGNOSIS — Z7952 Long term (current) use of systemic steroids: Secondary | ICD-10-CM

## 2018-12-15 DIAGNOSIS — E876 Hypokalemia: Secondary | ICD-10-CM | POA: Diagnosis not present

## 2018-12-15 DIAGNOSIS — D490 Neoplasm of unspecified behavior of digestive system: Secondary | ICD-10-CM

## 2018-12-15 DIAGNOSIS — Z833 Family history of diabetes mellitus: Secondary | ICD-10-CM

## 2018-12-15 DIAGNOSIS — D120 Benign neoplasm of cecum: Secondary | ICD-10-CM

## 2018-12-15 DIAGNOSIS — Z8249 Family history of ischemic heart disease and other diseases of the circulatory system: Secondary | ICD-10-CM

## 2018-12-15 DIAGNOSIS — K6389 Other specified diseases of intestine: Secondary | ICD-10-CM | POA: Diagnosis not present

## 2018-12-15 DIAGNOSIS — K5730 Diverticulosis of large intestine without perforation or abscess without bleeding: Secondary | ICD-10-CM | POA: Diagnosis present

## 2018-12-15 DIAGNOSIS — Z7901 Long term (current) use of anticoagulants: Secondary | ICD-10-CM

## 2018-12-15 DIAGNOSIS — E871 Hypo-osmolality and hyponatremia: Secondary | ICD-10-CM | POA: Diagnosis present

## 2018-12-15 DIAGNOSIS — Z820 Family history of epilepsy and other diseases of the nervous system: Secondary | ICD-10-CM

## 2018-12-15 DIAGNOSIS — I340 Nonrheumatic mitral (valve) insufficiency: Secondary | ICD-10-CM | POA: Diagnosis present

## 2018-12-15 DIAGNOSIS — R0602 Shortness of breath: Secondary | ICD-10-CM | POA: Diagnosis not present

## 2018-12-15 DIAGNOSIS — Z9842 Cataract extraction status, left eye: Secondary | ICD-10-CM | POA: Diagnosis not present

## 2018-12-15 DIAGNOSIS — Z9841 Cataract extraction status, right eye: Secondary | ICD-10-CM | POA: Diagnosis not present

## 2018-12-15 DIAGNOSIS — J440 Chronic obstructive pulmonary disease with acute lower respiratory infection: Secondary | ICD-10-CM | POA: Diagnosis present

## 2018-12-15 DIAGNOSIS — K648 Other hemorrhoids: Secondary | ICD-10-CM | POA: Diagnosis present

## 2018-12-15 DIAGNOSIS — Z882 Allergy status to sulfonamides status: Secondary | ICD-10-CM

## 2018-12-15 DIAGNOSIS — K635 Polyp of colon: Secondary | ICD-10-CM | POA: Diagnosis present

## 2018-12-15 DIAGNOSIS — Z880 Allergy status to penicillin: Secondary | ICD-10-CM

## 2018-12-15 DIAGNOSIS — E785 Hyperlipidemia, unspecified: Secondary | ICD-10-CM | POA: Diagnosis present

## 2018-12-15 DIAGNOSIS — J441 Chronic obstructive pulmonary disease with (acute) exacerbation: Secondary | ICD-10-CM | POA: Diagnosis not present

## 2018-12-15 DIAGNOSIS — D125 Benign neoplasm of sigmoid colon: Secondary | ICD-10-CM

## 2018-12-15 DIAGNOSIS — C182 Malignant neoplasm of ascending colon: Secondary | ICD-10-CM | POA: Diagnosis not present

## 2018-12-15 DIAGNOSIS — D509 Iron deficiency anemia, unspecified: Secondary | ICD-10-CM | POA: Diagnosis not present

## 2018-12-15 DIAGNOSIS — I10 Essential (primary) hypertension: Secondary | ICD-10-CM | POA: Diagnosis not present

## 2018-12-15 DIAGNOSIS — J9611 Chronic respiratory failure with hypoxia: Secondary | ICD-10-CM | POA: Diagnosis present

## 2018-12-15 DIAGNOSIS — D374 Neoplasm of uncertain behavior of colon: Principal | ICD-10-CM | POA: Diagnosis present

## 2018-12-15 DIAGNOSIS — Z87891 Personal history of nicotine dependence: Secondary | ICD-10-CM | POA: Diagnosis not present

## 2018-12-15 DIAGNOSIS — Z90710 Acquired absence of both cervix and uterus: Secondary | ICD-10-CM | POA: Diagnosis not present

## 2018-12-15 DIAGNOSIS — K5791 Diverticulosis of intestine, part unspecified, without perforation or abscess with bleeding: Secondary | ICD-10-CM | POA: Diagnosis not present

## 2018-12-15 DIAGNOSIS — R7881 Bacteremia: Secondary | ICD-10-CM | POA: Diagnosis not present

## 2018-12-15 DIAGNOSIS — J449 Chronic obstructive pulmonary disease, unspecified: Secondary | ICD-10-CM | POA: Diagnosis not present

## 2018-12-15 DIAGNOSIS — K922 Gastrointestinal hemorrhage, unspecified: Secondary | ICD-10-CM | POA: Diagnosis not present

## 2018-12-15 DIAGNOSIS — D649 Anemia, unspecified: Secondary | ICD-10-CM | POA: Diagnosis not present

## 2018-12-15 DIAGNOSIS — Z961 Presence of intraocular lens: Secondary | ICD-10-CM | POA: Diagnosis present

## 2018-12-15 DIAGNOSIS — K921 Melena: Secondary | ICD-10-CM | POA: Diagnosis present

## 2018-12-15 DIAGNOSIS — Z79899 Other long term (current) drug therapy: Secondary | ICD-10-CM

## 2018-12-15 DIAGNOSIS — J181 Lobar pneumonia, unspecified organism: Secondary | ICD-10-CM

## 2018-12-15 DIAGNOSIS — Z9981 Dependence on supplemental oxygen: Secondary | ICD-10-CM | POA: Diagnosis not present

## 2018-12-15 DIAGNOSIS — J189 Pneumonia, unspecified organism: Secondary | ICD-10-CM | POA: Diagnosis present

## 2018-12-15 DIAGNOSIS — M810 Age-related osteoporosis without current pathological fracture: Secondary | ICD-10-CM | POA: Diagnosis present

## 2018-12-15 DIAGNOSIS — Z9049 Acquired absence of other specified parts of digestive tract: Secondary | ICD-10-CM | POA: Diagnosis not present

## 2018-12-15 DIAGNOSIS — K219 Gastro-esophageal reflux disease without esophagitis: Secondary | ICD-10-CM | POA: Diagnosis present

## 2018-12-15 DIAGNOSIS — I480 Paroxysmal atrial fibrillation: Secondary | ICD-10-CM | POA: Diagnosis present

## 2018-12-15 DIAGNOSIS — Z808 Family history of malignant neoplasm of other organs or systems: Secondary | ICD-10-CM

## 2018-12-15 DIAGNOSIS — Z7951 Long term (current) use of inhaled steroids: Secondary | ICD-10-CM

## 2018-12-15 LAB — BASIC METABOLIC PANEL
ANION GAP: 8 (ref 5–15)
BUN: 16 mg/dL (ref 8–23)
CALCIUM: 8.1 mg/dL — AB (ref 8.9–10.3)
CO2: 26 mmol/L (ref 22–32)
Chloride: 100 mmol/L (ref 98–111)
Creatinine, Ser: 0.78 mg/dL (ref 0.44–1.00)
GFR calc Af Amer: >60 mL/min (ref >60)
GLUCOSE: 112 mg/dL — AB (ref 70–99)
Potassium: 3.2 mmol/L — ABNORMAL LOW (ref 3.5–5.1)
SODIUM: 134 mmol/L — AB (ref 135–145)

## 2018-12-15 LAB — CBC WITH DIFFERENTIAL/PLATELET
Abs Immature Granulocytes: 0.03 10*3/uL (ref 0.00–0.07)
BASOS ABS: 0 10*3/uL (ref 0.0–0.1)
Basophils Relative: 0 %
EOS ABS: 0.1 10*3/uL (ref 0.0–0.5)
Eosinophils Relative: 2 %
HCT: 19 % — ABNORMAL LOW (ref 36.0–46.0)
Hemoglobin: 5.5 g/dL — ABNORMAL LOW (ref 12.0–15.0)
IMMATURE GRANULOCYTES: 0 %
Lymphocytes Relative: 22 %
Lymphs Abs: 1.5 10*3/uL (ref 0.7–4.0)
MCH: 25.3 pg — ABNORMAL LOW (ref 26.0–34.0)
MCHC: 28.9 g/dL — ABNORMAL LOW (ref 30.0–36.0)
MCV: 87.6 fL (ref 80.0–100.0)
Monocytes Absolute: 0.8 10*3/uL (ref 0.1–1.0)
Monocytes Relative: 11 %
NEUTROS ABS: 4.6 10*3/uL (ref 1.7–7.7)
NEUTROS PCT: 65 %
NRBC: 0 % (ref 0.0–0.2)
Platelets: 318 10*3/uL (ref 150–400)
RBC: 2.17 MIL/uL — ABNORMAL LOW (ref 3.87–5.11)
RDW: 13.3 % (ref 11.5–15.5)
WBC: 7.1 10*3/uL (ref 4.0–10.5)

## 2018-12-15 LAB — PROTIME-INR
INR: 1.49
Prothrombin Time: 17.8 seconds — ABNORMAL HIGH (ref 11.4–15.2)

## 2018-12-15 LAB — HEMOGLOBIN: Hemoglobin: 5.3 g/dL — ABNORMAL LOW (ref 12.0–15.0)

## 2018-12-15 LAB — CG4 I-STAT (LACTIC ACID): Lactic Acid, Venous: 0.52 mmol/L (ref 0.5–1.9)

## 2018-12-15 LAB — ABO/RH: ABO/RH(D): O POS

## 2018-12-15 LAB — APTT: APTT: 43 s — AB (ref 24–36)

## 2018-12-15 LAB — PREPARE RBC (CROSSMATCH)

## 2018-12-15 MED ORDER — POTASSIUM CHLORIDE CRYS ER 20 MEQ PO TBCR
40.0000 meq | EXTENDED_RELEASE_TABLET | Freq: Once | ORAL | Status: AC
  Administered 2018-12-15: 40 meq via ORAL
  Filled 2018-12-15: qty 2

## 2018-12-15 MED ORDER — PANTOPRAZOLE SODIUM 40 MG IV SOLR
40.0000 mg | Freq: Two times a day (BID) | INTRAVENOUS | Status: DC
  Administered 2018-12-19 – 2018-12-21 (×5): 40 mg via INTRAVENOUS
  Filled 2018-12-15 (×5): qty 40

## 2018-12-15 MED ORDER — LEVOFLOXACIN IN D5W 750 MG/150ML IV SOLN: 750.0000 mg | INTRAVENOUS | Status: DC

## 2018-12-15 MED ORDER — ONDANSETRON HCL 4 MG PO TABS: 4.0000 mg | ORAL_TABLET | Freq: Four times a day (QID) | ORAL | Status: DC | PRN

## 2018-12-15 MED ORDER — SODIUM CHLORIDE 0.9 % IV SOLN
8.0000 mg/h | INTRAVENOUS | Status: DC
  Administered 2018-12-16 (×2): 8 mg/h via INTRAVENOUS
  Filled 2018-12-15: qty 80

## 2018-12-15 MED ORDER — SODIUM CHLORIDE 0.9 % IV SOLN
80.0000 mg | Freq: Once | INTRAVENOUS | Status: AC
  Administered 2018-12-15: 80 mg via INTRAVENOUS
  Filled 2018-12-15: qty 80

## 2018-12-15 MED ORDER — METHYLPREDNISOLONE SODIUM SUCC 125 MG IJ SOLR
60.0000 mg | Freq: Four times a day (QID) | INTRAMUSCULAR | Status: DC
  Administered 2018-12-16 – 2018-12-18 (×10): 60 mg via INTRAVENOUS
  Filled 2018-12-15 (×10): qty 2

## 2018-12-15 MED ORDER — ACETAMINOPHEN 325 MG PO TABS: 650.0000 mg | ORAL_TABLET | Freq: Four times a day (QID) | ORAL | Status: DC | PRN

## 2018-12-15 MED ORDER — BUDESONIDE 0.25 MG/2ML IN SUSP
0.2500 mg | Freq: Two times a day (BID) | RESPIRATORY_TRACT | Status: DC
  Administered 2018-12-15 – 2018-12-20 (×10): 0.25 mg via RESPIRATORY_TRACT
  Filled 2018-12-15 (×11): qty 2

## 2018-12-15 MED ORDER — CARVEDILOL 6.25 MG PO TABS
6.2500 mg | ORAL_TABLET | Freq: Two times a day (BID) | ORAL | Status: DC
  Administered 2018-12-16 – 2018-12-21 (×11): 6.25 mg via ORAL
  Filled 2018-12-15 (×11): qty 1

## 2018-12-15 MED ORDER — ACETAMINOPHEN 650 MG RE SUPP: 650.0000 mg | Freq: Four times a day (QID) | RECTAL | Status: DC | PRN

## 2018-12-15 MED ORDER — OXYBUTYNIN CHLORIDE 5 MG PO TABS
5.0000 mg | ORAL_TABLET | Freq: Two times a day (BID) | ORAL | Status: DC
  Administered 2018-12-15 – 2018-12-21 (×12): 5 mg via ORAL
  Filled 2018-12-15 (×12): qty 1

## 2018-12-15 MED ORDER — SODIUM CHLORIDE 0.9 % IV SOLN
10.0000 mL/h | Freq: Once | INTRAVENOUS | Status: AC
  Administered 2018-12-16: 10 mL/h via INTRAVENOUS

## 2018-12-15 MED ORDER — VITAMIN D 25 MCG (1000 UNIT) PO TABS
1000.0000 [IU] | ORAL_TABLET | Freq: Every day | ORAL | Status: DC
  Administered 2018-12-16 – 2018-12-21 (×6): 1000 [IU] via ORAL
  Filled 2018-12-15 (×6): qty 1

## 2018-12-15 MED ORDER — ONDANSETRON HCL 4 MG/2ML IJ SOLN: 4.0000 mg | Freq: Four times a day (QID) | INTRAMUSCULAR | Status: DC | PRN

## 2018-12-15 MED ORDER — OMEGA-3-ACID ETHYL ESTERS 1 G PO CAPS
1.0000 g | ORAL_CAPSULE | Freq: Every day | ORAL | Status: DC
  Administered 2018-12-16 – 2018-12-21 (×6): 1 g via ORAL
  Filled 2018-12-15 (×6): qty 1

## 2018-12-15 MED ORDER — LEVOFLOXACIN IN D5W 750 MG/150ML IV SOLN
750.0000 mg | Freq: Once | INTRAVENOUS | Status: DC
  Filled 2018-12-15: qty 150

## 2018-12-15 MED ORDER — PRAVASTATIN SODIUM 20 MG PO TABS
40.0000 mg | ORAL_TABLET | Freq: Every day | ORAL | Status: DC
  Administered 2018-12-15 – 2018-12-20 (×6): 40 mg via ORAL
  Filled 2018-12-15: qty 2
  Filled 2018-12-15 (×2): qty 1
  Filled 2018-12-15: qty 2
  Filled 2018-12-15: qty 1
  Filled 2018-12-15 (×2): qty 2
  Filled 2018-12-15: qty 1
  Filled 2018-12-15: qty 2

## 2018-12-15 MED ORDER — IPRATROPIUM-ALBUTEROL 0.5-2.5 (3) MG/3ML IN SOLN
3.0000 mL | RESPIRATORY_TRACT | Status: DC
  Administered 2018-12-15 – 2018-12-16 (×3): 3 mL via RESPIRATORY_TRACT
  Filled 2018-12-15 (×3): qty 3

## 2018-12-15 MED ORDER — SODIUM CHLORIDE 0.9 % IV SOLN
INTRAVENOUS | Status: DC
  Administered 2018-12-16 – 2018-12-20 (×5): via INTRAVENOUS

## 2018-12-15 NOTE — ED Notes (Addendum)
istat completed by this Probation officer and MD notified of lactic 0.52

## 2018-12-15 NOTE — ED Triage Notes (Signed)
Pt to ED via POV c/o shortness of breath. Pt states that she is supposed to be on 2 Liters of O2 at home but she is not currently on any oxygen. Pt has audible wheezing in triage. Pt is tachypneic. Pt SpO2 is 94% on room air.

## 2018-12-15 NOTE — H&P (Signed)
Advanced care plan.  Purpose of the Encounter: CODE STATUS  Parties in Attendance: Patient herself Patient's Decision Capacity: Intact  Subjective/Patient's story: [name]  is a 84 y.o. female with a known history of chronic respiratory failure on oxygen mostly at nighttime, history of pneumonia, COPD, recent diagnosis of paroxysmal atrial fibrillation who was started on Eliquis who is brought to the hospital because she was having cough shortness of breath.  Also noticed to have a GI bleed   Objective/Medical story  Discussed with the patient regarding desires for cardiac and pulmonary resuscitation.    Goals of care determination: Patient states that she would like everything to be done and would like to be a full code    CODE STATUS: full code   Time spent discussing advanced care planning: 16 minutes

## 2018-12-15 NOTE — Progress Notes (Signed)
Pharmacy Antibiotic Note  [name] is a 84 y.o. female admitted on 12/15/2018 with pneumonia.  Pharmacy has been consulted for Levaquin dosing.  Plan: Levaquin 750mg  IV q48h  Height: 5\' 2"  (157.5 cm)(5'2") Weight: 127 lb 14.4 oz (58 kg) IBW/kg (Calculated) : 50.1  Temp (24hrs), Avg:99.2 F (37.3 C), Min:98.4 F (36.9 C), Max:100 F (37.8 C)  Recent Labs  Lab 12/15/18 1715 12/15/18 2036  WBC 7.1  --   CREATININE 0.78  --   LATICACIDVEN  --  0.52    Estimated Creatinine Clearance: 42.1 mL/min (by C-G formula based on SCr of 0.78 mg/dL).    Allergies  Allergen Reactions  . Penicillins Other (See Comments)    Patient unsure of what reaction was. Has patient had a PCN reaction causing immediate rash, facial/tongue/throat swelling, SOB or lightheadedness with hypotension: unknown Has patient had a PCN reaction causing severe rash involving mucus membranes or skin necrosis: uknown Has patient had a PCN reaction that required hospitalization: unknown Has patient had a PCN reaction occurring within the last 10 years: No If all of the above answers are "NO", then may proceed with Cephalosporin use.   Francine Graven Other (See Comments)    Unknown reaction. Last used long time ago. Quit taking,.    Antimicrobials this admission: Levaquin 12/28 >>   Thank you for allowing pharmacy to be a part of this patient's care.  Paulina Fusi, PharmD, BCPS 12/15/2018 10:55 PM

## 2018-12-15 NOTE — H&P (Signed)
[facility] NAME: [name]    MR#:  [id number]  DATE OF BIRTH:  [birth year]  DATE OF ADMISSION:  12/15/2018  PRIMARY CARE PHYSICIAN: [name], MD   REQUESTING/REFERRING PHYSICIAN: Lavonia Drafts, MD  CHIEF COMPLAINT:   Chief Complaint  Patient presents with  . Shortness of Breath    HISTORY OF PRESENT ILLNESS: [name]  is a 84 y.o. female with a known history of chronic respiratory failure on oxygen mostly at nighttime, history of pneumonia, COPD, recent diagnosis of paroxysmal atrial fibrillation who was started on Eliquis who is brought to the hospital because she was having cough shortness of breath.  Daughter also noticed that her legs were swollen.  Patient did not tell her daughter that she also was having dark tarry stools.  Patient in the ER was noted to be severely anemic.  She denies any chest pain or palpitations complains of cough shortness of breath then wheezing.  Also has had low-grade fevers. PAST MEDICAL HISTORY:   Past Medical History:  Diagnosis Date  . Bradycardia    a. beta blocker-induced  . Chronic hyponatremia   . Chronic respiratory failure with hypoxia (HCC)    a. secondary to COPD; b. on 2 L via [place] at baseline  . Clavicular fracture    History of   . COPD (chronic obstructive pulmonary disease) ([place])   . Gastroesophageal reflux disease   . Generalized and unspecified atherosclerosis   . HOH (hard of hearing)   . Hyperlipidemia   . Hypertension   . Mitral regurgitation    a. TTE 2015: LVSF nl, no RWMA, mild MR, trivial AI; b. TTE 9/19 EF 55-60%, no RWMA, mild AI, LA normal in size, RVSF normal, PASP 33  . Orthopnea   . Osteoporosis   . PAF (paroxysmal atrial fibrillation) ([place])    a. diagnosed 9/19: CHADS2VASc at least 5 (HTN, age x 2, vascular disease, female); b. Eliquis   . Tobacco abuse     PAST SURGICAL HISTORY:  Past Surgical History:  Procedure Laterality Date  .  ABDOMINAL HYSTERECTOMY    . APPENDECTOMY  20+ years ago  . BACK SURGERY  2 years ago   x2  . Cardiac Event Monitor  February 2015   Sinus rhythm sinus bradycardia rates of 46-80 beats a minute. No other arrhythmias; occasional PACs with some being blocked., Occasional first-degree AV block.  Marland Kitchen CATARACT EXTRACTION W/PHACO Left 01/19/2017   Procedure: CATARACT EXTRACTION PHACO AND INTRAOCULAR LENS PLACEMENT (IOC);  Surgeon: Eulogio Bear, MD;  Location: [facility] ORS;  Service: Ophthalmology;  Laterality: Left;  us00:58.3 ap%14.8 cde8.63 lot # [id number]  . CATARACT EXTRACTION W/PHACO Right 02/16/2017   Procedure: CATARACT EXTRACTION PHACO AND INTRAOCULAR LENS PLACEMENT (IOC);  Surgeon: Eulogio Bear, MD;  Location: [facility] ORS;  Service: Ophthalmology;  Laterality: Right;  [place] 1:10.9 AP% 11.0 CDE 7.84 FLUID PACK LOT # [id number]  . TRANSTHORACIC ECHOCARDIOGRAM  01/21/2014   Normal LV size and function. No regional wall motion abnormalities. Mild aortic sclerosis with trivial regurgitation.  . VESICOVAGINAL FISTULA CLOSURE W/ TAH  20+ years ago    SOCIAL HISTORY:  Social History   Tobacco Use  . Smoking status: Former Smoker    Packs/day: 1.50    Years: 50.00    Pack years: 75.00    Last attempt to quit: 02/16/2018    Years since quitting: 0.8  . Smokeless tobacco: Never Used  Substance Use Topics  .  Alcohol use: No    FAMILY HISTORY:  Family History  Problem Relation Age of Onset  . Diabetes Mother   . Heart disease Sister   . Heart attack Sister   . Brain cancer Brother   . ALS Daughter     DRUG ALLERGIES:  Allergies  Allergen Reactions  . Penicillins Other (See Comments)    Patient unsure of what reaction was. Has patient had a PCN reaction causing immediate rash, facial/tongue/throat swelling, SOB or lightheadedness with hypotension: unknown Has patient had a PCN reaction causing severe rash involving mucus membranes or skin necrosis: uknown Has patient had a PCN reaction  that required hospitalization: unknown Has patient had a PCN reaction occurring within the last 10 years: No If all of the above answers are "NO", then may proceed with Cephalosporin use.   Francine Graven Other (See Comments)    Unknown reaction. Last used long time ago. Quit taking,.    REVIEW OF SYSTEMS:   CONSTITUTIONAL: No fever, fatigue or weakness.  EYES: No blurred or double vision.  EARS, NOSE, AND THROAT: No tinnitus or ear pain.  RESPIRATORY: No cough, positive shortness of breath, positive wheezing or hemoptysis.  CARDIOVASCULAR: No chest pain, orthopnea, positive edema.  GASTROINTESTINAL: No nausea, vomiting, diarrhea or abdominal pain.  GENITOURINARY: No dysuria, hematuria.  ENDOCRINE: No polyuria, nocturia,  HEMATOLOGY: No anemia, easy bruising or bleeding SKIN: No rash or lesion. MUSCULOSKELETAL: No joint pain or arthritis.   NEUROLOGIC: No tingling, numbness, weakness.  PSYCHIATRY: No anxiety or depression.   MEDICATIONS AT HOME:  Prior to Admission medications   Medication Sig Start Date End Date Taking? Authorizing Provider  apixaban (ELIQUIS) 2.5 MG TABS tablet Take 1 tablet (2.5 mg total) by mouth 2 (two) times daily. 09/07/18   Salary, Avel Peace, MD  budesonide-formoterol (SYMBICORT) 160-4.5 MCG/ACT inhaler Inhale 2 puffs into the lungs 2 (two) times daily.    [provider]  carvedilol (COREG) 6.25 MG tablet Take 6.25 mg by mouth 2 (two) times daily.     [provider]  cholecalciferol (VITAMIN D) 1000 units tablet Take 1,000 Units by mouth daily.    [provider]  ipratropium (ATROVENT) 0.02 % nebulizer solution Take 2.5 mLs (0.5 mg total) by nebulization 3 (three) times daily. 09/07/18   Salary, Avel Peace, MD  levalbuterol (XOPENEX) 0.63 MG/3ML nebulizer solution Take 3 mLs (0.63 mg total) by nebulization 3 (three) times daily. 09/07/18   Salary, Avel Peace, MD  Omega-3 Fatty Acids (FISH OIL) 1000 MG CAPS Take 1 capsule by mouth  daily.    [provider]  oxybutynin (DITROPAN) 5 MG tablet Take 5 mg by mouth 2 (two) times daily. 12/26/16   [provider]  pantoprazole (PROTONIX) 40 MG tablet Take 1 tablet (40 mg total) by mouth daily. 09/08/18   Salary, Avel Peace, MD  pravastatin (PRAVACHOL) 40 MG tablet Take 40 mg by mouth daily.    [provider]  predniSONE (STERAPRED UNI-PAK 21 TAB) 10 MG (21) TBPK tablet Take as directed 09/07/18   Salary, Holly Bodily D, MD      PHYSICAL EXAMINATION:   VITAL SIGNS: Blood pressure (!) 148/61, pulse 80, temperature 100 F (37.8 C), temperature source Oral, resp. rate 16, SpO2 94 %.  GENERAL:  84 y.o.-year-old patient lying in the bed with no acute distress.  EYES: Pupils equal, round, reactive to light and accommodation. No scleral icterus. Extraocular muscles intact.  HEENT: Head atraumatic, normocephalic. Oropharynx and nasopharynx clear.  NECK:  Supple, no jugular venous distention. No thyroid enlargement, no tenderness.  LUNGS: Bilateral wheezing throughout both lungs no use of accessory muscles of respiration.  CARDIOVASCULAR: S1, S2 normal. No murmurs, rubs, or gallops.  ABDOMEN: Soft, nontender, nondistended. Bowel sounds present. No organomegaly or mass.  EXTREMITIES: 2+ pedal edema, cyanosis, or clubbing.  NEUROLOGIC: Cranial nerves II through XII are intact. Muscle strength 5/5 in all extremities. Sensation intact. Gait not checked.  PSYCHIATRIC: The patient is alert and oriented x 3.  SKIN: No obvious rash, lesion, or ulcer.   LABORATORY PANEL:   CBC Recent Labs  Lab 12/15/18 1715  WBC 7.1  HGB 5.5*  HCT 19.0*  PLT 318  MCV 87.6  MCH 25.3*  MCHC 28.9*  RDW 13.3  LYMPHSABS 1.5  MONOABS 0.8  EOSABS 0.1  BASOSABS 0.0   ------------------------------------------------------------------------------------------------------------------  Chemistries  Recent Labs  Lab 12/15/18 1715  NA 134*  K 3.2*  CL 100  CO2 26  GLUCOSE 112*   BUN 16  CREATININE 0.78  CALCIUM 8.1*   ------------------------------------------------------------------------------------------------------------------ CrCl cannot be calculated (Unknown ideal weight.). ------------------------------------------------------------------------------------------------------------------ No results for input(s): TSH, T4TOTAL, T3FREE, THYROIDAB in the last 72 hours.  Invalid input(s): FREET3   Coagulation profile No results for input(s): INR, PROTIME in the last 168 hours. ------------------------------------------------------------------------------------------------------------------- No results for input(s): DDIMER in the last 72 hours. -------------------------------------------------------------------------------------------------------------------  Cardiac Enzymes No results for input(s): CKMB, TROPONINI, MYOGLOBIN in the last 168 hours.  Invalid input(s): CK ------------------------------------------------------------------------------------------------------------------ Invalid input(s): POCBNP  ---------------------------------------------------------------------------------------------------------------  Urinalysis    Component Value Date/Time   COLORURINE YELLOW (A) 01/24/2017 0427   APPEARANCEUR HAZY (A) 01/24/2017 0427   LABSPEC 1.009 01/24/2017 0427   PHURINE 7.0 01/24/2017 0427   GLUCOSEU NEGATIVE 01/24/2017 0427   HGBUR MODERATE (A) 01/24/2017 0427   BILIRUBINUR NEGATIVE 01/24/2017 0427   KETONESUR NEGATIVE 01/24/2017 0427   PROTEINUR NEGATIVE 01/24/2017 0427   NITRITE NEGATIVE 01/24/2017 0427   LEUKOCYTESUR TRACE (A) 01/24/2017 0427     RADIOLOGY: Dg Chest 2 View  Result Date: 12/15/2018 CLINICAL DATA:  COPD with increasing dyspnea for the past few days. EXAM: CHEST - 2 VIEW COMPARISON:  09/05/2018 FINDINGS: Emphysematous hyperinflation of the lungs. Normal heart size. Tortuous atherosclerotic aorta without aneurysm.  Partial clearing of airspace opacities in the right upper lobe. Scarring is suggested at the left lung apex unchanged. No dominant mass or pulmonary consolidations. Subsegmental atelectasis is seen at the left lung base. No acute nor suspicious osseous abnormalities. IMPRESSION: Emphysematous hyperinflation of the lungs. Partial clearing of right upper lobe pneumonia. No new pulmonary consolidation or CHF. Electronically Signed   By: Ashley Royalty M.D.   On: 12/15/2018 18:50    EKG: Orders placed or performed during the hospital encounter of 12/15/18  . ED EKG  . ED EKG  . EKG 12-Lead  . EKG 12-Lead    IMPRESSION AND PLAN: Patient is a 84 year old white female with COPD presenting with shortness of breath cough noted to have severe anemia  1.  Upper GI bleed will stop Eliquis Place patient on Protonix drip  she will be transfused 2 units of packed RBCs I have notified Dr.Wohl the admission via [place] next  2.  Acute on chronic COPD exacerbation I will treat with nebulizer therapy IV Solu-Medrol Pulmicort nebs Check the flu test  3.  Pneumonia we will treat with IV Levaquin  4.  Paroxysmal atrial fibrillation Eliquis will be discontinued for now Continue Coreg  5.  Hyperlipidemia continue Pravachol  6. misc  scd's for dvt proph   All the records are reviewed and case discussed with ED provider. Management plans discussed with the patient, family and they are in agreement.  CODE STATUS: Code Status History    Date Active Date Inactive Code Status Order ID Comments User Context   09/05/2018 1813 09/07/2018 1736 Full Code [id number]  Epifanio Lesches, MD ED   01/24/2017 0857 01/26/2017 1658 Full Code [id number]  Theodoro Grist, MD Inpatient    Advance Directive Documentation     Most Recent Value  Type of Advance Directive  Healthcare Power of Attorney  Pre-existing out of facility DNR order (yellow form or pink MOST form)  -  "MOST" Form in Place?  -       TOTAL TIME TAKING  CARE OF THIS PATIENT: 55 minutes.    Dustin Flock M.D on 12/15/2018 at 8:44 PM  Between 7am to 6pm - Pager - [phone]  After 6pm go to [email or web address] - password [email or web address] [facility]  [phone]  CC: Primary care physician; [name], MD

## 2018-12-15 NOTE — ED Provider Notes (Signed)
[facility] Emergency Department Provider Note   ____________________________________________    I have reviewed the triage vital signs and the nursing notes.   HISTORY  Chief Complaint Shortness of Breath     HPI [name] is a 84 y.o. female who presents with complaints of shortness of breath.  Patient has a history of COPD.  Was admitted for pneumonia several months ago, recently has developed worsening cough and difficulty breathing.  She also complains of fatigue, mild dizziness.  I asked her how her stools are and she said they are black.  Patient is on Eliquis for atrial fibrillation, started during her last hospitalization.  No recent travel.  Some lower extremity swelling which is new over the last 2 days.  Past Medical History:  Diagnosis Date  . Bradycardia    a. beta blocker-induced  . Chronic hyponatremia   . Chronic respiratory failure with hypoxia (HCC)    a. secondary to COPD; b. on 2 L via [place] at baseline  . Clavicular fracture    History of   . COPD (chronic obstructive pulmonary disease) ([place])   . Gastroesophageal reflux disease   . Generalized and unspecified atherosclerosis   . HOH (hard of hearing)   . Hyperlipidemia   . Hypertension   . Mitral regurgitation    a. TTE 2015: LVSF nl, no RWMA, mild MR, trivial AI; b. TTE 9/19 EF 55-60%, no RWMA, mild AI, LA normal in size, RVSF normal, PASP 33  . Orthopnea   . Osteoporosis   . PAF (paroxysmal atrial fibrillation) ([place])    a. diagnosed 9/19: CHADS2VASc at least 5 (HTN, age x 2, vascular disease, female); b. Eliquis   . Tobacco abuse     Patient Active Problem List   Diagnosis Date Noted  . COPD exacerbation ([place]) 09/05/2018  . Hypomagnesemia 01/25/2017  . Hyponatremia 01/24/2017  . Hypokalemia 01/24/2017  . UTI (urinary tract infection) 01/24/2017  . Confusion 01/24/2017  . Hypoxia 01/24/2017  . Bruit of right carotid artery; normal dopplers 08/08/2014  .  Tobacco abuse   . Abnormal EKG 01/17/2014  . DOE (dyspnea on exertion) 01/17/2014  . Bradycardia 01/17/2014  . Essential hypertension   . Hyperlipidemia with target LDL less than 130     Past Surgical History:  Procedure Laterality Date  . ABDOMINAL HYSTERECTOMY    . APPENDECTOMY  20+ years ago  . BACK SURGERY  2 years ago   x2  . Cardiac Event Monitor  February 2015   Sinus rhythm sinus bradycardia rates of 46-80 beats a minute. No other arrhythmias; occasional PACs with some being blocked., Occasional first-degree AV block.  Marland Kitchen CATARACT EXTRACTION W/PHACO Left 01/19/2017   Procedure: CATARACT EXTRACTION PHACO AND INTRAOCULAR LENS PLACEMENT (IOC);  Surgeon: Eulogio Bear, MD;  Location: [facility] ORS;  Service: Ophthalmology;  Laterality: Left;  us00:58.3 ap%14.8 cde8.63 lot # [id number] H  . CATARACT EXTRACTION W/PHACO Right 02/16/2017   Procedure: CATARACT EXTRACTION PHACO AND INTRAOCULAR LENS PLACEMENT (IOC);  Surgeon: Eulogio Bear, MD;  Location: [facility] ORS;  Service: Ophthalmology;  Laterality: Right;  [place] 1:10.9 AP% 11.0 CDE 7.84 FLUID PACK LOT # [id number]  . TRANSTHORACIC ECHOCARDIOGRAM  01/21/2014   Normal LV size and function. No regional wall motion abnormalities. Mild aortic sclerosis with trivial regurgitation.  . VESICOVAGINAL FISTULA CLOSURE W/ TAH  20+ years ago    Prior to Admission medications   Medication Sig Start Date End Date Taking? Authorizing Provider  apixaban Arne [place])  2.5 MG TABS tablet Take 1 tablet (2.5 mg total) by mouth 2 (two) times daily. 09/07/18   Salary, Avel Peace, MD  budesonide-formoterol (SYMBICORT) 160-4.5 MCG/ACT inhaler Inhale 2 puffs into the lungs 2 (two) times daily.    [provider]  carvedilol (COREG) 6.25 MG tablet Take 6.25 mg by mouth 2 (two) times daily.     [provider]  cholecalciferol (VITAMIN D) 1000 units tablet Take 1,000 Units by mouth daily.    [provider]  ipratropium (ATROVENT) 0.02 %  nebulizer solution Take 2.5 mLs (0.5 mg total) by nebulization 3 (three) times daily. 09/07/18   Salary, Avel Peace, MD  levalbuterol (XOPENEX) 0.63 MG/3ML nebulizer solution Take 3 mLs (0.63 mg total) by nebulization 3 (three) times daily. 09/07/18   Salary, Avel Peace, MD  Omega-3 Fatty Acids (FISH OIL) 1000 MG CAPS Take 1 capsule by mouth daily.    [provider]  oxybutynin (DITROPAN) 5 MG tablet Take 5 mg by mouth 2 (two) times daily. 12/26/16   [provider]  pantoprazole (PROTONIX) 40 MG tablet Take 1 tablet (40 mg total) by mouth daily. 09/08/18   Salary, Avel Peace, MD  pravastatin (PRAVACHOL) 40 MG tablet Take 40 mg by mouth daily.    [provider]  predniSONE (STERAPRED UNI-PAK 21 TAB) 10 MG (21) TBPK tablet Take as directed 09/07/18   Salary, Avel Peace, MD     Allergies Penicillins and Sulfamethizole  Family History  Problem Relation Age of Onset  . Diabetes Mother   . Heart disease Sister   . Heart attack Sister   . Brain cancer Brother   . ALS Daughter     Social History Social History   Tobacco Use  . Smoking status: Former Smoker    Packs/day: 1.50    Years: 50.00    Pack years: 75.00    Last attempt to quit: 02/16/2018    Years since quitting: 0.8  . Smokeless tobacco: Never Used  Substance Use Topics  . Alcohol use: No  . Drug use: No    Review of Systems  Constitutional: No fever/chills Eyes: No visual changes.  ENT: No sore throat. Cardiovascular: Denies chest pain. Respiratory: As above Gastrointestinal: No abdominal pain.  Black stool  Genitourinary: Negative for dysuria. Musculoskeletal: Negative for back pain. Skin: Negative for rash. Neurological: Negative for headaches   ____________________________________________   PHYSICAL EXAM:  VITAL SIGNS: ED Triage Vitals  Enc Vitals Group     BP 12/15/18 1710 (!) 148/61     Pulse Rate 12/15/18 1710 80     Resp 12/15/18 1710 16     Temp 12/15/18 1710 100 F (37.8 C)       Temp Source 12/15/18 1710 Oral     SpO2 12/15/18 1710 94 %     Weight --      Height --      Head Circumference --      Peak Flow --      Pain Score 12/15/18 1711 0     Pain Loc --      Pain Edu? --      Excl. in [place]? --     Constitutional: Alert and oriented.  Eyes: Conjunctivae are normal.   Nose: No congestion/rhinnorhea. Mouth/Throat: Mucous membranes are moist.    Cardiovascular: Normal rate, regular rhythm.  Good peripheral circulation. Respiratory: Normal respiratory effort.  No retractions.  Scattered wheezes, [place] upper lungs bilaterally Gastrointestinal: Soft and nontender. No distention.  Musculoskeletal: Mild edema to the feet bilaterally warm and well perfused Neurologic:  Normal speech and language. No gross focal neurologic deficits are appreciated.  Skin:  Skin is warm, dry and intact. No rash noted. Psychiatric: Mood and affect are normal. Speech and behavior are normal.  ____________________________________________   LABS (all labs ordered are listed, but only abnormal results are displayed)  Labs Reviewed  CBC WITH DIFFERENTIAL/PLATELET - Abnormal; Notable for the following components:      Result Value   RBC 2.17 (*)    Hemoglobin 5.5 (*)    HCT 19.0 (*)    MCH 25.3 (*)    MCHC 28.9 (*)    All other components within normal limits  BASIC METABOLIC PANEL - Abnormal; Notable for the following components:   Sodium 134 (*)    Potassium 3.2 (*)    Glucose, Bld 112 (*)    Calcium 8.1 (*)    All other components within normal limits  CULTURE, BLOOD (ROUTINE X 2)  CULTURE, BLOOD (ROUTINE X 2)  APTT  PROTIME-INR  I-STAT CG4 LACTIC ACID, ED  TYPE AND SCREEN  PREPARE RBC (CROSSMATCH)   ____________________________________________  EKG  ED ECG REPORT I, Lavonia Drafts, the attending physician, personally viewed and interpreted this ECG.  Date: 12/15/2018  Rhythm: Atrial fibrillation QRS Axis: normal Intervals: normal ST/T Wave  abnormalities: normal Narrative Interpretation: no evidence of acute ischemia  ____________________________________________  RADIOLOGY  Chest x-ray with only partial clearing of right upper lobe pneumonia ____________________________________________   PROCEDURES  Procedure(s) performed: No  Procedures   Critical Care performed: yes  CRITICAL CARE Performed by: Lavonia Drafts   Total critical care time: 82minutes  Critical care time was exclusive of separately billable procedures and treating other patients.  Critical care was necessary to treat or prevent imminent or life-threatening deterioration.  Critical care was time spent personally by me on the following activities: development of treatment plan with patient and/or surrogate as well as nursing, discussions with consultants, evaluation of patient's response to treatment, examination of patient, obtaining history from patient or surrogate, ordering and performing treatments and interventions, ordering and review of laboratory studies, ordering and review of radiographic studies, pulse oximetry and re-evaluation of patient's condition.  ____________________________________________   INITIAL IMPRESSION / ASSESSMENT AND PLAN / ED COURSE  Pertinent labs & imaging results that were available during my care of the patient were reviewed by me and considered in my medical decision making (see chart for details).  Patient presents with weakness, cough, shortness of breath.  She has a history of COPD.  Productive strong cough noted, temperature is elevated.  Only partial clearing on chest x-ray of pneumonia diagnosed several months ago, suspect new pneumonia.  Also concerning is patient's hemoglobin of 5.5 and what sounds like melanotic stools.  I have consented the patient for transfusion we will treat her with antibiotics as well for presumed pneumonia.  She will require hospitalization      ____________________________________________   FINAL CLINICAL IMPRESSION(S) / ED DIAGNOSES  Final diagnoses:  SOB (shortness of breath)  Community acquired pneumonia of right upper lobe of lung ([place])  Gastrointestinal hemorrhage, unspecified gastrointestinal hemorrhage type        Note:  This document was prepared using Dragon voice recognition software and may include unintentional dictation errors.   Lavonia Drafts, MD 12/15/18 2034

## 2018-12-16 DIAGNOSIS — K922 Gastrointestinal hemorrhage, unspecified: Secondary | ICD-10-CM

## 2018-12-16 LAB — CBC
HCT: 26.7 % — ABNORMAL LOW (ref 36.0–46.0)
Hemoglobin: 8.5 g/dL — ABNORMAL LOW (ref 12.0–15.0)
MCH: 26.7 pg (ref 26.0–34.0)
MCHC: 31.8 g/dL (ref 30.0–36.0)
MCV: 84 fL (ref 80.0–100.0)
Platelets: 305 10*3/uL (ref 150–400)
RBC: 3.18 MIL/uL — ABNORMAL LOW (ref 3.87–5.11)
RDW: 14.2 % (ref 11.5–15.5)
WBC: 7.4 10*3/uL (ref 4.0–10.5)
nRBC: 0 % (ref 0.0–0.2)

## 2018-12-16 LAB — BLOOD CULTURE ID PANEL (REFLEXED)
Acinetobacter baumannii: NOT DETECTED
CANDIDA PARAPSILOSIS: NOT DETECTED
Candida albicans: NOT DETECTED
Candida glabrata: NOT DETECTED
Candida krusei: NOT DETECTED
Candida tropicalis: NOT DETECTED
Enterobacter cloacae complex: NOT DETECTED
Enterobacteriaceae species: NOT DETECTED
Enterococcus species: NOT DETECTED
Escherichia coli: NOT DETECTED
Haemophilus influenzae: NOT DETECTED
KLEBSIELLA OXYTOCA: NOT DETECTED
Klebsiella pneumoniae: NOT DETECTED
Listeria monocytogenes: NOT DETECTED
Neisseria meningitidis: NOT DETECTED
PROTEUS SPECIES: NOT DETECTED
Pseudomonas aeruginosa: NOT DETECTED
Serratia marcescens: NOT DETECTED
Staphylococcus aureus (BCID): NOT DETECTED
Staphylococcus species: NOT DETECTED
Streptococcus agalactiae: NOT DETECTED
Streptococcus pneumoniae: NOT DETECTED
Streptococcus pyogenes: NOT DETECTED
Streptococcus species: DETECTED — AB

## 2018-12-16 LAB — BASIC METABOLIC PANEL
Anion gap: 8 (ref 5–15)
BUN: 12 mg/dL (ref 8–23)
CALCIUM: 8.1 mg/dL — AB (ref 8.9–10.3)
CO2: 25 mmol/L (ref 22–32)
CREATININE: 0.62 mg/dL (ref 0.44–1.00)
Chloride: 102 mmol/L (ref 98–111)
GFR calc Af Amer: >60 mL/min (ref >60)
GFR calc non Af Amer: >60 mL/min (ref >60)
Glucose, Bld: 149 mg/dL — ABNORMAL HIGH (ref 70–99)
Potassium: 3.7 mmol/L (ref 3.5–5.1)
Sodium: 135 mmol/L (ref 135–145)

## 2018-12-16 LAB — HEMOGLOBIN
Hemoglobin: 8.4 g/dL — ABNORMAL LOW (ref 12.0–15.0)
Hemoglobin: 8.6 g/dL — ABNORMAL LOW (ref 12.0–15.0)

## 2018-12-16 LAB — INFLUENZA PANEL BY PCR (TYPE A & B)
Influenza A By PCR: NEGATIVE
Influenza B By PCR: NEGATIVE

## 2018-12-16 MED ORDER — GUAIFENESIN-DM 100-10 MG/5ML PO SYRP
5.0000 mL | ORAL_SOLUTION | ORAL | Status: DC | PRN
  Administered 2018-12-16 – 2018-12-19 (×8): 5 mL via ORAL
  Filled 2018-12-16 (×8): qty 5

## 2018-12-16 MED ORDER — SODIUM CHLORIDE 0.9 % IV SOLN
2.0000 g | INTRAVENOUS | Status: AC
  Administered 2018-12-16 – 2018-12-19 (×4): 2 g via INTRAVENOUS
  Filled 2018-12-16 (×2): qty 2
  Filled 2018-12-16: qty 20
  Filled 2018-12-16 (×2): qty 2
  Filled 2018-12-16: qty 20

## 2018-12-16 MED ORDER — GUAIFENESIN ER 600 MG PO TB12
600.0000 mg | ORAL_TABLET | Freq: Two times a day (BID) | ORAL | Status: DC
  Administered 2018-12-16 – 2018-12-21 (×10): 600 mg via ORAL
  Filled 2018-12-16 (×10): qty 1

## 2018-12-16 MED ORDER — IPRATROPIUM-ALBUTEROL 0.5-2.5 (3) MG/3ML IN SOLN
3.0000 mL | Freq: Four times a day (QID) | RESPIRATORY_TRACT | Status: DC
  Administered 2018-12-16 – 2018-12-18 (×7): 3 mL via RESPIRATORY_TRACT
  Filled 2018-12-16 (×8): qty 3

## 2018-12-16 NOTE — Progress Notes (Addendum)
[facility] NAME: [name]    MR#:  [id number]  DATE OF BIRTH:  03-May-1933  SUBJECTIVE:  CHIEF COMPLAINT:   Chief Complaint  Patient presents with  . Shortness of Breath  Patient seen today Has shortness of breath On oxygen via nasal cannula No new episodes of any melena or vomiting of blood Status post gastroenterology evaluation Endoscopy and colonoscopy deferred Advised to hold the Eliquis for now Monitor hemoglobin hematocrit  REVIEW OF SYSTEMS:    ROS  CONSTITUTIONAL: No documented fever. Has fatigue, weakness. No weight gain, no weight loss.  EYES: No blurry or double vision.  ENT: No tinnitus. No postnasal drip. No redness of the oropharynx.  RESPIRATORY: occasional cough, no wheeze, no hemoptysis. Has dyspnea.  CARDIOVASCULAR: No chest pain. No orthopnea. No palpitations. No syncope.  GASTROINTESTINAL: No nausea, no vomiting or diarrhea. No abdominal pain. No melena or hematochezia.  GENITOURINARY: No dysuria or hematuria.  ENDOCRINE: No polyuria or nocturia. No heat or cold intolerance.  HEMATOLOGY: No anemia. No bruising. No bleeding.  INTEGUMENTARY: No rashes. No lesions.  MUSCULOSKELETAL: No arthritis. No swelling. No gout.  NEUROLOGIC: No numbness, tingling, or ataxia. No seizure-type activity.  PSYCHIATRIC: No anxiety. No insomnia. No ADD.   DRUG ALLERGIES:   Allergies  Allergen Reactions  . Penicillins Other (See Comments)    Patient unsure of what reaction was. Has patient had a PCN reaction causing immediate rash, facial/tongue/throat swelling, SOB or lightheadedness with hypotension: unknown Has patient had a PCN reaction causing severe rash involving mucus membranes or skin necrosis: uknown Has patient had a PCN reaction that required hospitalization: unknown Has patient had a PCN reaction occurring within the last 10 years: No If all of the above answers are "NO", then may proceed with  Cephalosporin use.   Francine Graven Other (See Comments)    Unknown reaction. Last used long time ago. Quit taking,.    VITALS:  Blood pressure (!) 124/53, pulse 78, temperature 99.2 F (37.3 C), temperature source Oral, resp. rate 20, height 5\' 2"  (1.575 m), weight 58 kg, SpO2 100 %.  PHYSICAL EXAMINATION:   Physical Exam  GENERAL:  84 y.o.-year-old patient lying in the bed with no acute distress.  EYES: Pupils equal, round, reactive to light and accommodation. No scleral icterus. Extraocular muscles intact.  HEENT: Head atraumatic, normocephalic. Oropharynx and nasopharynx clear.  NECK:  Supple, no jugular venous distention. No thyroid enlargement, no tenderness.  LUNGS: Decreased breath sounds bilaterally, scattered rales in both lungs. No use of accessory muscles of respiration.  CARDIOVASCULAR: S1, S2 normal. No murmurs, rubs, or gallops.  ABDOMEN: Soft, nontender, nondistended. Bowel sounds present. No organomegaly or mass.  EXTREMITIES: No cyanosis, mild edema b/l.    NEUROLOGIC: Cranial nerves II through XII are intact. No focal Motor or sensory deficits b/l.   PSYCHIATRIC: The patient is alert and oriented x 3.  SKIN: No obvious rash, lesion, or ulcer.   LABORATORY PANEL:   CBC Recent Labs  Lab 12/16/18 0649  WBC 7.4  HGB 8.5*  HCT 26.7*  PLT 305   ------------------------------------------------------------------------------------------------------------------ Chemistries  Recent Labs  Lab 12/16/18 0649  NA 135  K 3.7  CL 102  CO2 25  GLUCOSE 149*  BUN 12  CREATININE 0.62  CALCIUM 8.1*   ------------------------------------------------------------------------------------------------------------------  Cardiac Enzymes No results for input(s): TROPONINI in the last 168 hours. ------------------------------------------------------------------------------------------------------------------  RADIOLOGY:  Dg Chest 2 View  Result Date:  12/15/2018 CLINICAL  DATA:  COPD with increasing dyspnea for the past few days. EXAM: CHEST - 2 VIEW COMPARISON:  09/05/2018 FINDINGS: Emphysematous hyperinflation of the lungs. Normal heart size. Tortuous atherosclerotic aorta without aneurysm. Partial clearing of airspace opacities in the right upper lobe. Scarring is suggested at the left lung apex unchanged. No dominant mass or pulmonary consolidations. Subsegmental atelectasis is seen at the left lung base. No acute nor suspicious osseous abnormalities. IMPRESSION: Emphysematous hyperinflation of the lungs. Partial clearing of right upper lobe pneumonia. No new pulmonary consolidation or CHF. Electronically Signed   By: Ashley Royalty M.D.   On: 12/15/2018 18:50     ASSESSMENT AND PLAN:   84 year old female patient with history of COPD, proximal atrial fibrillation, hyperlipidemia, hypertension, osteoporosis currently under hospitalist service for shortness of breath and GI bleed  -Acute on chronic COPD exacerbation Continue IV Solu-Medrol and nebulization treatments Is on via nasal cannula Flu test negative  -Gastrointestinal bleeding Status post evaluation by GI consultant S/p prbc transfusion Advised no endoscopy or colonoscopy in view of poor pulmonary status Hold Eliquis Monitor hemoglobin and hematocrit Resume diet  -Proximal atrial fibrillation Hold Eliquis in view of GI bleed Continue rate control  -Pneumonia Continue Levaquin antibiotic intravenously  -DVT prophylaxis sequential compression device to lower extremities  All the records are reviewed and case discussed with Care Management/Social Worker. Management plans discussed with the patient, family and they are in agreement.  CODE STATUS:Full code  DVT Prophylaxis: SCDs  TOTAL TIME TAKING CARE OF THIS PATIENT: 35 minutes.   POSSIBLE D/C IN 2 to 3 DAYS, DEPENDING ON CLINICAL CONDITION.  Saundra Shelling M.D on 12/16/2018 at 1:11 PM  Between 7am to 6pm - Pager -  [phone]  After 6pm go to [email or web address] - password [email or web address]  Office  [phone]  CC: Primary care physician; [name], MD  Note: This dictation was prepared with Dragon dictation along with smaller phrase technology. Any transcriptional errors that result from this process are unintentional.

## 2018-12-16 NOTE — Plan of Care (Signed)
12/16/2018 4:43 PM  Patient continues to have a productive cough. Incentive spirometer and TCDB with teach back given to patient. Implemented oral suctioning when necessary and give robitussin to help reduce cough. Also encouraged increased oral fluids to help thin secretions and will continue to monitor patient. Family updated on plan of care.   Fuller Mandril, RN

## 2018-12-16 NOTE — Progress Notes (Signed)
PHARMACY - PHYSICIAN COMMUNICATION CRITICAL VALUE ALERT - BLOOD CULTURE IDENTIFICATION (BCID)  Results for orders placed or performed during the hospital encounter of 12/15/18  Blood Culture ID Panel (Reflexed) (Collected: 12/15/2018  8:30 PM)  Result Value Ref Range   Enterococcus species NOT DETECTED NOT DETECTED   Listeria monocytogenes NOT DETECTED NOT DETECTED   Staphylococcus species NOT DETECTED NOT DETECTED   Staphylococcus aureus (BCID) NOT DETECTED NOT DETECTED   Streptococcus species DETECTED (A) NOT DETECTED   Streptococcus agalactiae NOT DETECTED NOT DETECTED   Streptococcus pneumoniae NOT DETECTED NOT DETECTED   Streptococcus pyogenes NOT DETECTED NOT DETECTED   Acinetobacter baumannii NOT DETECTED NOT DETECTED   Enterobacteriaceae species NOT DETECTED NOT DETECTED   Enterobacter cloacae complex NOT DETECTED NOT DETECTED   Escherichia coli NOT DETECTED NOT DETECTED   Klebsiella oxytoca NOT DETECTED NOT DETECTED   Klebsiella pneumoniae NOT DETECTED NOT DETECTED   Proteus species NOT DETECTED NOT DETECTED   Serratia marcescens NOT DETECTED NOT DETECTED   Haemophilus influenzae NOT DETECTED NOT DETECTED   Neisseria meningitidis NOT DETECTED NOT DETECTED   Pseudomonas aeruginosa NOT DETECTED NOT DETECTED   Candida albicans NOT DETECTED NOT DETECTED   Candida glabrata NOT DETECTED NOT DETECTED   Candida krusei NOT DETECTED NOT DETECTED   Candida parapsilosis NOT DETECTED NOT DETECTED   Candida tropicalis NOT DETECTED NOT DETECTED    Name of physician (or Provider) Contacted:  [place]   Changes to prescribed antibiotics required:  Yes, will d/c levaquin and start ceftriaxone 2 gm IV Q24H   Tanish Sinkler D 12/16/2018  3:25 PM

## 2018-12-16 NOTE — Progress Notes (Signed)
Chaplain responded to an OR for an AD. Pt daughter was at the bedside. Chaplain educated Pt and family on the AD. Chaplain offered and executed prayer.    12/16/18 1300  Clinical Encounter Type  Visited With Patient and family together  Visit Type Spiritual support  Spiritual Encounters  Spiritual Needs Brochure;Prayer

## 2018-12-16 NOTE — Progress Notes (Signed)
12/16/2018 8:09 PM  Spoke with patient's daughter Inocente Salles) on the phone. Updated daughter on patients current status. Plan is to continue IV antibiotics and breathing treatments to increase respiratory function. Daughter curious when her mother would be cleared to undergo the EGD. Stated to daughter that I can't give a definite timeline. Encouraged patient's daughter to be present when physician rounds in the morning.   Fuller Mandril, RN

## 2018-12-16 NOTE — Consult Note (Signed)
Jenna Lame, MD [facility]  [address], [name], [place] [address] Phone: [phone] Fax : [phone]  Consultation  Referring Provider:     Dr. Posey Pronto Primary Care Physician:  Jenna Market, MD Primary Gastroenterologist:  Althia Forts         Reason for Consultation:     Melena  Date of Admission:  12/15/2018 Date of Consultation:  12/16/2018         HPI:   [name] is a 84 y.o. female Who is admitted with shortness of breath.  The patient has chronic respiratory issues with COPD and has been found to have A. Fib and is on [place].  The patient began to be short of breath and had lower extremity swelling.  The patient also reports that she had been having dark stools for approximate 1 week.  The patient denies having dark stools in the past she also states that she has not had an EGD or colonoscopy in the past. The patient's hemoglobin 3 months ago was 9.5 with her admission hemoglobin of 5.5 yesterday.  The patient received 2 units of blood and this morning is 8.5. Due to her shortness of breath the patient Was checked for influenza A and B which were negative.  The patient had her at the regulation stopped on admission.There is no report of any abdominal pain nausea vomiting fevers or chills. The patient's chest x-ray showed a Partial clearing of the right upper lobe pneumonia that had previously been seen..   Past Medical History:  Diagnosis Date  . Bradycardia    a. beta blocker-induced  . Chronic hyponatremia   . Chronic respiratory failure with hypoxia (HCC)    a. secondary to COPD; b. on 2 L via [place] at baseline  . Clavicular fracture    History of   . COPD (chronic obstructive pulmonary disease) ([place])   . Gastroesophageal reflux disease   . Generalized and unspecified atherosclerosis   . HOH (hard of hearing)   . Hyperlipidemia   . Hypertension   . Mitral regurgitation    a. TTE 2015: LVSF nl, no RWMA, mild MR, trivial AI; b. TTE 9/19 EF 55-60%, no  RWMA, mild AI, LA normal in size, RVSF normal, PASP 33  . Orthopnea   . Osteoporosis   . PAF (paroxysmal atrial fibrillation) ([facility])    a. diagnosed 9/19: CHADS2VASc at least 5 (HTN, age x 2, vascular disease, female); b. Eliquis   . Tobacco abuse     Past Surgical History:  Procedure Laterality Date  . ABDOMINAL HYSTERECTOMY    . APPENDECTOMY  20+ years ago  . BACK SURGERY  2 years ago   x2  . Cardiac Event Monitor  February 2015   Sinus rhythm sinus bradycardia rates of 46-80 beats a minute. No other arrhythmias; occasional PACs with some being blocked., Occasional first-degree AV block.  Marland Kitchen CATARACT EXTRACTION W/PHACO Left 01/19/2017   Procedure: CATARACT EXTRACTION PHACO AND INTRAOCULAR LENS PLACEMENT (IOC);  Surgeon: Eulogio Bear, MD;  Location: [facility] ORS;  Service: Ophthalmology;  Laterality: Left;  us00:58.3 ap%14.8 cde8.63 lot # [id number]  . CATARACT EXTRACTION W/PHACO Right 02/16/2017   Procedure: CATARACT EXTRACTION PHACO AND INTRAOCULAR LENS PLACEMENT (IOC);  Surgeon: Eulogio Bear, MD;  Location: [facility] ORS;  Service: Ophthalmology;  Laterality: Right;  [place] 1:10.9 AP% 11.0 CDE 7.84 FLUID PACK LOT # [id number]  . TRANSTHORACIC ECHOCARDIOGRAM  01/21/2014   Normal LV size and function. No regional wall motion abnormalities. Mild  aortic sclerosis with trivial regurgitation.  . VESICOVAGINAL FISTULA CLOSURE W/ TAH  20+ years ago    Prior to Admission medications   Medication Sig Start Date End Date Taking? Authorizing Provider  apixaban (ELIQUIS) 2.5 MG TABS tablet Take 1 tablet (2.5 mg total) by mouth 2 (two) times daily. 09/07/18   Salary, Avel Peace, MD  budesonide-formoterol (SYMBICORT) 160-4.5 MCG/ACT inhaler Inhale 2 puffs into the lungs 2 (two) times daily.    [provider]  carvedilol (COREG) 6.25 MG tablet Take 6.25 mg by mouth 2 (two) times daily.     [provider]  cholecalciferol (VITAMIN D) 1000 units tablet Take 1,000 Units by mouth daily.     [provider]  ipratropium (ATROVENT) 0.02 % nebulizer solution Take 2.5 mLs (0.5 mg total) by nebulization 3 (three) times daily. 09/07/18   Salary, Avel Peace, MD  levalbuterol (XOPENEX) 0.63 MG/3ML nebulizer solution Take 3 mLs (0.63 mg total) by nebulization 3 (three) times daily. 09/07/18   Salary, Avel Peace, MD  Omega-3 Fatty Acids (FISH OIL) 1000 MG CAPS Take 1 capsule by mouth daily.    [provider]  oxybutynin (DITROPAN) 5 MG tablet Take 5 mg by mouth 2 (two) times daily. 12/26/16   [provider]  pantoprazole (PROTONIX) 40 MG tablet Take 1 tablet (40 mg total) by mouth daily. 09/08/18   Salary, Avel Peace, MD  pravastatin (PRAVACHOL) 40 MG tablet Take 40 mg by mouth daily.    [provider]  predniSONE (STERAPRED UNI-PAK 21 TAB) 10 MG (21) TBPK tablet Take as directed 09/07/18   Salary, Avel Peace, MD    Family History  Problem Relation Age of Onset  . Diabetes Mother   . Heart disease Sister   . Heart attack Sister   . Brain cancer Brother   . ALS Daughter      Social History   Tobacco Use  . Smoking status: Former Smoker    Packs/day: 1.50    Years: 50.00    Pack years: 75.00    Last attempt to quit: 02/16/2018    Years since quitting: 0.8  . Smokeless tobacco: Never Used  Substance Use Topics  . Alcohol use: No  . Drug use: No    Allergies as of 12/15/2018 - Review Complete 12/15/2018  Allergen Reaction Noted  . Penicillins Other (See Comments) 01/14/2014  . Sulfamethizole Other (See Comments) 01/14/2014    Review of Systems:    All systems reviewed and negative except where noted in HPI.   Physical Exam:  Vital signs in last 24 hours: Temp:  [98.4 F (36.9 C)-100 F (37.8 C)] 99.2 F (37.3 C) (12/29 1245) Pulse Rate:  [76-87] 78 (12/29 1245) Resp:  [16-29] 20 (12/29 1245) BP: (119-159)/(53-81) 124/53 (12/29 1245) SpO2:  [94 %-100 %] 100 % (12/29 1245) Weight:  [58 kg] 58 kg (12/28 2216) Last BM Date:  12/08/18 General:   Pleasant, cooperative in NAD Head:  Normocephalic and atraumatic. Eyes:   No icterus.   Conjunctiva pink. PERRLA. Ears:  Normal auditory acuity. Neck:  Supple; no masses or thyroidomegaly Lungs: Bilateral wheezing with decreased movement of air and rhonchi notedi.  Heart:  Irregular;  Without murmur, clicks, rubs or gallops Abdomen:  Soft, nondistended, nontender. Normal bowel sounds. No appreciable masses or hepatomegaly.  No rebound or guarding.  Rectal:  Not performed. Msk:  Symmetrical without gross deformities.    Extremities:  +2 pitting edema, cyanosis or clubbing. Neurologic:  Alert and oriented  x3;  grossly normal neurologically. Skin:  Intact without significant lesions or rashes. Cervical Nodes:  No significant cervical adenopathy. Psych:  Alert and cooperative. Normal affect.  LAB RESULTS: Recent Labs    12/15/18 1715 12/15/18 2242 12/16/18 0649  WBC 7.1  --  7.4  HGB 5.5* 5.3* 8.5*  HCT 19.0*  --  26.7*  PLT 318  --  305   BMET Recent Labs    12/15/18 1715 12/16/18 0649  NA 134* 135  K 3.2* 3.7  CL 100 102  CO2 26 25  GLUCOSE 112* 149*  BUN 16 12  CREATININE 0.78 0.62  CALCIUM 8.1* 8.1*   LFT No results for input(s): PROT, ALBUMIN, AST, ALT, ALKPHOS, BILITOT, BILIDIR, IBILI in the last 72 hours. PT/INR Recent Labs    12/15/18 2030  LABPROT 17.8*  INR 1.49    STUDIES: Dg Chest 2 View  Result Date: 12/15/2018 CLINICAL DATA:  COPD with increasing dyspnea for the past few days. EXAM: CHEST - 2 VIEW COMPARISON:  09/05/2018 FINDINGS: Emphysematous hyperinflation of the lungs. Normal heart size. Tortuous atherosclerotic aorta without aneurysm. Partial clearing of airspace opacities in the right upper lobe. Scarring is suggested at the left lung apex unchanged. No dominant mass or pulmonary consolidations. Subsegmental atelectasis is seen at the left lung base. No acute nor suspicious osseous abnormalities. IMPRESSION: Emphysematous  hyperinflation of the lungs. Partial clearing of right upper lobe pneumonia. No new pulmonary consolidation or CHF. Electronically Signed   By: Ashley Royalty M.D.   On: 12/15/2018 18:50      Impression / Plan:   Assessment: Active Problems:   Upper GI bleed   [name] is a 84 y.o. y/o female with Admission with shortness of breath and acute respiratory distress.  The patient has been found to have melena and significant anemia.  The patient was on anticoagulation which has been stopped.  She was taking this for her age of fibrillation   Plan:  This patient likely has had an upper GI bleed with melena and significant symptomatic anemia.  The patient is being treated for her respiratory issues. The patient is still short of breath and has a wet cough during my examination today.  The patient is not in any condition at this point to undergo any endoscopic procedures.  I recommend continuing to follow the patient's hemoglobin and optimize her respiratory status so that an upper endoscopy can be performed.  Thank you for involving me in the care of this patient.      LOS: 1 day   Jenna Lame, MD  12/16/2018, 12:55 PM    Note: This dictation was prepared with Dragon dictation along with smaller phrase technology. Any transcriptional errors that result from this process are unintentional.

## 2018-12-17 DIAGNOSIS — D649 Anemia, unspecified: Secondary | ICD-10-CM

## 2018-12-17 LAB — BPAM RBC
BLOOD PRODUCT EXPIRATION DATE: 202001012359
Blood Product Expiration Date: 202001222359
ISSUE DATE / TIME: 201912282322
ISSUE DATE / TIME: 201912290233
Unit Type and Rh: 5100
Unit Type and Rh: 9500

## 2018-12-17 LAB — TYPE AND SCREEN
ABO/RH(D): O POS
ANTIBODY SCREEN: NEGATIVE
UNIT DIVISION: 0
Unit division: 0

## 2018-12-17 MED ORDER — AZITHROMYCIN 250 MG PO TABS
250.0000 mg | ORAL_TABLET | Freq: Every day | ORAL | Status: AC
  Administered 2018-12-18 – 2018-12-21 (×4): 250 mg via ORAL
  Filled 2018-12-17 (×4): qty 1

## 2018-12-17 MED ORDER — AZITHROMYCIN 250 MG PO TABS: 250.0000 mg | ORAL_TABLET | Freq: Every day | ORAL | Status: DC

## 2018-12-17 MED ORDER — QUETIAPINE FUMARATE 25 MG PO TABS
25.0000 mg | ORAL_TABLET | Freq: Every evening | ORAL | Status: DC | PRN
  Administered 2018-12-17 – 2018-12-18 (×2): 25 mg via ORAL
  Filled 2018-12-17 (×2): qty 1

## 2018-12-17 MED ORDER — AZITHROMYCIN 250 MG PO TABS: 500.0000 mg | ORAL_TABLET | Freq: Every day | ORAL | Status: DC

## 2018-12-17 MED ORDER — PEG 3350-KCL-NA BICARB-NACL 420 G PO SOLR
4000.0000 mL | Freq: Once | ORAL | Status: DC
  Filled 2018-12-17: qty 4000

## 2018-12-17 MED ORDER — AZITHROMYCIN 250 MG PO TABS
500.0000 mg | ORAL_TABLET | Freq: Every day | ORAL | Status: AC
  Administered 2018-12-17: 500 mg via ORAL
  Filled 2018-12-17: qty 2

## 2018-12-17 MED ORDER — BISACODYL 5 MG PO TBEC
10.0000 mg | DELAYED_RELEASE_TABLET | Freq: Once | ORAL | Status: AC
  Administered 2018-12-17: 10 mg via ORAL
  Filled 2018-12-17: qty 2

## 2018-12-17 NOTE — Progress Notes (Signed)
[facility] NAME: [name]    MR#:  [id number]  DATE OF BIRTH:  [birth year]  SUBJECTIVE:  CHIEF COMPLAINT:   Chief Complaint  Patient presents with  . Shortness of Breath  Patient seen today No fevers On oxygen via nasal cannula No new episodes of any melena or vomiting of blood Has okay appetite No complaints of any chest pain  REVIEW OF SYSTEMS:    ROS  CONSTITUTIONAL: No documented fever. Has fatigue, weakness. No weight gain, no weight loss.  EYES: No blurry or double vision.  ENT: No tinnitus. No postnasal drip. No redness of the oropharynx.  RESPIRATORY: occasional cough, no wheeze, no hemoptysis. Has dyspnea.  CARDIOVASCULAR: No chest pain. No orthopnea. No palpitations. No syncope.  GASTROINTESTINAL: No nausea, no vomiting or diarrhea. No abdominal pain. No melena or hematochezia.  GENITOURINARY: No dysuria or hematuria.  ENDOCRINE: No polyuria or nocturia. No heat or cold intolerance.  HEMATOLOGY: No anemia. No bruising. No bleeding.  INTEGUMENTARY: No rashes. No lesions.  MUSCULOSKELETAL: No arthritis. No swelling. No gout.  NEUROLOGIC: No numbness, tingling, or ataxia. No seizure-type activity.  PSYCHIATRIC: No anxiety. No insomnia. No ADD.   DRUG ALLERGIES:   Allergies  Allergen Reactions  . Penicillins Other (See Comments)    Patient unsure of what reaction was. Has patient had a PCN reaction causing immediate rash, facial/tongue/throat swelling, SOB or lightheadedness with hypotension: unknown Has patient had a PCN reaction causing severe rash involving mucus membranes or skin necrosis: uknown Has patient had a PCN reaction that required hospitalization: unknown Has patient had a PCN reaction occurring within the last 10 years: No If all of the above answers are "NO", then may proceed with Cephalosporin use.   Francine Graven Other (See Comments)    Unknown reaction. Last used long time ago. Quit  taking,.    VITALS:  Blood pressure (!) 146/64, pulse 83, temperature 98.2 F (36.8 C), temperature source Oral, resp. rate 18, height 5\' 2"  (1.575 m), weight 58 kg, SpO2 98 %.  PHYSICAL EXAMINATION:   Physical Exam  GENERAL:  84 y.o.-year-old patient lying in the bed with no acute distress.  EYES: Pupils equal, round, reactive to light and accommodation. No scleral icterus. Extraocular muscles intact.  HEENT: Head atraumatic, normocephalic. Oropharynx and nasopharynx clear.  NECK:  Supple, no jugular venous distention. No thyroid enlargement, no tenderness.  LUNGS: Improved breath sounds bilaterally, scattered rales in both lungs. No use of accessory muscles of respiration.  CARDIOVASCULAR: S1, S2 normal. No murmurs, rubs, or gallops.  ABDOMEN: Soft, nontender, nondistended. Bowel sounds present. No organomegaly or mass.  EXTREMITIES: No cyanosis, mild edema b/l.    NEUROLOGIC: Cranial nerves II through XII are intact. No focal Motor or sensory deficits b/l.   PSYCHIATRIC: The patient is alert and oriented x 3.  SKIN: No obvious rash, lesion, or ulcer.   LABORATORY PANEL:   CBC Recent Labs  Lab 12/16/18 0649  12/16/18 2034  WBC 7.4  --   --   HGB 8.5*   < > 8.6*  HCT 26.7*  --   --   PLT 305  --   --    < > = values in this interval not displayed.   ------------------------------------------------------------------------------------------------------------------ Chemistries  Recent Labs  Lab 12/16/18 0649  NA 135  K 3.7  CL 102  CO2 25  GLUCOSE 149*  BUN 12  CREATININE 0.62  CALCIUM 8.1*   ------------------------------------------------------------------------------------------------------------------  Cardiac Enzymes No results for input(s): TROPONINI in the last 168 hours. ------------------------------------------------------------------------------------------------------------------  RADIOLOGY:  Dg Chest 2 View  Result Date: 12/15/2018 CLINICAL DATA:   COPD with increasing dyspnea for the past few days. EXAM: CHEST - 2 VIEW COMPARISON:  09/05/2018 FINDINGS: Emphysematous hyperinflation of the lungs. Normal heart size. Tortuous atherosclerotic aorta without aneurysm. Partial clearing of airspace opacities in the right upper lobe. Scarring is suggested at the left lung apex unchanged. No dominant mass or pulmonary consolidations. Subsegmental atelectasis is seen at the left lung base. No acute nor suspicious osseous abnormalities. IMPRESSION: Emphysematous hyperinflation of the lungs. Partial clearing of right upper lobe pneumonia. No new pulmonary consolidation or CHF. Electronically Signed   By: Ashley Royalty M.D.   On: 12/15/2018 18:50     ASSESSMENT AND PLAN:   84 year old female patient with history of COPD, proximal atrial fibrillation, hyperlipidemia, hypertension, osteoporosis currently under hospitalist service for shortness of breath and GI bleed  -Streptococcal bacteremia Continue IV Rocephin antibiotic Follow-up sensitivities  -Acute on chronic COPD exacerbation Discontinue IV Solu-Medrol Start oral steroids Continue Rocephin and Zithromax antibiotics Continue nebulization treatments Oxygen  via nasal cannula Flu test negative  -Gastrointestinal bleeding resolved Status post evaluation by GI consultant S/p prbc transfusion Advised no endoscopy or colonoscopy in view of poor pulmonary status Eliquis on hold Hemoglobin hematocrit stable Resume diet  -Paroxysmal atrial fibrillation Hold Eliquis in view of GI bleed Continue rate control  -Pneumonia Antibiotics switched to Rocephin and Zithromax  -DVT prophylaxis sequential compression device to lower extremities  All the records are reviewed and case discussed with Care Management/Social Worker. Management plans discussed with the patient, family and they are in agreement.  CODE STATUS:Full code  DVT Prophylaxis: SCDs  TOTAL TIME TAKING CARE OF THIS PATIENT: 33  minutes.   POSSIBLE D/C IN 2 to 3 DAYS, DEPENDING ON CLINICAL CONDITION.  Saundra Shelling M.D on 12/17/2018 at 2:29 PM  Between 7am to 6pm - Pager - [phone]  After 6pm go to [email or web address] - password [email or web address] [facility]  Office  [phone]  CC: Primary care physician; [name], MD  Note: This dictation was prepared with Dragon dictation along with smaller phrase technology. Any transcriptional errors that result from this process are unintentional.

## 2018-12-17 NOTE — Evaluation (Signed)
Physical Therapy Evaluation Patient Details Name: [name] MRN: [id number] DOB: Feb 20, 1933 Today's Date: 12/17/2018   History of Present Illness  Pt is a 84 y/o F who presented with cough, SOB, swelling in LEs, dark tarry stools.  Admitted with diagnosis of upper GI bleed with acute on chronic COPD exacerbation along with pneumonia.  Pt's PMH includes bradycardia, COPD, osteoporosis, O2 at night, recent a-fib diagnosis, back surgery.      Clinical Impression  Pt admitted with above diagnosis. Pt currently with functional limitations due to the deficits listed below (see PT Problem List). [name] appears to be close to her baseline level of mobility; however, she does require cues for safe technique with sit<>stand using RW and she does require min guard assist while ambulating due to mild instability.  SpO2 drops to 89% on RA after ambulating 20 ft.  Remains at or above 91% on 1L for remainder of ambulation. SpO2 remains at or above 92% on RA with bed mobility and sit<>stand transfers.  Pt will benefit from skilled PT to increase their independence and safety with mobility to allow discharge to the venue listed below.      Follow Up Recommendations Home health PT    Equipment Recommendations  None recommended by PT    Recommendations for Other Services       Precautions / Restrictions Precautions Precautions: Fall;Other (comment) Precaution Comments: O2 Restrictions Weight Bearing Restrictions: No      Mobility  Bed Mobility Overal bed mobility: Independent             General bed mobility comments: No physical assist or cues needed.  Pt performs independently  Transfers Overall transfer level: Needs assistance Equipment used: Rolling walker (2 wheeled) Transfers: Sit to/from Stand Sit to Stand: Min guard         General transfer comment: Mild instability but no LOB.  Cues for proper hand placement as pt initially attempts to perform sit<>stand with Bil  hands on RW.   Ambulation/Gait Ambulation/Gait assistance: Min guard Gait Distance (Feet): 40 Feet Assistive device: Rolling walker (2 wheeled) Gait Pattern/deviations: Decreased step length - right;Decreased step length - left;Trunk flexed     General Gait Details: Cues for pursed lip breathing.  SpO2 drops to 89% on RA after ambulating 20 ft.  Remains at or above 91% on 1L for remainder of ambulation.    Stairs            Wheelchair Mobility    Modified Rankin (Stroke Patients Only)       Balance Overall balance assessment: Needs assistance Sitting-balance support: No upper extremity supported;Feet supported Sitting balance-Leahy Scale: Good     Standing balance support: Single extremity supported;During functional activity Standing balance-Leahy Scale: Poor Standing balance comment: Pt relies on at least 1UE support for static and dynamic activities                             Pertinent Vitals/Pain Pain Assessment: No/denies pain    Home Living Family/patient expects to be discharged to:: Private residence Living Arrangements: Children Available Help at Discharge: Family;Available PRN/intermittently(daughter works during the day) Type of Home: House Home Access: Stairs to [employer] entry Entrance Stairs-Rails: Right(one entrance with no steps, prefers entrance with steps) Entrance Stairs-Number of Steps: 2 Home Layout: One level Home Equipment: Walker - 2 wheels;Cane - single point;Shower seat;Grab bars - tub/shower(SPC and hurry cane)      Prior Function Level  of Independence: Independent with assistive device(s)         Comments: Pt ambulates household distances with RW and uses SPC in community.  No falls in the past 6 months. Ind with bathing, dressing.  Does some of the cooking.  Hasn't driven in 1 year.      Hand Dominance   Dominant Hand: Right    Extremity/Trunk Assessment   Upper Extremity Assessment Upper Extremity  Assessment: Overall WFL for tasks assessed    Lower Extremity Assessment Lower Extremity Assessment: (BLE strength grossly 4/5)       Communication   Communication: No difficulties  Cognition Arousal/Alertness: Awake/alert Behavior During Therapy: WFL for tasks assessed/performed Overall Cognitive Status: Within Functional Limits for tasks assessed                                        General Comments General comments (skin integrity, edema, etc.): SpO2 remains at or above 92% on RA with bed mobility and sit<>stand transfers.     Exercises     Assessment/Plan    PT Assessment Patient needs continued PT services  PT Problem List Decreased strength;Decreased balance;Decreased safety awareness;Decreased knowledge of use of DME;Cardiopulmonary status limiting activity       PT Treatment Interventions DME instruction;Gait training;Stair training;Functional mobility training;Therapeutic exercise;Therapeutic activities;Balance training;Neuromuscular re-education;Patient/family education    PT Goals (Current goals can be found in the Care Plan section)  Acute Rehab PT Goals Patient Stated Goal: to go home PT Goal Formulation: With patient Time For Goal Achievement: 12/31/18 Potential to Achieve Goals: Good    Frequency Min 2X/week   Barriers to discharge        Co-evaluation               AM-PAC PT "6 Clicks" Mobility  Outcome Measure Help needed turning from your back to your side while in a flat bed without using bedrails?: None Help needed moving from lying on your back to sitting on the side of a flat bed without using bedrails?: None Help needed moving to and from a bed to a chair (including a wheelchair)?: A Little Help needed standing up from a chair using your arms (e.g., wheelchair or bedside chair)?: A Little Help needed to walk in hospital room?: A Little Help needed climbing 3-5 steps with a railing? : A Little 6 Click Score: 20     End of Session Equipment Utilized During Treatment: Gait belt;Oxygen Activity Tolerance: Patient tolerated treatment well Patient left: in chair;with call bell/phone within reach;with chair alarm set(pt agreeable to call RN for any OOB/chair mobility) Nurse Communication: Mobility status;Other (comment)(SpO2, no chair alarm box in room) PT Visit Diagnosis: Unsteadiness on feet (R26.81);Other abnormalities of gait and mobility (R26.89);Muscle weakness (generalized) (M62.81)    Time: 0712-1975 PT Time Calculation (min) (ACUTE ONLY): 28 min   Charges:   PT Evaluation $PT Eval Low Complexity: 1 Low PT Treatments $Gait Training: 8-22 mins        Collie Siad PT, DPT 12/17/2018, 2:14 PM

## 2018-12-17 NOTE — Progress Notes (Signed)
Jenna Antigua, MD [address] [place], [place], [place], [address] [address], [place], [place], [place], [address] Phone: [phone]  Fax: [phone]   Subjective:  Patient sitting up in chair comfortably.  Shortness of breath improved, currently on nasal cannula.  Objective: Exam: Vital signs in last 24 hours: Vitals:   12/16/18 2115 12/16/18 2137 12/17/18 0623 12/17/18 0942  BP:  (!) 158/69 (!) 146/64   Pulse:  71 79 83  Resp:  (!) 22 20 18   Temp:  98.3 F (36.8 C) 98.2 F (36.8 C)   TempSrc:  Oral Oral   SpO2: 96% 99% 97% 98%  Weight:      Height:       Weight change:   Intake/Output Summary (Last 24 hours) at 12/17/2018 1326 Last data filed at 12/17/2018 2263 Gross per 24 hour  Intake 2253.26 ml  Output 151 ml  Net 2102.26 ml    General: No acute distress, AAO x3 Abd: Soft, NT/ND, No HSM Skin: Warm, no rashes Neck: Supple, Trachea midline   Lab Results: Lab Results  Component Value Date   WBC 7.4 12/16/2018   HGB 8.6 (L) 12/16/2018   HCT 26.7 (L) 12/16/2018   MCV 84.0 12/16/2018   PLT 305 12/16/2018   Micro Results: Recent Results (from the past 240 hour(s))  Blood culture (routine x 2)     Status: None (Preliminary result)   Collection Time: 12/15/18  8:30 PM  Result Value Ref Range Status   Specimen Description BLOOD LEFT ANTECUBITAL  Final   Special Requests   Final    BOTTLES DRAWN AEROBIC AND ANAEROBIC Blood Culture results may not be optimal due to an excessive volume of blood received in culture bottles Performed at [facility], [address] [place], [place] [address]    Culture  Setup Time   Final    GRAM POSITIVE COCCI AEROBIC BOTTLE ONLY CRITICAL RESULT CALLED TO, READ BACK BY AND VERIFIED WITH: JASON ROBBINS AT 1503 12/16/18.PMF    Culture GRAM POSITIVE COCCI  Final   Report Status PENDING  Incomplete  Blood Culture ID Panel (Reflexed)     Status: Abnormal   Collection Time: 12/15/18  8:30 PM    Result Value Ref Range Status   Enterococcus species NOT DETECTED NOT DETECTED Final   Listeria monocytogenes NOT DETECTED NOT DETECTED Final   Staphylococcus species NOT DETECTED NOT DETECTED Final   Staphylococcus aureus (BCID) NOT DETECTED NOT DETECTED Final   Streptococcus species DETECTED (A) NOT DETECTED Final    Comment: Not Enterococcus species, Streptococcus agalactiae, Streptococcus pyogenes, or Streptococcus pneumoniae. CRITICAL RESULT CALLED TO, READ BACK BY AND VERIFIED WITH: JASON ROBBINS AT 1503 12/16/18.PMF    Streptococcus agalactiae NOT DETECTED NOT DETECTED Final   Streptococcus pneumoniae NOT DETECTED NOT DETECTED Final   Streptococcus pyogenes NOT DETECTED NOT DETECTED Final   Acinetobacter baumannii NOT DETECTED NOT DETECTED Final   Enterobacteriaceae species NOT DETECTED NOT DETECTED Final   Enterobacter cloacae complex NOT DETECTED NOT DETECTED Final   Escherichia coli NOT DETECTED NOT DETECTED Final   Klebsiella oxytoca NOT DETECTED NOT DETECTED Final   Klebsiella pneumoniae NOT DETECTED NOT DETECTED Final   Proteus species NOT DETECTED NOT DETECTED Final   Serratia marcescens NOT DETECTED NOT DETECTED Final   Haemophilus influenzae NOT DETECTED NOT DETECTED Final   Neisseria meningitidis NOT DETECTED NOT DETECTED Final   Pseudomonas aeruginosa NOT DETECTED NOT DETECTED Final   Candida albicans NOT DETECTED NOT DETECTED Final  Candida glabrata NOT DETECTED NOT DETECTED Final   Candida krusei NOT DETECTED NOT DETECTED Final   Candida parapsilosis NOT DETECTED NOT DETECTED Final   Candida tropicalis NOT DETECTED NOT DETECTED Final    Comment: Performed at [facility], [address] [place], [place] [address]  Blood culture (routine x 2)     Status: None (Preliminary result)   Collection Time: 12/15/18  8:31 PM  Result Value Ref Range Status   Specimen Description BLOOD LEFT FOREARM  Final   Special Requests   Final    BOTTLES DRAWN AEROBIC AND  ANAEROBIC Blood Culture adequate volume   Culture   Final    NO GROWTH 2 DAYS Performed at [facility], [address] [place], [place] [address]    Report Status PENDING  Incomplete   Studies/Results: Dg Chest 2 View  Result Date: 12/15/2018 CLINICAL DATA:  COPD with increasing dyspnea for the past few days. EXAM: CHEST - 2 VIEW COMPARISON:  09/05/2018 FINDINGS: Emphysematous hyperinflation of the lungs. Normal heart size. Tortuous atherosclerotic aorta without aneurysm. Partial clearing of airspace opacities in the right upper lobe. Scarring is suggested at the left lung apex unchanged. No dominant mass or pulmonary consolidations. Subsegmental atelectasis is seen at the left lung base. No acute nor suspicious osseous abnormalities. IMPRESSION: Emphysematous hyperinflation of the lungs. Partial clearing of right upper lobe pneumonia. No new pulmonary consolidation or CHF. Electronically Signed   By: Ashley Royalty M.D.   On: 12/15/2018 18:50   Medications:  Scheduled Meds: . budesonide (PULMICORT) nebulizer solution  0.25 mg Nebulization BID  . carvedilol  6.25 mg Oral BID  . cholecalciferol  1,000 Units Oral Daily  . guaiFENesin  600 mg Oral BID  . ipratropium-albuterol  3 mL Nebulization Q6H  . methylPREDNISolone (SOLU-MEDROL) injection  60 mg Intravenous Q6H  . omega-3 acid ethyl esters  1 g Oral Daily  . oxybutynin  5 mg Oral BID  . [START ON 12/19/2018] pantoprazole  40 mg Intravenous Q12H  . pravastatin  40 mg Oral Daily   Continuous Infusions: . sodium chloride 50 mL/hr at 12/17/18 0600  . cefTRIAXone (ROCEPHIN)  IV Stopped (12/16/18 1812)   PRN Meds:.acetaminophen **OR** acetaminophen, guaiFENesin-dextromethorphan, ondansetron **OR** ondansetron (ZOFRAN) IV   Assessment: Active Problems:   Upper GI bleed   Gastrointestinal hemorrhage    Plan: Hemoglobin has been stable on the last 3 checks around 8.5. No signs of active GI bleeding.  Patient is awaiting  medical optimization prior to any endoscopic procedures  Patient currently on Solu-Medrol nebulization treatments for acute on chronic COPD exacerbation  Patient is also on Levaquin for pneumonia with improvement seen on chest x-ray on 12/28  Patient is willing to proceed with endoscopic procedures tomorrow. I have discussed alternative options, risks & benefits,  which include, but are not limited to, bleeding, infection, perforation,respiratory complication & drug reaction.  The patient agrees with this plan & written consent will be obtained.    We will potentially plan for procedures tomorrow, as long as anesthesia is willing to sedate the patient after their evaluation tomorrow.  Continue medical optimization PPI IV twice daily  Continue serial CBCs and transfuse PRN Avoid NSAIDs Maintain 2 large-bore IV lines Please page GI with any acute hemodynamic changes, or signs of active GI bleeding    LOS: 2 days   Jenna Antigua, MD 12/17/2018, 1:26 PM

## 2018-12-18 ENCOUNTER — Inpatient Hospital Stay: Payer: Medicare HMO | Admitting: Anesthesiology

## 2018-12-18 ENCOUNTER — Encounter: Payer: Self-pay | Admitting: *Deleted

## 2018-12-18 ENCOUNTER — Encounter
Admission: EM | Disposition: A | Discharge status: Home / Self Care | Payer: Self-pay | Source: Home / Self Care | Attending: Internal Medicine

## 2018-12-18 DIAGNOSIS — D509 Iron deficiency anemia, unspecified: Secondary | ICD-10-CM

## 2018-12-18 HISTORY — PX: ESOPHAGOGASTRODUODENOSCOPY: SHX5428

## 2018-12-18 LAB — CBC
HCT: 23.6 % — ABNORMAL LOW (ref 36.0–46.0)
Hemoglobin: 7.3 g/dL — ABNORMAL LOW (ref 12.0–15.0)
MCH: 26.4 pg (ref 26.0–34.0)
MCHC: 30.9 g/dL (ref 30.0–36.0)
MCV: 85.5 fL (ref 80.0–100.0)
NRBC: 0 % (ref 0.0–0.2)
Platelets: 308 10*3/uL (ref 150–400)
RBC: 2.76 MIL/uL — ABNORMAL LOW (ref 3.87–5.11)
RDW: 14.9 % (ref 11.5–15.5)
WBC: 8.2 10*3/uL (ref 4.0–10.5)

## 2018-12-18 LAB — CULTURE, BLOOD (ROUTINE X 2)

## 2018-12-18 SURGERY — EGD (ESOPHAGOGASTRODUODENOSCOPY)
Anesthesia: General

## 2018-12-18 MED ORDER — ALBUTEROL SULFATE (2.5 MG/3ML) 0.083% IN NEBU: 2.5000 mg | INHALATION_SOLUTION | RESPIRATORY_TRACT | Status: DC | PRN

## 2018-12-18 MED ORDER — IPRATROPIUM-ALBUTEROL 0.5-2.5 (3) MG/3ML IN SOLN
3.0000 mL | Freq: Three times a day (TID) | RESPIRATORY_TRACT | Status: DC
  Administered 2018-12-18 – 2018-12-20 (×6): 3 mL via RESPIRATORY_TRACT
  Filled 2018-12-18 (×7): qty 3

## 2018-12-18 MED ORDER — BISACODYL 5 MG PO TBEC
10.0000 mg | DELAYED_RELEASE_TABLET | Freq: Once | ORAL | Status: AC
  Administered 2018-12-19: 10 mg via ORAL
  Filled 2018-12-18 (×2): qty 2

## 2018-12-18 MED ORDER — PROPOFOL 10 MG/ML IV BOLUS
INTRAVENOUS | Status: DC | PRN
  Administered 2018-12-18: 30 mg via INTRAVENOUS

## 2018-12-18 MED ORDER — PEG 3350-KCL-NA BICARB-NACL 420 G PO SOLR
4000.0000 mL | Freq: Once | ORAL | Status: AC
  Administered 2018-12-19: 4000 mL via ORAL
  Filled 2018-12-18 (×2): qty 4000

## 2018-12-18 MED ORDER — PROPOFOL 500 MG/50ML IV EMUL
INTRAVENOUS | Status: DC | PRN
  Administered 2018-12-18: 125 ug/kg/min via INTRAVENOUS

## 2018-12-18 MED ORDER — LIDOCAINE HCL (CARDIAC) PF 100 MG/5ML IV SOSY
PREFILLED_SYRINGE | INTRAVENOUS | Status: DC | PRN
  Administered 2018-12-18: 40 mg via INTRAVENOUS

## 2018-12-18 MED ORDER — PREDNISONE 20 MG PO TABS
40.0000 mg | ORAL_TABLET | Freq: Every day | ORAL | Status: DC
  Administered 2018-12-19 – 2018-12-21 (×3): 40 mg via ORAL
  Filled 2018-12-18 (×3): qty 2

## 2018-12-18 NOTE — Progress Notes (Signed)
[facility] NAME: [name]    MR#:  [id number]  DATE OF BIRTH:  November 12, 1933  SUBJECTIVE:  CHIEF COMPLAINT:   Chief Complaint  Patient presents with  . Shortness of Breath  Patient seen today No fevers On oxygen via nasal cannula No new episodes of any melena or vomiting of blood Had upper endoscopy today No complaints of any chest pain  REVIEW OF SYSTEMS:    ROS  CONSTITUTIONAL: No documented fever. Has fatigue, weakness. No weight gain, no weight loss.  EYES: No blurry or double vision.  ENT: No tinnitus. No postnasal drip. No redness of the oropharynx.  RESPIRATORY: occasional cough, no wheeze, no hemoptysis. Has dyspnea.  CARDIOVASCULAR: No chest pain. No orthopnea. No palpitations. No syncope.  GASTROINTESTINAL: No nausea, no vomiting or diarrhea. No abdominal pain. No melena or hematochezia.  GENITOURINARY: No dysuria or hematuria.  ENDOCRINE: No polyuria or nocturia. No heat or cold intolerance.  HEMATOLOGY: No anemia. No bruising. No bleeding.  INTEGUMENTARY: No rashes. No lesions.  MUSCULOSKELETAL: No arthritis. No swelling. No gout.  NEUROLOGIC: No numbness, tingling, or ataxia. No seizure-type activity.  PSYCHIATRIC: No anxiety. No insomnia. No ADD.   DRUG ALLERGIES:   Allergies  Allergen Reactions  . Penicillins Other (See Comments)    Patient unsure of what reaction was. Has patient had a PCN reaction causing immediate rash, facial/tongue/throat swelling, SOB or lightheadedness with hypotension: unknown Has patient had a PCN reaction causing severe rash involving mucus membranes or skin necrosis: uknown Has patient had a PCN reaction that required hospitalization: unknown Has patient had a PCN reaction occurring within the last 10 years: No If all of the above answers are "NO", then may proceed with Cephalosporin use.   Francine Graven Other (See Comments)    Unknown reaction. Last used long time ago.  Quit taking,.    VITALS:  Blood pressure (!) 174/84, pulse 71, temperature 98.7 F (37.1 C), temperature source Oral, resp. rate 20, height 5\' 2"  (1.575 m), weight 58 kg, SpO2 100 %.  PHYSICAL EXAMINATION:   Physical Exam  GENERAL:  84 y.o.-year-old patient lying in the bed with no acute distress.  EYES: Pupils equal, round, reactive to light and accommodation. No scleral icterus. Extraocular muscles intact.  HEENT: Head atraumatic, normocephalic. Oropharynx and nasopharynx clear.  NECK:  Supple, no jugular venous distention. No thyroid enlargement, no tenderness.  LUNGS: Improved breath sounds bilaterally, scattered rales in both lungs. No use of accessory muscles of respiration.  CARDIOVASCULAR: S1, S2 normal. No murmurs, rubs, or gallops.  ABDOMEN: Soft, nontender, nondistended. Bowel sounds present. No organomegaly or mass.  EXTREMITIES: No cyanosis, mild edema b/l.    NEUROLOGIC: Cranial nerves II through XII are intact. No focal Motor or sensory deficits b/l.   PSYCHIATRIC: The patient is alert and oriented x 3.  SKIN: No obvious rash, lesion, or ulcer.   LABORATORY PANEL:   CBC Recent Labs  Lab 12/18/18 0450  WBC 8.2  HGB 7.3*  HCT 23.6*  PLT 308   ------------------------------------------------------------------------------------------------------------------ Chemistries  Recent Labs  Lab 12/16/18 0649  NA 135  K 3.7  CL 102  CO2 25  GLUCOSE 149*  BUN 12  CREATININE 0.62  CALCIUM 8.1*   ------------------------------------------------------------------------------------------------------------------  Cardiac Enzymes No results for input(s): TROPONINI in the last 168 hours. ------------------------------------------------------------------------------------------------------------------  RADIOLOGY:  No results found.   ASSESSMENT AND PLAN:   84 year old female patient with history of COPD, proximal atrial fibrillation,  hyperlipidemia, hypertension,  osteoporosis currently under hospitalist service for shortness of breath and GI bleed  -Streptococcal bacteremia Streptococcus viridans noted Significance unknown On IV Rocephin antibiotic Stop antibiotic after 5 days  -Acute on chronic COPD exacerbation improving Discontinued IV Solu-Medrol on oral steroids Continue Rocephin and Zithromax antibiotics Continue nebulization treatments Oxygen  via nasal cannula Flu test negative  -Gastrointestinal bleeding  Status post evaluation by GI consultant S/p prbc transfusion Had upper endoscopy today Endoscopy was a normal study Plan for colonoscopy on Thursday Solid food today Clear liquid diet from tomorrow And bowel preparation tomorrow night Eliquis on hold Hemoglobin hematocrit follow-up PRBC transfusion if hemoglobin less than 7 Resume diet  -Paroxysmal atrial fibrillation Hold Eliquis in view of GI bleed Continue rate control  -Pneumonia resolving Currently on Rocephin and Zithromax antibiotics  -DVT prophylaxis sequential compression device to lower extremities  All the records are reviewed and case discussed with Care Management/Social Worker. Management plans discussed with the patient, family and they are in agreement.  CODE STATUS:Full code  DVT Prophylaxis: SCDs  TOTAL TIME TAKING CARE OF THIS PATIENT: 35 minutes.   POSSIBLE D/C IN 2 to 3 DAYS, DEPENDING ON CLINICAL CONDITION.  Saundra Shelling M.D on 12/18/2018 at 2:38 PM  Between 7am to 6pm - Pager - [phone]  After 6pm go to [email or web address] - password [email or web address] [facility]  Office  [phone]  CC: Primary care physician; [name], MD  Note: This dictation was prepared with Dragon dictation along with smaller phrase technology. Any transcriptional errors that result from this process are unintentional.

## 2018-12-18 NOTE — Anesthesia Post-op Follow-up Note (Signed)
Anesthesia QCDR form completed.        

## 2018-12-18 NOTE — Progress Notes (Signed)
Jenna Antigua, MD [address] [place], [place], [place], [address] [address], [place], [place], [place], [address] Phone: [phone]  Fax: [phone]   Subjective: Patient resting in her chair comfortably today.  No further episodes of bleeding.  No nausea or vomiting.  Hemoglobin declined to 7.3 today.   Objective: Exam: Vital signs in last 24 hours: Vitals:   12/18/18 0500 12/18/18 0838 12/18/18 0852 12/18/18 1143  BP: (!) 125/54  (!) 176/74 (!) 159/70  Pulse: 67 83  72  Resp: 20 16  20   Temp: 98.4 F (36.9 C)   98.5 F (36.9 C)  TempSrc: Oral   Oral  SpO2: 99% 97%  97%  Weight:      Height:       Weight change:  No intake or output data in the 24 hours ending 12/18/18 1213  General: No acute distress, AAO x3 Abd: Soft, NT/ND, No HSM Skin: Warm, no rashes Neck: Supple, Trachea midline   Lab Results: Lab Results  Component Value Date   WBC 8.2 12/18/2018   HGB 7.3 (L) 12/18/2018   HCT 23.6 (L) 12/18/2018   MCV 85.5 12/18/2018   PLT 308 12/18/2018   Micro Results: Recent Results (from the past 240 hour(s))  Blood culture (routine x 2)     Status: Abnormal   Collection Time: 12/15/18  8:30 PM  Result Value Ref Range Status   Specimen Description   Final    BLOOD LEFT ANTECUBITAL Performed at [facility], [address] [place], [place] [address]    Special Requests   Final    BOTTLES DRAWN AEROBIC AND ANAEROBIC Blood Culture results may not be optimal due to an excessive volume of blood received in culture bottles Performed at [facility], [address] [place], [place] [address]    Culture  Setup Time   Final    GRAM POSITIVE COCCI AEROBIC BOTTLE ONLY CRITICAL RESULT CALLED TO, READ BACK BY AND VERIFIED WITH: JASON ROBBINS AT 7782 12/16/18.PMF    Culture (A)  Final    VIRIDANS STREPTOCOCCUS THE SIGNIFICANCE OF ISOLATING THIS ORGANISM FROM A SINGLE SET OF BLOOD CULTURES WHEN MULTIPLE SETS ARE DRAWN IS  UNCERTAIN. PLEASE NOTIFY THE MICROBIOLOGY DEPARTMENT WITHIN ONE WEEK IF SPECIATION AND SENSITIVITIES ARE REQUIRED. Performed at [facility], [place] [address] [place],  [address]    Report Status 12/18/2018 FINAL  Final  Blood Culture ID Panel (Reflexed)     Status: Abnormal   Collection Time: 12/15/18  8:30 PM  Result Value Ref Range Status   Enterococcus species NOT DETECTED NOT DETECTED Final   Listeria monocytogenes NOT DETECTED NOT DETECTED Final   Staphylococcus species NOT DETECTED NOT DETECTED Final   Staphylococcus aureus (BCID) NOT DETECTED NOT DETECTED Final   Streptococcus species DETECTED (A) NOT DETECTED Final    Comment: Not Enterococcus species, Streptococcus agalactiae, Streptococcus pyogenes, or Streptococcus pneumoniae. CRITICAL RESULT CALLED TO, READ BACK BY AND VERIFIED WITH: JASON ROBBINS AT 1503 12/16/18.PMF    Streptococcus agalactiae NOT DETECTED NOT DETECTED Final   Streptococcus pneumoniae NOT DETECTED NOT DETECTED Final   Streptococcus pyogenes NOT DETECTED NOT DETECTED Final   Acinetobacter baumannii NOT DETECTED NOT DETECTED Final   Enterobacteriaceae species NOT DETECTED NOT DETECTED Final   Enterobacter cloacae complex NOT DETECTED NOT DETECTED Final   Escherichia coli NOT DETECTED NOT DETECTED Final   Klebsiella oxytoca NOT DETECTED NOT DETECTED Final   Klebsiella pneumoniae NOT DETECTED NOT DETECTED Final  Proteus species NOT DETECTED NOT DETECTED Final   Serratia marcescens NOT DETECTED NOT DETECTED Final   Haemophilus influenzae NOT DETECTED NOT DETECTED Final   Neisseria meningitidis NOT DETECTED NOT DETECTED Final   Pseudomonas aeruginosa NOT DETECTED NOT DETECTED Final   Candida albicans NOT DETECTED NOT DETECTED Final   Candida glabrata NOT DETECTED NOT DETECTED Final   Candida krusei NOT DETECTED NOT DETECTED Final   Candida parapsilosis NOT DETECTED NOT DETECTED Final   Candida tropicalis NOT DETECTED NOT DETECTED Final     Comment: Performed at [facility], [address] [place], [place] [address]  Blood culture (routine x 2)     Status: None (Preliminary result)   Collection Time: 12/15/18  8:31 PM  Result Value Ref Range Status   Specimen Description BLOOD LEFT FOREARM  Final   Special Requests   Final    BOTTLES DRAWN AEROBIC AND ANAEROBIC Blood Culture adequate volume   Culture   Final    NO GROWTH 3 DAYS Performed at [facility], [address] [place], [place] [address]    Report Status PENDING  Incomplete   Studies/Results: No results found. Medications:  Scheduled Meds: . azithromycin  250 mg Oral Daily  . budesonide (PULMICORT) nebulizer solution  0.25 mg Nebulization BID  . carvedilol  6.25 mg Oral BID  . cholecalciferol  1,000 Units Oral Daily  . guaiFENesin  600 mg Oral BID  . ipratropium-albuterol  3 mL Nebulization Q6H  . methylPREDNISolone (SOLU-MEDROL) injection  60 mg Intravenous Q6H  . omega-3 acid ethyl esters  1 g Oral Daily  . oxybutynin  5 mg Oral BID  . [START ON 12/19/2018] pantoprazole  40 mg Intravenous Q12H  . polyethylene glycol-electrolytes  4,000 mL Oral Once  . pravastatin  40 mg Oral Daily   Continuous Infusions: . sodium chloride 50 mL/hr at 12/17/18 2241  . cefTRIAXone (ROCEPHIN)  IV 2 g (12/17/18 1518)   PRN Meds:.acetaminophen **OR** acetaminophen, guaiFENesin-dextromethorphan, ondansetron **OR** ondansetron (ZOFRAN) IV, QUEtiapine   Assessment: Active Problems:   Upper GI bleed   Gastrointestinal hemorrhage    Plan: I talked to patient's daughter, Sam at length over the phone yesterday, and again today in her room.  I answered all of her questions to her satisfaction.  Patient and family would not like to proceed with colonoscopy at this time and therefore patient did not take the prep.  They are agreeable to the upper endoscopy only at this time  Given the drop in her hemoglobin, this is reasonable.  I did discuss that if  the EGD is negative, patient may need a colonoscopy if they are agreeable after the procedure.  They verbalized understanding of this and would like to proceed with EGD at this time  Continue PPI twice daily Continue serial CBCs and transfuse PRN Maintain 2 large-bore IV lines Avoid NSAIDs  I have discussed alternative options, risks & benefits,  which include, but are not limited to, bleeding, infection, perforation,respiratory complication & drug reaction.  The patient agrees with this plan & written consent will be obtained.      LOS: 3 days   Jenna Antigua, MD 12/18/2018, 12:13 PM

## 2018-12-18 NOTE — Transfer of Care (Signed)
Immediate Anesthesia Transfer of Care Note  Patient: [name]  Procedure(s) Performed: ESOPHAGOGASTRODUODENOSCOPY (EGD) (N/A )  Patient Location: Endoscopy Unit  Anesthesia Type:General  Level of Consciousness: awake, alert  and oriented  Airway & Oxygen Therapy: Patient Spontanous Breathing and Patient connected to face mask oxygen  Post-op Assessment: Report given to RN and Post -op Vital signs reviewed and stable  Post vital signs: Reviewed and stable  Last Vitals:  Vitals Value Taken Time  BP    Temp    Pulse    Resp    SpO2      Last Pain:  Vitals:   12/18/18 1143  TempSrc: Oral  PainSc:          Complications: No apparent anesthesia complications

## 2018-12-18 NOTE — Progress Notes (Signed)
Physical Therapy Treatment Patient Details Name: [name] MRN: [id number] DOB: Aug 14, 1933 Today's Date: 12/18/2018    History of Present Illness Pt is a 84 y/o F who presented with cough, SOB, swelling in LEs, dark tarry stools.  Admitted with diagnosis of upper GI bleed with acute on chronic COPD exacerbation along with pneumonia.  Pt's PMH includes bradycardia, COPD, osteoporosis, O2 at night, recent a-fib diagnosis, back surgery.      PT Comments    [name] made excellent progress with mobility, ambulating 180 ft with RW. SpO2 dropped to 87% following bed mobility and thus pt remained on 1L O2 for remainder of session.  SpO2 remains at or above 92% on 1L O2 with ambulation.  Encouraged pt to ambulate at least 3x/day with nursing staff.  Follow up recommendations remain appropriate at this time.      Follow Up Recommendations  Home health PT     Equipment Recommendations  None recommended by PT    Recommendations for Other Services       Precautions / Restrictions Precautions Precautions: Fall;Other (comment) Precaution Comments: O2 Restrictions Weight Bearing Restrictions: No    Mobility  Bed Mobility Overal bed mobility: Independent             General bed mobility comments: No physical assist or cues needed.  Pt performs independently.  SpO2 dropped to 87% following bed mobility and thus pt remained on 1L O2 for remainder of session.    Transfers Overall transfer level: Needs assistance Equipment used: Rolling walker (2 wheeled) Transfers: Sit to/from Stand Sit to Stand: Min guard         General transfer comment: Cues for proper hand placement as pt continues to initially keep Bil hand on RW with sit<>stand.  Had pt practice sit<>stand with proper technique x1 from bed and x1 from chair.   Ambulation/Gait Ambulation/Gait assistance: Min guard Gait Distance (Feet): 180 Feet Assistive device: Rolling walker (2 wheeled) Gait Pattern/deviations:  Decreased step length - right;Decreased step length - left;Trunk flexed     General Gait Details: Cues for pursed lip breathing.  SpO2 remains at or above 92% on 1L O2 with ambulation.     Stairs             Wheelchair Mobility    Modified Rankin (Stroke Patients Only)       Balance Overall balance assessment: Needs assistance Sitting-balance support: No upper extremity supported;Feet supported Sitting balance-Leahy Scale: Good     Standing balance support: Single extremity supported;During functional activity Standing balance-Leahy Scale: Poor Standing balance comment: Pt relies on at least 1UE support for static and dynamic activities                            Cognition Arousal/Alertness: Awake/alert Behavior During Therapy: WFL for tasks assessed/performed Overall Cognitive Status: Within Functional Limits for tasks assessed                                        Exercises General Exercises - Lower Extremity Ankle Circles/Pumps: AROM;Both;10 reps;Supine Straight Leg Raises: Both;10 reps;Supine Other Exercises Other Exercises: Encouraged pt to ambulate at least 3x/day with nursing staff    General Comments        Pertinent Vitals/Pain Pain Assessment: No/denies pain    Home Living  Prior Function            PT Goals (current goals can now be found in the care plan section) Acute Rehab PT Goals Patient Stated Goal: to go home PT Goal Formulation: With patient Time For Goal Achievement: 12/31/18 Potential to Achieve Goals: Good Progress towards PT goals: Progressing toward goals    Frequency    Min 2X/week      PT Plan Current plan remains appropriate    Co-evaluation              AM-PAC PT "6 Clicks" Mobility   Outcome Measure  Help needed turning from your back to your side while in a flat bed without using bedrails?: None Help needed moving from lying on your back to  sitting on the side of a flat bed without using bedrails?: None Help needed moving to and from a bed to a chair (including a wheelchair)?: A Little Help needed standing up from a chair using your arms (e.g., wheelchair or bedside chair)?: A Little Help needed to walk in hospital room?: A Little Help needed climbing 3-5 steps with a railing? : A Little 6 Click Score: 20    End of Session Equipment Utilized During Treatment: Gait belt;Oxygen Activity Tolerance: Patient tolerated treatment well Patient left: in chair;with call bell/phone within reach;with chair alarm set;Other (comment);with family/visitor present(with GI MD at bedside) Nurse Communication: Mobility status;Other (comment)(SpO2) PT Visit Diagnosis: Unsteadiness on feet (R26.81);Other abnormalities of gait and mobility (R26.89);Muscle weakness (generalized) (M62.81)     Time: 7543-6067 PT Time Calculation (min) (ACUTE ONLY): 29 min  Charges:  $Gait Training: 8-22 mins $Therapeutic Activity: 8-22 mins                     Collie Siad PT, DPT 12/18/2018, 12:12 PM

## 2018-12-18 NOTE — Care Management Important Message (Signed)
Copy of signed Medicare IM left with patient in room. 

## 2018-12-18 NOTE — Anesthesia Preprocedure Evaluation (Signed)
Anesthesia Evaluation  Patient identified by MRN, date of birth, ID band Patient awake    Reviewed: Allergy & Precautions, NPO status , Patient's Chart, lab work & pertinent test results  History of Anesthesia Complications Negative for: history of anesthetic complications  Airway Mallampati: III  TM Distance: >3 FB Neck ROM: Full    Dental  (+) Edentulous Upper, Edentulous Lower   Pulmonary neg sleep apnea, COPD (uses O2 at night),  oxygen dependent, former smoker,    breath sounds clear to auscultation- rhonchi (-) wheezing      Cardiovascular hypertension, Pt. on medications (-) CAD, (-) Past MI, (-) Cardiac Stents and (-) CABG + dysrhythmias Atrial Fibrillation  Rhythm:Regular Rate:Normal - Systolic murmurs and - Diastolic murmurs    Neuro/Psych neg Seizures Mild dementia  negative psych ROS   GI/Hepatic Neg liver ROS, GERD  ,  Endo/Other  negative endocrine ROSneg diabetes  Renal/GU negative Renal ROS     Musculoskeletal negative musculoskeletal ROS (+)   Abdominal (+) - obese,   Peds  Hematology negative hematology ROS (+)   Anesthesia Other Findings Past Medical History: No date: Bradycardia     Comment:  a. beta blocker-induced No date: Chronic hyponatremia No date: Chronic respiratory failure with hypoxia (HCC)     Comment:  a. secondary to COPD; b. on 2 L via [place] at baseline No date: Clavicular fracture     Comment:  History of  No date: COPD (chronic obstructive pulmonary disease) (HCC) No date: Gastroesophageal reflux disease No date: Generalized and unspecified atherosclerosis No date: HOH (hard of hearing) No date: Hyperlipidemia No date: Hypertension No date: Mitral regurgitation     Comment:  a. TTE 2015: LVSF nl, no RWMA, mild MR, trivial AI; b.               TTE 9/19 EF 55-60%, no RWMA, mild AI, LA normal in size,               RVSF normal, PASP 33 No date: Orthopnea No date:  Osteoporosis No date: PAF (paroxysmal atrial fibrillation) (HCC)     Comment:  a. diagnosed 9/19: CHADS2VASc at least 5 (HTN, age x 2,               vascular disease, female); b. Eliquis  No date: Tobacco abuse   Reproductive/Obstetrics                             Anesthesia Physical Anesthesia Plan  ASA: III  Anesthesia Plan: General   Post-op Pain Management:    Induction: Intravenous  PONV Risk Score and Plan: 2 and Propofol infusion  Airway Management Planned: Natural Airway  Additional Equipment:   Intra-op Plan:   Post-operative Plan:   Informed Consent: I have reviewed the patients History and Physical, chart, labs and discussed the procedure including the risks, benefits and alternatives for the proposed anesthesia with the patient or authorized representative who has indicated his/her understanding and acceptance.   Dental advisory given  Plan Discussed with: CRNA and Anesthesiologist  Anesthesia Plan Comments:         Anesthesia Quick Evaluation

## 2018-12-18 NOTE — Anesthesia Postprocedure Evaluation (Signed)
Anesthesia Post Note  Patient: [name]  Procedure(s) Performed: ESOPHAGOGASTRODUODENOSCOPY (EGD) (N/A )  Patient location during evaluation: Endoscopy Anesthesia Type: General Level of consciousness: awake and alert and oriented Pain management: pain level controlled Vital Signs Assessment: post-procedure vital signs reviewed and stable Respiratory status: spontaneous breathing, nonlabored ventilation and respiratory function stable Cardiovascular status: blood pressure returned to baseline and stable Postop Assessment: no signs of nausea or vomiting Anesthetic complications: no     Last Vitals:  Vitals:   12/18/18 1346 12/18/18 1422  BP: (!) 168/80 (!) 174/84  Pulse: 70 71  Resp: (!) 22 20  Temp:  37.1 C  SpO2: 100% 100%    Last Pain:  Vitals:   12/18/18 1422  TempSrc: Oral  PainSc:                  Neomia Herbel

## 2018-12-18 NOTE — Op Note (Signed)
[facility] Patient Name: [name] Procedure Date: 12/18/2018 12:58 PM MRN: [id number] Account #: [id number] Date of Birth: 1933/08/08 Admit Type: Inpatient Age: 84 Room: [facility] ENDO ROOM 4 Gender: Female Note Status: Finalized Procedure:            Upper GI endoscopy Indications:          Iron deficiency anemia Providers:            Giannie Soliday B. Bonna Gains MD, MD Referring MD:         [name] (Referring MD) Medicines:            Monitored Anesthesia Care Complications:        No immediate complications. Procedure:            Pre-Anesthesia Assessment:                       - Prior to the procedure, a History and Physical was                        performed, and patient medications, allergies and                        sensitivities were reviewed. The patient's tolerance of                        previous anesthesia was reviewed.                       - The risks and benefits of the procedure and the                        sedation options and risks were discussed with the                        patient. All questions were answered and informed                        consent was obtained.                       - Patient identification and proposed procedure were                        verified prior to the procedure by the physician, the                        nurse, the anesthesiologist, the anesthetist and the                        technician. The procedure was verified in the procedure                        room.                       - ASA Grade Assessment: II - A patient with mild                        systemic disease.                       After  obtaining informed consent, the endoscope was                        passed under direct vision. Throughout the procedure,                        the patient's blood pressure, pulse, and oxygen                        saturations were monitored continuously. The Endoscope                         was introduced through the mouth, and advanced to the                        third part of duodenum. The upper GI endoscopy was                        accomplished with ease. The patient tolerated the                        procedure well. Findings:      The examined esophagus was normal.      The entire examined stomach was normal.      The duodenal bulb, second portion of the duodenum, third portion of the       duodenum and examined duodenum were normal. Impression:           - Normal esophagus.                       - Normal stomach.                       - Normal duodenal bulb, second portion of the duodenum,                        third portion of the duodenum and examined duodenum.                       - No specimens collected.                       - No evidence of active or recent bleeding seen                        throughout the exam. Recommendation:       - Check Iron levels and administer IV iron if low                       - Pt and family would like to proceed with colonoscopy                        but would like to eat solid food today, and take the                        prep tomorrow instead of today. Therefore, colonoscopy                        to be done on Thursday with prep tomorrow.                       -  Solid food diet today. Clear liquid diet tomorrow                       - Continue present medications.                       - Patient has a contact number available for                        emergencies. The signs and symptoms of potential                        delayed complications were discussed with the patient.                        Return to normal activities tomorrow. Written discharge                        instructions were provided to the patient.                       - The findings and recommendations were discussed with                        the patient.                       - The findings and recommendations were discussed with                         the patient's family.                       - Continue Serial CBCs and transfuse PRN Procedure Code(s):    --- Professional ---                       [phone], Esophagogastroduodenoscopy, flexible, transoral;                        diagnostic, including collection of specimen(s) by                        brushing or washing, when performed (separate procedure) Diagnosis Code(s):    --- Professional ---                       D50.9, Iron deficiency anemia, unspecified CPT copyright 2018 American Medical Association. All rights reserved. The codes documented in this report are preliminary and upon coder review may  be revised to meet current compliance requirements.  Vonda Antigua, MD Margretta Sidle B. Bonna Gains MD, MD 12/18/2018 1:27:05 PM This report has been signed electronically. Number of Addenda: 0 Note Initiated On: 12/18/2018 12:58 PM Estimated Blood Loss: Estimated blood loss: none.      [facility]

## 2018-12-19 LAB — CBC
HCT: 25.7 % — ABNORMAL LOW (ref 36.0–46.0)
Hemoglobin: 7.8 g/dL — ABNORMAL LOW (ref 12.0–15.0)
MCH: 26.9 pg (ref 26.0–34.0)
MCHC: 30.4 g/dL (ref 30.0–36.0)
MCV: 88.6 fL (ref 80.0–100.0)
Platelets: 338 10*3/uL (ref 150–400)
RBC: 2.9 MIL/uL — ABNORMAL LOW (ref 3.87–5.11)
RDW: 15.3 % (ref 11.5–15.5)
WBC: 10.4 10*3/uL (ref 4.0–10.5)
nRBC: 0 % (ref 0.0–0.2)

## 2018-12-19 LAB — BASIC METABOLIC PANEL
Anion gap: 8 (ref 5–15)
BUN: 17 mg/dL (ref 8–23)
CO2: 24 mmol/L (ref 22–32)
CREATININE: 0.88 mg/dL (ref 0.44–1.00)
Calcium: 7.8 mg/dL — ABNORMAL LOW (ref 8.9–10.3)
Chloride: 107 mmol/L (ref 98–111)
GFR calc Af Amer: >60 mL/min (ref >60)
GFR calc non Af Amer: >60 mL/min (ref >60)
Glucose, Bld: 110 mg/dL — ABNORMAL HIGH (ref 70–99)
Potassium: 3.2 mmol/L — ABNORMAL LOW (ref 3.5–5.1)
Sodium: 139 mmol/L (ref 135–145)

## 2018-12-19 MED ORDER — POTASSIUM CHLORIDE CRYS ER 20 MEQ PO TBCR
40.0000 meq | EXTENDED_RELEASE_TABLET | Freq: Once | ORAL | Status: AC
  Administered 2018-12-19: 40 meq via ORAL
  Filled 2018-12-19: qty 2

## 2018-12-19 NOTE — Progress Notes (Signed)
[facility] NAME: [name]    MR#:  [id number]  DATE OF BIRTH:  Sep 29, 1933  SUBJECTIVE:  CHIEF COMPLAINT:   Chief Complaint  Patient presents with  . Shortness of Breath   Patient denies any complaints plan for colonoscopy per GI tomorrow  REVIEW OF SYSTEMS:    ROS  CONSTITUTIONAL: No documented fever. Has fatigue, weakness. No weight gain, no weight loss.  EYES: No blurry or double vision.  ENT: No tinnitus. No postnasal drip. No redness of the oropharynx.  RESPIRATORY: occasional cough, no wheeze, no hemoptysis. Has dyspnea.  CARDIOVASCULAR: No chest pain. No orthopnea. No palpitations. No syncope.  GASTROINTESTINAL: No nausea, no vomiting or diarrhea. No abdominal pain. No melena or hematochezia.  GENITOURINARY: No dysuria or hematuria.  ENDOCRINE: No polyuria or nocturia. No heat or cold intolerance.  HEMATOLOGY: No anemia. No bruising. No bleeding.  INTEGUMENTARY: No rashes. No lesions.  MUSCULOSKELETAL: No arthritis. No swelling. No gout.  NEUROLOGIC: No numbness, tingling, or ataxia. No seizure-type activity.  PSYCHIATRIC: No anxiety. No insomnia. No ADD.   DRUG ALLERGIES:   Allergies  Allergen Reactions  . Penicillins Other (See Comments)    Patient unsure of what reaction was. Has patient had a PCN reaction causing immediate rash, facial/tongue/throat swelling, SOB or lightheadedness with hypotension: unknown Has patient had a PCN reaction causing severe rash involving mucus membranes or skin necrosis: uknown Has patient had a PCN reaction that required hospitalization: unknown Has patient had a PCN reaction occurring within the last 10 years: No If all of the above answers are "NO", then may proceed with Cephalosporin use.   Francine Graven Other (See Comments)    Unknown reaction. Last used long time ago. Quit taking,.    VITALS:  Blood pressure (!) 168/64, pulse 80, temperature 97.6 F (36.4 C),  temperature source Oral, resp. rate 18, height 5\' 2"  (1.575 m), weight 58 kg, SpO2 95 %.  PHYSICAL EXAMINATION:   Physical Exam  GENERAL:  85 y.o.-year-old patient lying in the bed with no acute distress.  EYES: Pupils equal, round, reactive to light and accommodation. No scleral icterus. Extraocular muscles intact.  HEENT: Head atraumatic, normocephalic. Oropharynx and nasopharynx clear.  NECK:  Supple, no jugular venous distention. No thyroid enlargement, no tenderness.  LUNGS: Improved breath sounds bilaterally, scattered rales in both lungs. No use of accessory muscles of respiration.  CARDIOVASCULAR: S1, S2 normal. No murmurs, rubs, or gallops.  ABDOMEN: Soft, nontender, nondistended. Bowel sounds present. No organomegaly or mass.  EXTREMITIES: No cyanosis, mild edema b/l.    NEUROLOGIC: Cranial nerves II through XII are intact. No focal Motor or sensory deficits b/l.   PSYCHIATRIC: The patient is alert and oriented x 3.  SKIN: No obvious rash, lesion, or ulcer.   LABORATORY PANEL:   CBC Recent Labs  Lab 12/19/18 0552  WBC 10.4  HGB 7.8*  HCT 25.7*  PLT 338   ------------------------------------------------------------------------------------------------------------------ Chemistries  Recent Labs  Lab 12/19/18 0552  NA 139  K 3.2*  CL 107  CO2 24  GLUCOSE 110*  BUN 17  CREATININE 0.88  CALCIUM 7.8*   ------------------------------------------------------------------------------------------------------------------  Cardiac Enzymes No results for input(s): TROPONINI in the last 168 hours. ------------------------------------------------------------------------------------------------------------------  RADIOLOGY:  No results found.   ASSESSMENT AND PLAN:   85 year old female patient with history of COPD, proximal atrial fibrillation, hyperlipidemia, hypertension, osteoporosis currently under hospitalist service for shortness of breath and GI  bleed  -Streptococcal bacteremia Streptococcus  viridans noted Significance unknown On IV Rocephin antibiotic Stop antibiotic after 5 days  -Acute on chronic COPD exacerbation improving on oral steroids Continue Rocephin and Zithromax antibiotics Continue nebulization treatments Oxygen  via nasal cannula Flu test negative  -Gastrointestinal bleeding  Status post evaluation by GI consultant S/p prbc transfusion Had upper endoscopy yesterday endoscopy was a normal study Plan for colonoscopy on Thursday   -Paroxysmal atrial fibrillation Hold Eliquis in view of GI bleed Continue rate control  -Pneumonia resolving Currently on Rocephin and Zithromax antibiotics  -DVT prophylaxis sequential compression device to lower extremities  All the records are reviewed and case discussed with Care Management/Social Worker. Management plans discussed with the patient, family and they are in agreement.  CODE STATUS:Full code  DVT Prophylaxis: SCDs  TOTAL TIME TAKING CARE OF THIS PATIENT: 35 minutes.   POSSIBLE D/C IN 2 to 3 DAYS, DEPENDING ON CLINICAL CONDITION.  Dustin Flock M.D on 12/19/2018 at 3:23 PM  Between 7am to 6pm - Pager - [phone]  After 6pm go to [email or web address] - password [email or web address] [facility]  Office  [phone]  CC: Primary care physician; [name], MD  Note: This dictation was prepared with Dragon dictation along with smaller phrase technology. Any transcriptional errors that result from this process are unintentional.

## 2018-12-20 ENCOUNTER — Encounter
Admission: EM | Disposition: A | Discharge status: Home / Self Care | Payer: Self-pay | Source: Home / Self Care | Attending: Internal Medicine

## 2018-12-20 ENCOUNTER — Inpatient Hospital Stay: Payer: Medicare HMO | Admitting: Anesthesiology

## 2018-12-20 ENCOUNTER — Encounter: Payer: Self-pay | Admitting: Gastroenterology

## 2018-12-20 DIAGNOSIS — K5730 Diverticulosis of large intestine without perforation or abscess without bleeding: Secondary | ICD-10-CM

## 2018-12-20 DIAGNOSIS — D125 Benign neoplasm of sigmoid colon: Secondary | ICD-10-CM

## 2018-12-20 DIAGNOSIS — K635 Polyp of colon: Secondary | ICD-10-CM

## 2018-12-20 DIAGNOSIS — D490 Neoplasm of unspecified behavior of digestive system: Secondary | ICD-10-CM

## 2018-12-20 DIAGNOSIS — D120 Benign neoplasm of cecum: Secondary | ICD-10-CM

## 2018-12-20 HISTORY — PX: COLONOSCOPY WITH PROPOFOL: SHX5780

## 2018-12-20 LAB — BASIC METABOLIC PANEL
Anion gap: 4 — ABNORMAL LOW (ref 5–15)
BUN: 10 mg/dL (ref 8–23)
CALCIUM: 7.4 mg/dL — AB (ref 8.9–10.3)
CO2: 28 mmol/L (ref 22–32)
Chloride: 102 mmol/L (ref 98–111)
Creatinine, Ser: 0.58 mg/dL (ref 0.44–1.00)
GFR calc Af Amer: >60 mL/min (ref >60)
GFR calc non Af Amer: >60 mL/min (ref >60)
Glucose, Bld: 94 mg/dL (ref 70–99)
Potassium: 2.8 mmol/L — ABNORMAL LOW (ref 3.5–5.1)
Sodium: 134 mmol/L — ABNORMAL LOW (ref 135–145)

## 2018-12-20 LAB — CBC
HCT: 26.3 % — ABNORMAL LOW (ref 36.0–46.0)
Hemoglobin: 7.9 g/dL — ABNORMAL LOW (ref 12.0–15.0)
MCH: 26.3 pg (ref 26.0–34.0)
MCHC: 30 g/dL (ref 30.0–36.0)
MCV: 87.7 fL (ref 80.0–100.0)
Platelets: 333 10*3/uL (ref 150–400)
RBC: 3 MIL/uL — ABNORMAL LOW (ref 3.87–5.11)
RDW: 15.3 % (ref 11.5–15.5)
WBC: 8.4 10*3/uL (ref 4.0–10.5)
nRBC: 0 % (ref 0.0–0.2)

## 2018-12-20 LAB — CULTURE, BLOOD (ROUTINE X 2)
Culture: NO GROWTH
SPECIAL REQUESTS: ADEQUATE

## 2018-12-20 LAB — POTASSIUM: Potassium: 3 mmol/L — ABNORMAL LOW (ref 3.5–5.1)

## 2018-12-20 SURGERY — COLONOSCOPY WITH PROPOFOL
Anesthesia: General

## 2018-12-20 MED ORDER — ADULT MULTIVITAMIN W/MINERALS CH
1.0000 | ORAL_TABLET | Freq: Every day | ORAL | Status: DC
  Administered 2018-12-21: 1 via ORAL
  Filled 2018-12-20: qty 1

## 2018-12-20 MED ORDER — PROPOFOL 10 MG/ML IV BOLUS
INTRAVENOUS | Status: AC
  Filled 2018-12-20: qty 20

## 2018-12-20 MED ORDER — POTASSIUM CHLORIDE 10 MEQ/100ML IV SOLN: 10.0000 meq | INTRAVENOUS | Status: AC

## 2018-12-20 MED ORDER — LIDOCAINE HCL (CARDIAC) PF 100 MG/5ML IV SOSY
PREFILLED_SYRINGE | INTRAVENOUS | Status: DC | PRN
  Administered 2018-12-20: 50 mg via INTRAVENOUS

## 2018-12-20 MED ORDER — IPRATROPIUM-ALBUTEROL 0.5-2.5 (3) MG/3ML IN SOLN
RESPIRATORY_TRACT | Status: AC
  Administered 2018-12-20: 3 mL via RESPIRATORY_TRACT
  Filled 2018-12-20: qty 3

## 2018-12-20 MED ORDER — AMLODIPINE BESYLATE 5 MG PO TABS
5.0000 mg | ORAL_TABLET | Freq: Once | ORAL | Status: AC
  Administered 2018-12-20: 5 mg via ORAL
  Filled 2018-12-20: qty 1

## 2018-12-20 MED ORDER — SPOT INK MARKER SYRINGE KIT
PACK | SUBMUCOSAL | Status: DC | PRN
  Administered 2018-12-20: 4 mL via SUBMUCOSAL

## 2018-12-20 MED ORDER — SODIUM CHLORIDE (PF) 0.9 % IJ SOLN
INTRAMUSCULAR | Status: DC | PRN
  Administered 2018-12-20: 9 mL

## 2018-12-20 MED ORDER — IPRATROPIUM-ALBUTEROL 0.5-2.5 (3) MG/3ML IN SOLN: 3.0000 mL | RESPIRATORY_TRACT | Status: DC | PRN

## 2018-12-20 MED ORDER — PROPOFOL 500 MG/50ML IV EMUL
INTRAVENOUS | Status: DC | PRN
  Administered 2018-12-20: 75 ug/kg/min via INTRAVENOUS

## 2018-12-20 MED ORDER — ENSURE ENLIVE PO LIQD: 237.0000 mL | Freq: Two times a day (BID) | ORAL | Status: DC

## 2018-12-20 MED ORDER — PROPOFOL 10 MG/ML IV BOLUS
INTRAVENOUS | Status: DC | PRN
  Administered 2018-12-20: 20 mg via INTRAVENOUS
  Administered 2018-12-20: 40 mg via INTRAVENOUS

## 2018-12-20 NOTE — Anesthesia Post-op Follow-up Note (Signed)
Anesthesia QCDR form completed.        

## 2018-12-20 NOTE — Anesthesia Procedure Notes (Signed)
Performed by: Sacha Topor, CRNA Pre-anesthesia Checklist: Patient identified, Emergency Drugs available, Suction available, Patient being monitored and Timeout performed Patient Re-evaluated:Patient Re-evaluated prior to induction Oxygen Delivery Method: Nasal cannula Induction Type: IV induction       

## 2018-12-20 NOTE — Anesthesia Preprocedure Evaluation (Addendum)
Anesthesia Evaluation  Patient identified by MRN, date of birth, ID band Patient awake    Reviewed: Allergy & Precautions, H&P , NPO status , Patient's Chart, lab work & pertinent test results  Airway Mallampati: III       Dental  (+) Edentulous Lower, Upper Dentures   Pulmonary pneumonia, COPD,  oxygen dependent, former smoker,  Being treated for PNA/COPD exacerbation with steroids and antibiotics, now in 2L [place]    + wheezing      Cardiovascular hypertension, + Orthopnea and + DOE  + Valvular Problems/Murmurs MR   Echo 09/06/18: - Left ventricle: The cavity size was normal. Systolic function was   normal. The estimated ejection fraction was in the range of 55%   to 60%. Wall motion was normal; there were no regional wall   motion abnormalities. The study is not technically sufficient to   allow evaluation of LV diastolic function. - Aortic valve: There was mild regurgitation. - Left atrium: The atrium was normal in size. - Right ventricle: Systolic function was normal. - Pulmonary arteries: Systolic pressure was within the normal   range. PA peak pressure: 33 mm Hg (S).   Neuro/Psych PSYCHIATRIC DISORDERS negative neurological ROS     GI/Hepatic Neg liver ROS, GERD  ,  Endo/Other  negative endocrine ROS  Renal/GU negative Renal ROS  negative genitourinary   Musculoskeletal   Abdominal   Peds  Hematology  (+) Blood dyscrasia, anemia ,   Anesthesia Other Findings Past Medical History: No date: Bradycardia     Comment:  a. beta blocker-induced No date: Chronic hyponatremia No date: Chronic respiratory failure with hypoxia (HCC)     Comment:  a. secondary to COPD; b. on 2 L via [place] at baseline No date: Clavicular fracture     Comment:  History of  No date: COPD (chronic obstructive pulmonary disease) (HCC) No date: Gastroesophageal reflux disease No date: Generalized and unspecified atherosclerosis No date: HOH  (hard of hearing) No date: Hyperlipidemia No date: Hypertension No date: Mitral regurgitation     Comment:  a. TTE 2015: LVSF nl, no RWMA, mild MR, trivial AI; b.               TTE 9/19 EF 55-60%, no RWMA, mild AI, LA normal in size,               RVSF normal, PASP 33 No date: Orthopnea No date: Osteoporosis No date: PAF (paroxysmal atrial fibrillation) (HCC)     Comment:  a. diagnosed 9/19: CHADS2VASc at least 5 (HTN, age x 2,               vascular disease, female); b. Eliquis  No date: Tobacco abuse  Past Surgical History: No date: ABDOMINAL HYSTERECTOMY 20+ years ago: APPENDECTOMY 2 years ago: BACK SURGERY     Comment:  x2 February 2015: Cardiac Event Monitor     Comment:  Sinus rhythm sinus bradycardia rates of 46-80 beats a               minute. No other arrhythmias; occasional PACs with some               being blocked., Occasional first-degree AV block. 01/19/2017: CATARACT EXTRACTION W/PHACO; Left     Comment:  Procedure: CATARACT EXTRACTION PHACO AND INTRAOCULAR               LENS PLACEMENT (IOC);  Surgeon: Eulogio Bear, MD;  Location: [facility] ORS;  Service: Ophthalmology;  Laterality:              Left;  us00:58.3 ap%14.8 cde8.63 lot # [id number] 02/16/2017: CATARACT EXTRACTION W/PHACO; Right     Comment:  Procedure: CATARACT EXTRACTION PHACO AND INTRAOCULAR               LENS PLACEMENT (IOC);  Surgeon: Eulogio Bear, MD;                Location: [facility] ORS;  Service: Ophthalmology;  Laterality:              Right;  [place] 1:10.9 AP% 11.0 CDE 7.84 FLUID PACK LOT #               [id number] 01/21/2014: TRANSTHORACIC ECHOCARDIOGRAM     Comment:  Normal LV size and function. No regional wall motion               abnormalities. Mild aortic sclerosis with trivial               regurgitation. 20+ years ago: VESICOVAGINAL FISTULA CLOSURE W/ TAH  BMI    Body Mass Index:  23.39 kg/m      Reproductive/Obstetrics negative OB ROS                           Anesthesia Physical Anesthesia Plan  ASA: IV  Anesthesia Plan: General   Post-op Pain Management:    Induction:   PONV Risk Score and Plan: Propofol infusion and TIVA  Airway Management Planned: Natural Airway and Nasal Cannula  Additional Equipment:   Intra-op Plan:   Post-operative Plan:   Informed Consent: I have reviewed the patients History and Physical, chart, labs and discussed the procedure including the risks, benefits and alternatives for the proposed anesthesia with the patient or authorized representative who has indicated his/her understanding and acceptance.   Dental Advisory Given  Plan Discussed with: Anesthesiologist, CRNA and Surgeon  Anesthesia Plan Comments:         Anesthesia Quick Evaluation

## 2018-12-20 NOTE — Op Note (Signed)
[facility] Gastroenterology Patient Name: [name] Procedure Date: 12/20/2018 11:51 AM MRN: [id number] Account #: [id number] Date of Birth: February 22, 1933 Admit Type: Inpatient Age: 85 Room: [facility] ENDO ROOM 4 Gender: Female Note Status: Finalized Procedure:            Colonoscopy Indications:          Iron deficiency anemia Providers:            Tiras Bianchini B. Bonna Gains MD, MD Referring MD:         [name] (Referring MD) Medicines:            Monitored Anesthesia Care Complications:        No immediate complications. Procedure:            Pre-Anesthesia Assessment:                       - ASA Grade Assessment: III - A patient with severe                        systemic disease.                       - Prior to the procedure, a History and Physical was                        performed, and patient medications, allergies and                        sensitivities were reviewed. The patient's tolerance of                        previous anesthesia was reviewed.                       - The risks and benefits of the procedure and the                        sedation options and risks were discussed with the                        patient. All questions were answered and informed                        consent was obtained.                       - Patient identification and proposed procedure were                        verified prior to the procedure by the physician, the                        nurse, the anesthesiologist, the anesthetist and the                        technician. The procedure was verified in the procedure                        room.                       After obtaining informed  consent, the colonoscope was                        passed under direct vision. Throughout the procedure,                        the patient's blood pressure, pulse, and oxygen                        saturations were monitored continuously. The   Colonoscope was introduced through the anus and                        advanced to the the cecum, identified by appendiceal                        orifice and ileocecal valve. The colonoscopy was                        performed with ease. The patient tolerated the                        procedure well. The quality of the bowel preparation                        was good. Findings:      The perianal and digital rectal examinations were normal.      A 10 mm polyp was found in the cecum. The polyp was sessile. Area was       successfully injected with saline for lesion assessment, and this       injection appeared to lift the lesion adequately. The polyp was removed       with a hot snare. Resection and retrieval were complete.      Multiple diverticula were found in the sigmoid colon.      A non-obstructing large mass was found in the ascending colon. The mass       was partially circumferential (involving one-third of the lumen       circumference). No bleeding was present. Area was tattooed with an       injection of Spot (carbon black). The tattoo was placed on fold distal       and one fold proximal to the lesion. The lesion was at the hepatic       flexure. Biopsies were taken with a cold forceps for histology.      A 6 mm polyp was found in the sigmoid colon. The polyp was sessile. The       polyp was removed with a hot snare. Resection and retrieval were       complete.      Two pedunculated polyps were found in the sigmoid colon. The polyps were       10 to 11 mm in size. These polyps were removed with a hot snare.       Resection and retrieval were complete. To prevent bleeding after the       polypectomy, one hemostatic clip was successfully placed. There was no       bleeding at the end of the procedure.      The exam was otherwise without abnormality.      The rectum, sigmoid colon, descending colon, transverse colon, ascending       colon  and cecum appeared normal.       Non-bleeding internal hemorrhoids were found during retroflexion. Impression:           - One 10 mm polyp in the cecum, removed with a hot                        snare. Resected and retrieved. Injected.                       - Diverticulosis in the sigmoid colon.                       - Likely malignant tumor in the ascending colon.                        Tattooed. Biopsied.                       - One 6 mm polyp in the sigmoid colon, removed with a                        hot snare. Resected and retrieved.                       - Two 10 to 11 mm polyps in the sigmoid colon, removed                        with a hot snare. Resected and retrieved. Clip was                        placed.                       - The examination was otherwise normal.                       - The rectum, sigmoid colon, descending colon,                        transverse colon, ascending colon and cecum are normal.                       - Non-bleeding internal hemorrhoids. Recommendation:       - Refer to a surgeon today.                       - Refer to an oncologist today.                       - Advance diet as tolerated.                       - Continue present medications.                       - Await pathology results.                       - The findings and recommendations were discussed with                        the patient.                       -  The findings and recommendations were discussed with                        the patient's family. Procedure Code(s):    --- Professional ---                       [phone], Colonoscopy, flexible; with removal of tumor(s),                        polyp(s), or other lesion(s) by snare technique                       [id number], Colonoscopy, flexible; with directed submucosal                        injection(s), any substance                       48250, 97, Colonoscopy, flexible; with biopsy, single                        or multiple Diagnosis Code(s):    ---  Professional ---                       D12.0, Benign neoplasm of cecum                       D12.5, Benign neoplasm of sigmoid colon                       D49.0, Neoplasm of unspecified behavior of digestive                        system                       D50.9, Iron deficiency anemia, unspecified                       K57.30, Diverticulosis of large intestine without                        perforation or abscess without bleeding CPT copyright 2018 American Medical Association. All rights reserved. The codes documented in this report are preliminary and upon coder review may  be revised to meet current compliance requirements.  Vonda Antigua, MD Margretta Sidle B. Bonna Gains MD, MD 12/20/2018 1:31:33 PM This report has been signed electronically. Number of Addenda: 0 Note Initiated On: 12/20/2018 11:51 AM Scope Withdrawal Time: 0 hours 26 minutes 49 seconds  Total Procedure Duration: 0 hours 55 minutes 3 seconds  Estimated Blood Loss: Estimated blood loss: none.      [facility]

## 2018-12-20 NOTE — Progress Notes (Signed)
[facility] [place] NAME: [name]    MR#:  [id number]  DATE OF BIRTH:  1933/02/24  SUBJECTIVE:  CHIEF COMPLAINT:   Chief Complaint  Patient presents with  . Shortness of Breath   Patient had a colonoscopy which shows a possible tubular.  REVIEW OF SYSTEMS:    ROS  CONSTITUTIONAL: No documented fever. Has fatigue, weakness. No weight gain, no weight loss.  EYES: No blurry or double vision.  ENT: No tinnitus. No postnasal drip. No redness of the oropharynx.  RESPIRATORY: occasional cough, no wheeze, no hemoptysis. Has dyspnea.  CARDIOVASCULAR: No chest pain. No orthopnea. No palpitations. No syncope.  GASTROINTESTINAL: No nausea, no vomiting or diarrhea. No abdominal pain. No melena or hematochezia.  GENITOURINARY: No dysuria or hematuria.  ENDOCRINE: No polyuria or nocturia. No heat or cold intolerance.  HEMATOLOGY: No anemia. No bruising. No bleeding.  INTEGUMENTARY: No rashes. No lesions.  MUSCULOSKELETAL: No arthritis. No swelling. No gout.  NEUROLOGIC: No numbness, tingling, or ataxia. No seizure-type activity.  PSYCHIATRIC: No anxiety. No insomnia. No ADD.   DRUG ALLERGIES:   Allergies  Allergen Reactions  . Penicillins Other (See Comments)    Patient unsure of what reaction was. Has patient had a PCN reaction causing immediate rash, facial/tongue/throat swelling, SOB or lightheadedness with hypotension: unknown Has patient had a PCN reaction causing severe rash involving mucus membranes or skin necrosis: uknown Has patient had a PCN reaction that required hospitalization: unknown Has patient had a PCN reaction occurring within the last 10 years: No If all of the above answers are "NO", then may proceed with Cephalosporin use.   Francine Graven Other (See Comments)    Unknown reaction. Last used long time ago. Quit taking,.    VITALS:  Blood pressure (!) 159/77, pulse 73, temperature 98.4 F (36.9 C), temperature  source Oral, resp. rate 14, height 5\' 2"  (1.575 m), weight 58 kg, SpO2 92 %.  PHYSICAL EXAMINATION:   Physical Exam  GENERAL:  85 y.o.-year-old patient lying in the bed with no acute distress.  EYES: Pupils equal, round, reactive to light and accommodation. No scleral icterus. Extraocular muscles intact.  HEENT: Head atraumatic, normocephalic. Oropharynx and nasopharynx clear.  NECK:  Supple, no jugular venous distention. No thyroid enlargement, no tenderness.  LUNGS: Improved breath sounds bilaterally, scattered rales in both lungs. No use of accessory muscles of respiration.  CARDIOVASCULAR: S1, S2 normal. No murmurs, rubs, or gallops.  ABDOMEN: Soft, nontender, nondistended. Bowel sounds present. No organomegaly or mass.  EXTREMITIES: No cyanosis, mild edema b/l.    NEUROLOGIC: Cranial nerves II through XII are intact. No focal Motor or sensory deficits b/l.   PSYCHIATRIC: The patient is alert and oriented x 3.  SKIN: No obvious rash, lesion, or ulcer.   LABORATORY PANEL:   CBC Recent Labs  Lab 12/20/18 0524  WBC 8.4  HGB 7.9*  HCT 26.3*  PLT 333   ------------------------------------------------------------------------------------------------------------------ Chemistries  Recent Labs  Lab 12/20/18 0524 12/20/18 1044  NA 134*  --   K 2.8* 3.0*  CL 102  --   CO2 28  --   GLUCOSE 94  --   BUN 10  --   CREATININE 0.58  --   CALCIUM 7.4*  --    ------------------------------------------------------------------------------------------------------------------  Cardiac Enzymes No results for input(s): TROPONINI in the last 168 hours. ------------------------------------------------------------------------------------------------------------------  RADIOLOGY:  No results found.   ASSESSMENT AND PLAN:   85 year old female patient with history of  COPD, proximal atrial fibrillation, hyperlipidemia, hypertension, osteoporosis currently under hospitalist service for  shortness of breath and GI bleed  -Streptococcal bacteremia Streptococcus viridans noted On IV Rocephin antibiotic Last day of antibiotic day  -Acute on chronic COPD exacerbation improving on oral steroids Will finish antibiotics today Continue nebulization treatments Oxygen  via nasal cannula Flu test negative  -Gastrointestinal bleeding  Status post evaluation by GI consultant S/p prbc transfusion Had upper endoscopy showed no abnormality  Colonoscopy showed colon mass , patient will need surgery consult as well as oncology evaluation   -Paroxysmal atrial fibrillation Hold Eliquis in view of GI bleed Continue rate control  -Pneumonia resolving Status post treatment with antibiotics  -DVT prophylaxis sequential compression device to lower extremities  All the records are reviewed and case discussed with Care Management/Social Worker. Management plans discussed with the patient, family and they are in agreement.  CODE STATUS:Full code  DVT Prophylaxis: SCDs  TOTAL TIME TAKING CARE OF THIS PATIENT: 35 minutes.   POSSIBLE D/C IN 2 to 3 DAYS, DEPENDING ON CLINICAL CONDITION.  Dustin Flock M.D on 12/20/2018 at 3:52 PM  Between 7am to 6pm - Pager - [phone]  After 6pm go to [email or web address] - password [email or web address] [facility]  Office  [phone]  CC: Primary care physician; [name], MD  Note: This dictation was prepared with Dragon dictation along with smaller phrase technology. Any transcriptional errors that result from this process are unintentional.

## 2018-12-20 NOTE — Transfer of Care (Signed)
Immediate Anesthesia Transfer of Care Note  Patient: [name]  Procedure(s) Performed: COLONOSCOPY WITH PROPOFOL (N/A )  Patient Location: PACU  Anesthesia Type:General  Level of Consciousness: awake, alert  and responds to stimulation  Airway & Oxygen Therapy: Patient Spontanous Breathing and Patient connected to nasal cannula oxygen  Post-op Assessment: Report given to RN and Post -op Vital signs reviewed and stable  Post vital signs: Reviewed and stable  Last Vitals:  Vitals Value Taken Time  BP 108/55 12/20/2018  1:25 PM  Temp 36.2 C 12/20/2018  1:24 PM  Pulse 81 12/20/2018  1:25 PM  Resp 13 12/20/2018  1:25 PM  SpO2 94 % 12/20/2018  1:25 PM  Vitals shown include unvalidated device data.  Last Pain:  Vitals:   12/20/18 1324  TempSrc: Tympanic  PainSc: Asleep         Complications: No apparent anesthesia complications

## 2018-12-20 NOTE — Progress Notes (Signed)
I talked to the patient's daughter, [name], at length today over the phone and discussed the mass and polyp seen during colonoscopy, potential diagnoses of cancer pending biopsy results.  I explained that the next steps would be surgery and oncology referral, and to await biopsies to confirm if this is cancer or not.  She verbalized understanding of the diagnosis and states she will discuss next steps with her mother to help make decisions.  All her questions were answered to her satisfaction.

## 2018-12-21 ENCOUNTER — Other Ambulatory Visit: Payer: Self-pay | Admitting: Anatomic Pathology & Clinical Pathology

## 2018-12-21 ENCOUNTER — Encounter: Payer: Self-pay | Admitting: Gastroenterology

## 2018-12-21 LAB — SURGICAL PATHOLOGY

## 2018-12-21 MED ORDER — IPRATROPIUM-ALBUTEROL 0.5-2.5 (3) MG/3ML IN SOLN: 3.0000 mL | Freq: Once | RESPIRATORY_TRACT | Status: DC

## 2018-12-21 NOTE — Progress Notes (Signed)
Patient found to have a large colon mass which is likely cancer and the cause of the patient's anemia. The patienthas had a surgery and oncology referral placed. Nothing further to do from a GI point of view. I will sign off.  Please call if any further GI concerns or questions.  We would like to thank you for the opportunity to participate in the care of [place].

## 2018-12-21 NOTE — Progress Notes (Signed)
Pt's daughter asking when pt is going home. Educated that MD will be making rounds and will discuss it with her. Pt's daughter asking "why can't we get the doctor here now?". Again educated pt and family that MD will be up to see pt as he is able. Notified MD via secure chat with pt and family's request for discharge. Awaiting response.

## 2018-12-21 NOTE — Discharge Summary (Signed)
[facility] [place] [facility], 85 y.o., DOB 1932/12/21, MRN [id number]. Admission date: 12/15/2018 Discharge Date 12/21/2018 Primary MD [name], MD Admitting Physician Dustin Flock, MD  Admission Diagnosis  SOB (shortness of breath) [R06.02] Gastrointestinal hemorrhage, unspecified gastrointestinal hemorrhage type [K92.2] Community acquired pneumonia of right upper lobe of lung ([place]) [J18.1]  Discharge Diagnosis   Active Problems: Lower GI bleed due to colon mass Iron deficiency anemia Benign neoplasm of cecum Polyp of sigmoid colon Diverticulosis of large intestine without diverticulitis Streptococcal bacteremia status post treatment Acute on chronic COPD exacerbation Paroxysmal atrial fibrillation Pneumonia        Hospital Course  [name]  is a 85 y.o. female with a known history of chronic respiratory failure on oxygen mostly at nighttime, history of pneumonia, COPD, recent diagnosis of paroxysmal atrial fibrillation who was started on Eliquis who is brought to the hospital because she was having cough shortness of breath.  Daughter also noticed that her legs were swollen.  Patient was admitted for acute on chronic COPD exacerbation as well as GI bleed.  Patient was treated for acute on chronic COPD exasperation as well as pneumonia.  With improvement in her respiratory symptoms.  Patient also had dark tarry stools therefore was seen by GI.  Further evaluation with colonoscopy showed that she had a ascending colon mass.  Patient was recommended to stay in the hospital be evaluated by surgery and oncology.  However today patient and daughter became very upset insisting on leaving the hospital.  Daughter stated that how we are consulting oncology when we do not have pathology results back.  I told her that this is standard procedure.  Patient and daughter kept insisting on going home.  I will arrange for outpatient follow-up with  oncology and surgery.           Consults  GI  Significant Tests:  See full reports for all details     Dg Chest 2 View  Result Date: 12/15/2018 CLINICAL DATA:  COPD with increasing dyspnea for the past few days. EXAM: CHEST - 2 VIEW COMPARISON:  09/05/2018 FINDINGS: Emphysematous hyperinflation of the lungs. Normal heart size. Tortuous atherosclerotic aorta without aneurysm. Partial clearing of airspace opacities in the right upper lobe. Scarring is suggested at the left lung apex unchanged. No dominant mass or pulmonary consolidations. Subsegmental atelectasis is seen at the left lung base. No acute nor suspicious osseous abnormalities. IMPRESSION: Emphysematous hyperinflation of the lungs. Partial clearing of right upper lobe pneumonia. No new pulmonary consolidation or CHF. Electronically Signed   By: Ashley Royalty M.D.   On: 12/15/2018 18:50       Today   Subjective:   [name] patient very anxious to go home  Objective:   Blood pressure (!) 157/75, pulse 69, temperature 97.8 F (36.6 C), temperature source Oral, resp. rate 16, height 5\' 2"  (1.575 m), weight 58 kg, SpO2 100 %.  .  Intake/Output Summary (Last 24 hours) at 12/21/2018 1433 Last data filed at 12/21/2018 0310 Gross per 24 hour  Intake 0 ml  Output 700 ml  Net -700 ml    Exam VITAL SIGNS: Blood pressure (!) 157/75, pulse 69, temperature 97.8 F (36.6 C), temperature source Oral, resp. rate 16, height 5\' 2"  (1.575 m), weight 58 kg, SpO2 100 %.  GENERAL:  85 y.o.-year-old patient lying in the bed with no acute distress.  EYES: Pupils equal, round, reactive to light and accommodation. No scleral icterus.  Extraocular muscles intact.  HEENT: Head atraumatic, normocephalic. Oropharynx and nasopharynx clear.  NECK:  Supple, no jugular venous distention. No thyroid enlargement, no tenderness.  LUNGS: Normal breath sounds bilaterally, no wheezing, rales,rhonchi or crepitation. No use of accessory muscles of  respiration.  CARDIOVASCULAR: S1, S2 normal. No murmurs, rubs, or gallops.  ABDOMEN: Soft, nontender, nondistended. Bowel sounds present. No organomegaly or mass.  EXTREMITIES: No pedal edema, cyanosis, or clubbing.  NEUROLOGIC: Cranial nerves II through XII are intact. Muscle strength 5/5 in all extremities. Sensation intact. Gait not checked.  PSYCHIATRIC: The patient is alert and oriented x 3.  SKIN: No obvious rash, lesion, or ulcer.   Data Review     CBC w Diff:  Lab Results  Component Value Date   WBC 8.4 12/20/2018   HGB 7.9 (L) 12/20/2018   HCT 26.3 (L) 12/20/2018   PLT 333 12/20/2018   LYMPHOPCT 22 12/15/2018   MONOPCT 11 12/15/2018   EOSPCT 2 12/15/2018   BASOPCT 0 12/15/2018   CMP:  Lab Results  Component Value Date   NA 134 (L) 12/20/2018   K 3.0 (L) 12/20/2018   CL 102 12/20/2018   CO2 28 12/20/2018   BUN 10 12/20/2018   CREATININE 0.58 12/20/2018   PROT 6.9 01/24/2017   ALBUMIN 4.0 01/24/2017   BILITOT 1.0 01/24/2017   ALKPHOS 44 01/24/2017   AST 31 01/24/2017   ALT 12 (L) 01/24/2017  .  Micro Results Recent Results (from the past 240 hour(s))  Blood culture (routine x 2)     Status: Abnormal   Collection Time: 12/15/18  8:30 PM  Result Value Ref Range Status   Specimen Description   Final    BLOOD LEFT ANTECUBITAL Performed at [facility], [address], [place], [place] [address]    Special Requests   Final    BOTTLES DRAWN AEROBIC AND ANAEROBIC Blood Culture results may not be optimal due to an excessive volume of blood received in culture bottles Performed at [facility], [address] [place], [place] [address]    Culture  Setup Time   Final    GRAM POSITIVE COCCI AEROBIC BOTTLE ONLY CRITICAL RESULT CALLED TO, READ BACK BY AND VERIFIED WITH: JASON ROBBINS AT 7035 12/16/18.PMF    Culture (A)  Final    VIRIDANS STREPTOCOCCUS THE SIGNIFICANCE OF ISOLATING THIS ORGANISM FROM A SINGLE SET OF BLOOD CULTURES WHEN  MULTIPLE SETS ARE DRAWN IS UNCERTAIN. PLEASE NOTIFY THE MICROBIOLOGY DEPARTMENT WITHIN ONE WEEK IF SPECIATION AND SENSITIVITIES ARE REQUIRED. Performed at [facility], [address] [place], [place] [address]    Report Status 12/18/2018 FINAL  Final  Blood Culture ID Panel (Reflexed)     Status: Abnormal   Collection Time: 12/15/18  8:30 PM  Result Value Ref Range Status   Enterococcus species NOT DETECTED NOT DETECTED Final   Listeria monocytogenes NOT DETECTED NOT DETECTED Final   Staphylococcus species NOT DETECTED NOT DETECTED Final   Staphylococcus aureus (BCID) NOT DETECTED NOT DETECTED Final   Streptococcus species DETECTED (A) NOT DETECTED Final    Comment: Not Enterococcus species, Streptococcus agalactiae, Streptococcus pyogenes, or Streptococcus pneumoniae. CRITICAL RESULT CALLED TO, READ BACK BY AND VERIFIED WITH: JASON ROBBINS AT 1503 12/16/18.PMF    Streptococcus agalactiae NOT DETECTED NOT DETECTED Final   Streptococcus pneumoniae NOT DETECTED NOT DETECTED Final   Streptococcus pyogenes NOT DETECTED NOT DETECTED Final   Acinetobacter baumannii NOT DETECTED NOT DETECTED Final   Enterobacteriaceae species NOT  DETECTED NOT DETECTED Final   Enterobacter cloacae complex NOT DETECTED NOT DETECTED Final   Escherichia coli NOT DETECTED NOT DETECTED Final   Klebsiella oxytoca NOT DETECTED NOT DETECTED Final   Klebsiella pneumoniae NOT DETECTED NOT DETECTED Final   Proteus species NOT DETECTED NOT DETECTED Final   Serratia marcescens NOT DETECTED NOT DETECTED Final   Haemophilus influenzae NOT DETECTED NOT DETECTED Final   Neisseria meningitidis NOT DETECTED NOT DETECTED Final   Pseudomonas aeruginosa NOT DETECTED NOT DETECTED Final   Candida albicans NOT DETECTED NOT DETECTED Final   Candida glabrata NOT DETECTED NOT DETECTED Final   Candida krusei NOT DETECTED NOT DETECTED Final   Candida parapsilosis NOT DETECTED NOT DETECTED Final   Candida tropicalis NOT DETECTED  NOT DETECTED Final    Comment: Performed at [facility], [address] [place], [place] [address]  Blood culture (routine x 2)     Status: None   Collection Time: 12/15/18  8:31 PM  Result Value Ref Range Status   Specimen Description BLOOD LEFT FOREARM  Final   Special Requests   Final    BOTTLES DRAWN AEROBIC AND ANAEROBIC Blood Culture adequate volume   Culture   Final    NO GROWTH 5 DAYS Performed at [facility], [address] [place], [place] [address]    Report Status 12/20/2018 FINAL  Final        Code Status Orders  (From admission, onward)         Start     Ordered   12/15/18 2158  Full code  Continuous     12/15/18 2157        Code Status History    Date Active Date Inactive Code Status Order ID Comments User Context   09/05/2018 1813 09/07/2018 1736 Full Code [id number]  Epifanio Lesches, MD ED   01/24/2017 0857 01/26/2017 1658 Full Code [id number]  Theodoro Grist, MD Inpatient    Advance Directive Documentation     Most Recent Value  Type of Advance Directive  Healthcare Power of Attorney, Living will  Pre-existing out of facility DNR order (yellow form or pink MOST form)  -  "MOST" Form in Place?  -          Follow-up Information    [name], MD Follow up in 6 day(s).   Specialty:  Family Medicine Contact information: [place] [address] [phone]        Benjamine Sprague, DO Follow up in 1 week(s).   Specialty:  Surgery Why:  colon mass Contact information: [place] [address] [phone]        Lequita Asal, MD Follow up in 5 day(s).   Specialty:  Hematology and Oncology Contact information: [place] [address] [phone]           Discharge Medications   Allergies as of 12/21/2018      Reactions   Penicillins Other (See Comments)   Patient unsure of what reaction was. Has patient had a PCN reaction causing immediate rash,  facial/tongue/throat swelling, SOB or lightheadedness with hypotension: unknown Has patient had a PCN reaction causing severe rash involving mucus membranes or skin necrosis: uknown Has patient had a PCN reaction that required hospitalization: unknown Has patient had a PCN reaction occurring within the last 10 years: No If all of the above answers are "NO", then may proceed with Cephalosporin use.   Sulfamethizole Other (See Comments)   Unknown reaction.  Last used long time ago. Quit taking,.      Medication List    STOP taking these medications   apixaban 2.5 MG Tabs tablet Commonly known as:  ELIQUIS   predniSONE 10 MG (21) Tbpk tablet Commonly known as:  STERAPRED UNI-PAK 21 TAB     TAKE these medications   budesonide-formoterol 160-4.5 MCG/ACT inhaler Commonly known as:  SYMBICORT Inhale 2 puffs into the lungs 2 (two) times daily.   carvedilol 6.25 MG tablet Commonly known as:  COREG Take 6.25 mg by mouth 2 (two) times daily.   cholecalciferol 1000 units tablet Commonly known as:  VITAMIN D Take 1,000 Units by mouth daily.   Fish Oil 1000 MG Caps Take 1 capsule by mouth daily.   ipratropium 0.02 % nebulizer solution Commonly known as:  ATROVENT Take 2.5 mLs (0.5 mg total) by nebulization 3 (three) times daily.   levalbuterol 0.63 MG/3ML nebulizer solution Commonly known as:  XOPENEX Take 3 mLs (0.63 mg total) by nebulization 3 (three) times daily.   oxybutynin 5 MG tablet Commonly known as:  DITROPAN Take 5 mg by mouth 2 (two) times daily.   pantoprazole 40 MG tablet Commonly known as:  PROTONIX Take 1 tablet (40 mg total) by mouth daily.   pravastatin 40 MG tablet Commonly known as:  PRAVACHOL Take 40 mg by mouth daily.          Total Time in preparing paper work, data evaluation and todays exam - 17 minutes  Dustin Flock M.D on 12/21/2018 at 2:33 [place]  [phone]

## 2018-12-21 NOTE — Progress Notes (Signed)
Pt and family asking when pt will be discharged. Educated pt and family member that the MD will be to see pt as soon as he is able and will discuss discharge with pt. Family member asking what time MD will be around. Educated that we do not have a schedule, MD will be up when he is able.

## 2018-12-21 NOTE — Care Management Note (Signed)
Case Management Note  Patient Details  Name: [name] MRN: [id number] Date of Birth: Apr 25, 1933   Patient to discharge home today.  Patient lives at home with daughter.  Daughter at bedside.  PCP Brunetta Genera.  Patient has chronic O2 through [place].  PT has assessed patient and recommends home health PT.  Patient and daughter both decline home health services at discharge.  Patient has a RW in the home.  Denies issues with obtaining medications or transportation.   Subjective/Objective:                    Action/Plan:   Expected Discharge Date:  12/21/18               Expected Discharge Plan:  Home/Self Care  In-House Referral:     Discharge planning Services  CM Consult  Post Acute Care Choice:    Choice offered to:     DME Arranged:    DME Agency:     HH Arranged:  Patient Refused [place] Agency:     Status of Service:  Completed, signed off  If discussed at [employer] of Stay Meetings, dates discussed:    Additional Comments:  Beverly Sessions, RN 12/21/2018, 1:56 PM

## 2018-12-21 NOTE — Progress Notes (Signed)
Nsg Discharge Note  Admit Date:  12/15/2018 Discharge date: 12/21/2018   [name] to be D/C'd Home per MD order.  AVS completed.  Copy for chart, and copy for patient signed, and dated. Patient/caregiver able to verbalize understanding.  Discharge Medication: Allergies as of 12/21/2018      Reactions   Penicillins Other (See Comments)   Patient unsure of what reaction was. Has patient had a PCN reaction causing immediate rash, facial/tongue/throat swelling, SOB or lightheadedness with hypotension: unknown Has patient had a PCN reaction causing severe rash involving mucus membranes or skin necrosis: uknown Has patient had a PCN reaction that required hospitalization: unknown Has patient had a PCN reaction occurring within the last 10 years: No If all of the above answers are "NO", then may proceed with Cephalosporin use.   Sulfamethizole Other (See Comments)   Unknown reaction. Last used long time ago. Quit taking,.      Medication List    STOP taking these medications   apixaban 2.5 MG Tabs tablet Commonly known as:  ELIQUIS   predniSONE 10 MG (21) Tbpk tablet Commonly known as:  STERAPRED UNI-PAK 21 TAB     TAKE these medications   budesonide-formoterol 160-4.5 MCG/ACT inhaler Commonly known as:  SYMBICORT Inhale 2 puffs into the lungs 2 (two) times daily.   carvedilol 6.25 MG tablet Commonly known as:  COREG Take 6.25 mg by mouth 2 (two) times daily.   cholecalciferol 1000 units tablet Commonly known as:  VITAMIN D Take 1,000 Units by mouth daily.   Fish Oil 1000 MG Caps Take 1 capsule by mouth daily.   ipratropium 0.02 % nebulizer solution Commonly known as:  ATROVENT Take 2.5 mLs (0.5 mg total) by nebulization 3 (three) times daily.   levalbuterol 0.63 MG/3ML nebulizer solution Commonly known as:  XOPENEX Take 3 mLs (0.63 mg total) by nebulization 3 (three) times daily.   oxybutynin 5 MG tablet Commonly known as:  DITROPAN Take 5 mg by mouth 2 (two)  times daily.   pantoprazole 40 MG tablet Commonly known as:  PROTONIX Take 1 tablet (40 mg total) by mouth daily.   pravastatin 40 MG tablet Commonly known as:  PRAVACHOL Take 40 mg by mouth daily.       Discharge Assessment: Vitals:   12/21/18 0308 12/21/18 1136  BP: 139/86 (!) 157/75  Pulse: 80 69  Resp: 20 16  Temp: 98.6 F (37 C) 97.8 F (36.6 C)  SpO2: 99% 100%   Skin clean, dry and intact without evidence of skin break down, no evidence of skin tears noted. IV catheter discontinued intact. Site without signs and symptoms of complications - no redness or edema noted at insertion site, patient denies c/o pain - only slight tenderness at site.  Dressing with slight pressure applied.  D/c Instructions-Education: Discharge instructions given to patient/family with verbalized understanding. D/c education completed with patient/family including follow up instructions, medication list, d/c activities limitations if indicated, with other d/c instructions as indicated by MD - patient able to verbalize understanding, all questions fully answered. Patient instructed to return to ED, call 911, or call MD for any changes in condition.  Patient escorted via [place], and D/C home via private auto.  Eda Keys, RN 12/21/2018 12:46 PM

## 2018-12-21 NOTE — Anesthesia Postprocedure Evaluation (Signed)
Anesthesia Post Note  Patient: ROXSANA RIDING  Procedure(s) Performed: COLONOSCOPY WITH PROPOFOL (N/A )  Patient location during evaluation: PACU Anesthesia Type: General Level of consciousness: awake and alert Pain management: pain level controlled Vital Signs Assessment: post-procedure vital signs reviewed and stable Respiratory status: spontaneous breathing, nonlabored ventilation, respiratory function stable and patient connected to nasal cannula oxygen Cardiovascular status: blood pressure returned to baseline and stable Postop Assessment: no apparent nausea or vomiting Anesthetic complications: no     Last Vitals:  Vitals:   12/20/18 2025 12/21/18 0308  BP:  139/86  Pulse:  80  Resp:  20  Temp:  37 C  SpO2: 97% 99%    Last Pain:  Vitals:   12/21/18 0308  TempSrc: Oral  PainSc:                  Molli Barrows

## 2018-12-21 NOTE — Care Management Important Message (Signed)
Copy of signed Medicare IM left with patient in room. 

## 2018-12-21 NOTE — Progress Notes (Signed)
Pt's daughter stating "I want to take my mom home now". Pt and daughter provided with discharge paperwork. Educated to call for follow up visits. Pt's daughter asked why the hospital did not set them up for her. Educated that due to her request to leave at this time, we were unable to set up visits. If she wished to wait we would be happy to do so. Pt's daughter declined and stated she would call. Stated "I don't know why we need to follow up with all these doctors". Educated on need for follow up due to hospital admission, med changes and lab work. Pt and daughter verbalizes understanding.

## 2018-12-25 ENCOUNTER — Telehealth: Payer: Self-pay

## 2018-12-25 NOTE — Telephone Encounter (Signed)
Pathology report received. Call placed to Ms. Crawly. Per hospital discharge summary she was to call and make appointments with medical oncology and surgery. Spoke with daughter, Inocente Salles. She reports they had a family meeting yesterday and her mother "does not want to know". I did not review pathology with her. "She said she does not want any surgery or any other treatment". "She does not want a doctor to tell her that she has 6 months to live and have that looming over her". "She would like to live until spring but if she can't then so be it" She states she cannot go against her mother's wishes. She reports the surgeons office has also called and she refused that appointment as well. I did offer what she could expect going forward and educated that we offer services other than treatment in regards to palliative care and hospice transitioning. Encouraged her to provide this information and to call us if she changes her mind. She stated she will hold onto the contact information for the cancer center and call if needed. Oncology Nurse Navigator Documentation  Navigator Location: [facility] (12/25/18 1000)   )Navigator Encounter Type: Introductory phone call;Follow-up Appt (12/25/18 1000)                                                    Time Spent with Patient: 15 (12/25/18 1000)

## 2018-12-27 ENCOUNTER — Other Ambulatory Visit: Payer: Medicare HMO

## 2018-12-27 NOTE — Progress Notes (Signed)
Tumor Board Documentation  [name] was presented by Verlon Au, RN at our Tumor Board on 12/27/2018, which included representatives from medical oncology, radiation oncology, surgical oncology, surgical, radiology, pathology, navigation, internal medicine, research, palliative care, pulmonology.  [name] currently presents as an external consult, for discussion, for [name], for new positive pathology with history of the following treatments: none.  Additionally, we reviewed previous medical and familial history, history of present illness, and recent lab results along with all available histopathologic and imaging studies. The tumor board considered available treatment options and made the following recommendations:   Recommend treatmente or surgery, but patient refusing any treatments  The following procedures/referrals were also placed: No orders of the defined types were placed in this encounter.   Clinical Trial Status: not discussed   Staging used:    National site-specific guidelines   were discussed with respect to the case.  Tumor board is a meeting of clinicians from various specialty areas who evaluate and discuss patients for whom a multidisciplinary approach is being considered. Final determinations in the plan of care are those of the provider(s). The responsibility for follow up of recommendations given during tumor board is that of the provider.   Today's extended care, comprehensive team conference, [name] was not present for the discussion and was not examined.   Multidisciplinary Tumor Board is a multidisciplinary case peer review process.  Decisions discussed in the Multidisciplinary Tumor Board reflect the opinions of the specialists present at the conference without having examined the patient.  Ultimately, treatment and diagnostic decisions rest with the primary provider(s) and the patient.

## 2018-12-28 ENCOUNTER — Encounter: Payer: Self-pay | Admitting: Cardiovascular Disease

## 2018-12-28 ENCOUNTER — Ambulatory Visit: Payer: Medicare HMO | Admitting: Cardiovascular Disease

## 2018-12-28 VITALS — BP 132/78 | HR 60 | Ht 61.0 in | Wt 121.0 lb

## 2018-12-28 DIAGNOSIS — I480 Paroxysmal atrial fibrillation: Secondary | ICD-10-CM

## 2018-12-28 DIAGNOSIS — I10 Essential (primary) hypertension: Secondary | ICD-10-CM | POA: Diagnosis not present

## 2018-12-28 NOTE — Progress Notes (Signed)
Cardiology Office Note   Date:  12/28/2018   ID:  [name], DOB 05-Apr-1933, MRN [id number]  PCP:  [name], MD  Cardiologist:   Kathlyn Sacramento, MD   Chief Complaint  Patient presents with  . other    Follow up 3 mo.,Carotid duplex. Swelling in feet and ankles, compression stockings no help. Hgb 7.9 when left the hospital.Colon Mass with bleeding stopped Eloquis.  Medications reviewed verbally.       History of Present Illness: [name] is a 85 y.o. female who presents for a follow-up visit regarding paroxysmal atrial fibrillation.  She has known history of COPD, previous tobacco use, anemia and beta-blocker induced bradycardia.  She was hospitalized in September with COPD exacerbation and was found to be in atrial fibrillation with rapid ventricular response.  Echo showed an EF of 55 to 60% with mild aortic insufficiency and minimal pulmonary hypertension.  She was rate controlled with carvedilol and ultimately converted to sinus rhythm.  She was started on low-dose Eliquis for anticoagulation. She was hospitalized in December with increased shortness of breath and fatigue and was found to be significantly anemic.  She developed dark stool.  Colonoscopy revealed a mass in the ascending colon.  Biopsy revealed adenocarcinoma.  The case was discussed at the tumor board but the patient declined any treatment. She denies chest pain or worsening dyspnea.  She does complain of bilateral leg edema that has worsened recently.    Past Medical History:  Diagnosis Date  . Bradycardia    a. beta blocker-induced  . Chronic hyponatremia   . Chronic respiratory failure with hypoxia (HCC)    a. secondary to COPD; b. on 2 L via [place] at baseline  . Clavicular fracture    History of   . COPD (chronic obstructive pulmonary disease) ([place])   . Gastroesophageal reflux disease   . Generalized and unspecified atherosclerosis   . HOH (hard of hearing)   . Hyperlipidemia   .  Hypertension   . Mitral regurgitation    a. TTE 2015: LVSF nl, no RWMA, mild MR, trivial AI; b. TTE 9/19 EF 55-60%, no RWMA, mild AI, LA normal in size, RVSF normal, PASP 33  . Orthopnea   . Osteoporosis   . PAF (paroxysmal atrial fibrillation) ([place])    a. diagnosed 9/19: CHADS2VASc at least 5 (HTN, age x 2, vascular disease, female); b. Eliquis   . Tobacco abuse     Past Surgical History:  Procedure Laterality Date  . ABDOMINAL HYSTERECTOMY    . APPENDECTOMY  20+ years ago  . BACK SURGERY  2 years ago   x2  . Cardiac Event Monitor  February 2015   Sinus rhythm sinus bradycardia rates of 46-80 beats a minute. No other arrhythmias; occasional PACs with some being blocked., Occasional first-degree AV block.  Marland Kitchen CATARACT EXTRACTION W/PHACO Left 01/19/2017   Procedure: CATARACT EXTRACTION PHACO AND INTRAOCULAR LENS PLACEMENT (IOC);  Surgeon: Eulogio Bear, MD;  Location: [facility] ORS;  Service: Ophthalmology;  Laterality: Left;  us00:58.3 ap%14.8 cde8.63 lot # [id number]  . CATARACT EXTRACTION W/PHACO Right 02/16/2017   Procedure: CATARACT EXTRACTION PHACO AND INTRAOCULAR LENS PLACEMENT (IOC);  Surgeon: Eulogio Bear, MD;  Location: [facility] ORS;  Service: Ophthalmology;  Laterality: Right;  [place] 1:10.9 AP% 11.0 CDE 7.84 FLUID PACK LOT # [id number]  . COLONOSCOPY WITH PROPOFOL N/A 12/20/2018   Procedure: COLONOSCOPY WITH PROPOFOL;  Surgeon: Virgel Manifold, MD;  Location: [facility] ENDOSCOPY;  Service: Endoscopy;  Laterality: N/A;  . ESOPHAGOGASTRODUODENOSCOPY N/A 12/18/2018   Procedure: ESOPHAGOGASTRODUODENOSCOPY (EGD);  Surgeon: Virgel Manifold, MD;  Location: [facility] ENDOSCOPY;  Service: Endoscopy;  Laterality: N/A;  . TRANSTHORACIC ECHOCARDIOGRAM  01/21/2014   Normal LV size and function. No regional wall motion abnormalities. Mild aortic sclerosis with trivial regurgitation.  . VESICOVAGINAL FISTULA CLOSURE W/ TAH  20+ years ago     Current Outpatient Medications  Medication Sig Dispense  Refill  . aspirin EC 81 MG tablet Take 81 mg by mouth daily.    . budesonide-formoterol (SYMBICORT) 160-4.5 MCG/ACT inhaler Inhale 2 puffs into the lungs 2 (two) times daily.    . carvedilol (COREG) 6.25 MG tablet Take 6.25 mg by mouth 2 (two) times daily.     . cholecalciferol (VITAMIN D) 1000 units tablet Take 1,000 Units by mouth daily.    Marland Kitchen ipratropium (ATROVENT) 0.02 % nebulizer solution Take 2.5 mLs (0.5 mg total) by nebulization 3 (three) times daily. 200 mL 1  . levalbuterol (XOPENEX) 0.63 MG/3ML nebulizer solution Take 3 mLs (0.63 mg total) by nebulization 3 (three) times daily. 200 mL 1  . Omega-3 Fatty Acids (FISH OIL) 1000 MG CAPS Take 1 capsule by mouth daily.    Marland Kitchen oxybutynin (DITROPAN) 5 MG tablet Take 5 mg by mouth 2 (two) times daily.    . pantoprazole (PROTONIX) 40 MG tablet Take 1 tablet (40 mg total) by mouth daily. 30 tablet 0  . pravastatin (PRAVACHOL) 40 MG tablet Take 40 mg by mouth daily.     No current facility-administered medications for this visit.     Allergies:   Penicillins and Sulfamethizole    Social History:  The patient  reports that she quit smoking about 10 months ago. She has a 75.00 pack-year smoking history. She has never used smokeless tobacco. She reports that she does not drink alcohol or use drugs.   Family History:  The patient's family history includes ALS in her daughter; Brain cancer in her brother; Diabetes in her mother; Heart attack in her sister; Heart disease in her sister.    ROS:  Please see the history of present illness.   Otherwise, review of systems are positive for none.   All other systems are reviewed and negative.    PHYSICAL EXAM: VS:  BP 132/78 (BP Location: Left Arm, Patient Position: Sitting, Cuff Size: Normal)   Pulse 60   Ht 5\' 1"  (1.549 m)   Wt 121 lb (54.9 kg)   BMI 22.86 kg/m  , BMI Body mass index is 22.86 kg/m. GEN: Well nourished, well developed, in no acute distress  HEENT: normal  Neck: Mild JVD, carotid  bruits, or masses Cardiac: RRR; no murmurs, rubs, or gallops, mild bilateral leg edema  Respiratory:  clear to auscultation bilaterally, normal work of breathing GI: soft, nontender, nondistended, + BS MS: no deformity or atrophy  Skin: warm and dry, no rash Neuro:  Strength and sensation are intact Psych: euthymic mood, full affect   EKG:  EKG is ordered today. The ekg ordered today demonstrates normal sinus rhythm with possible old septal infarct.   Recent Labs: 09/06/2018: Magnesium 2.0; TSH 0.225 12/20/2018: BUN 10; Creatinine, Ser 0.58; Hemoglobin 7.9; Platelets 333; Potassium 3.0; Sodium 134    Lipid Panel    Component Value Date/Time   CHOL 141 09/06/2018 0527   TRIG 77 09/06/2018 0527   HDL 48 09/06/2018 0527   CHOLHDL 2.9 09/06/2018 0527   VLDL 15 09/06/2018 0527   LDLCALC 78 09/06/2018  0527      Wt Readings from Last 3 Encounters:  12/28/18 121 lb (54.9 kg)  12/15/18 127 lb 14.4 oz (58 kg)  10/11/18 123 lb 8 oz (56 kg)      No flowsheet data found.    ASSESSMENT AND PLAN:  1.  Paroxysmal atrial fibrillation: Currently maintaining in sinus rhythm.  Eliquis was discontinued due to lower GI bleed and the finding of colon mass which turned out to be adenocarcinoma.  The patient is very high risk for recurrent GI bleed if anticoagulation is resumed.  I do not recommend resuming Eliquis due to that. I also advised her not to take aspirin. Continue carvedilol for now.  2.  Essential hypertension: Blood pressure is controlled.  3.  Previous carotid bruit: Carotid Doppler showed mild nonobstructive disease.  No need for future studies.  4.  Recently diagnosed colon cancer: The patient declined any further treatment of this.    Disposition:   FU with me in 6 months  Signed,  Kathlyn Sacramento, MD  12/28/2018 3:26 PM    [place]

## 2018-12-28 NOTE — Patient Instructions (Signed)
Medication Instructions:  STOP the Aspirin If you need a refill on your cardiac medications before your next appointment, please call your pharmacy.   Lab work: None ordered  Testing/Procedures: None ordered  Follow-Up: At [facility], you and your health needs are our priority.  As part of our continuing mission to provide you with exceptional heart care, we have created designated Provider Care Teams.  These Care Teams include your primary Cardiologist (physician) and Advanced Practice Providers (APPs -  Physician Assistants and Nurse Practitioners) who all work together to provide you with the care you need, when you need it. You will need a follow up appointment in 6 month. Please call our office 2 months in advance to schedule this appointment.  You may see Dr. Fletcher Anon or one of the following Advanced Practice Providers on your designated Care Team:   Murray Hodgkins, NP Christell Faith, PA-C . Marrianne Mood, PA-C

## 2019-01-01 DIAGNOSIS — J449 Chronic obstructive pulmonary disease, unspecified: Secondary | ICD-10-CM | POA: Diagnosis not present

## 2019-01-03 ENCOUNTER — Ambulatory Visit: Payer: Medicare HMO | Admitting: Cardiovascular Disease

## 2019-01-03 DIAGNOSIS — K219 Gastro-esophageal reflux disease without esophagitis: Secondary | ICD-10-CM | POA: Diagnosis not present

## 2019-01-03 DIAGNOSIS — J449 Chronic obstructive pulmonary disease, unspecified: Secondary | ICD-10-CM | POA: Diagnosis not present

## 2019-01-03 DIAGNOSIS — I10 Essential (primary) hypertension: Secondary | ICD-10-CM | POA: Diagnosis not present

## 2019-01-03 DIAGNOSIS — N3281 Overactive bladder: Secondary | ICD-10-CM | POA: Diagnosis not present

## 2019-01-03 DIAGNOSIS — K922 Gastrointestinal hemorrhage, unspecified: Secondary | ICD-10-CM | POA: Diagnosis not present

## 2019-01-03 DIAGNOSIS — Z1389 Encounter for screening for other disorder: Secondary | ICD-10-CM | POA: Diagnosis not present

## 2019-01-03 DIAGNOSIS — E7800 Pure hypercholesterolemia, unspecified: Secondary | ICD-10-CM | POA: Diagnosis not present

## 2019-01-03 DIAGNOSIS — R69 Illness, unspecified: Secondary | ICD-10-CM | POA: Diagnosis not present

## 2019-01-07 ENCOUNTER — Other Ambulatory Visit: Payer: Self-pay

## 2019-01-07 DIAGNOSIS — K6389 Other specified diseases of intestine: Secondary | ICD-10-CM

## 2019-01-11 ENCOUNTER — Ambulatory Visit: Payer: Medicare HMO | Admitting: Oncology

## 2019-02-01 DIAGNOSIS — J449 Chronic obstructive pulmonary disease, unspecified: Secondary | ICD-10-CM | POA: Diagnosis not present

## 2019-03-02 DIAGNOSIS — J449 Chronic obstructive pulmonary disease, unspecified: Secondary | ICD-10-CM | POA: Diagnosis not present

## 2019-03-11 DIAGNOSIS — R69 Illness, unspecified: Secondary | ICD-10-CM | POA: Diagnosis not present

## 2019-04-02 DIAGNOSIS — J449 Chronic obstructive pulmonary disease, unspecified: Secondary | ICD-10-CM | POA: Diagnosis not present

## 2019-04-16 DIAGNOSIS — R69 Illness, unspecified: Secondary | ICD-10-CM | POA: Diagnosis not present

## 2019-04-24 DIAGNOSIS — R69 Illness, unspecified: Secondary | ICD-10-CM | POA: Diagnosis not present

## 2019-05-02 DIAGNOSIS — J449 Chronic obstructive pulmonary disease, unspecified: Secondary | ICD-10-CM | POA: Diagnosis not present

## 2019-06-02 DIAGNOSIS — J449 Chronic obstructive pulmonary disease, unspecified: Secondary | ICD-10-CM | POA: Diagnosis not present

## 2019-07-02 DIAGNOSIS — J449 Chronic obstructive pulmonary disease, unspecified: Secondary | ICD-10-CM | POA: Diagnosis not present

## 2019-07-10 DIAGNOSIS — R69 Illness, unspecified: Secondary | ICD-10-CM | POA: Diagnosis not present

## 2019-07-11 DIAGNOSIS — J449 Chronic obstructive pulmonary disease, unspecified: Secondary | ICD-10-CM | POA: Diagnosis not present

## 2019-07-11 DIAGNOSIS — K219 Gastro-esophageal reflux disease without esophagitis: Secondary | ICD-10-CM | POA: Diagnosis not present

## 2019-07-11 DIAGNOSIS — I10 Essential (primary) hypertension: Secondary | ICD-10-CM | POA: Diagnosis not present

## 2019-07-11 DIAGNOSIS — I7090 Unspecified atherosclerosis: Secondary | ICD-10-CM | POA: Diagnosis not present

## 2019-08-01 DIAGNOSIS — R69 Illness, unspecified: Secondary | ICD-10-CM | POA: Diagnosis not present

## 2019-08-02 DIAGNOSIS — J449 Chronic obstructive pulmonary disease, unspecified: Secondary | ICD-10-CM | POA: Diagnosis not present

## 2019-09-02 DIAGNOSIS — J449 Chronic obstructive pulmonary disease, unspecified: Secondary | ICD-10-CM | POA: Diagnosis not present

## 2019-09-26 DIAGNOSIS — R69 Illness, unspecified: Secondary | ICD-10-CM | POA: Diagnosis not present

## 2019-10-02 DIAGNOSIS — J449 Chronic obstructive pulmonary disease, unspecified: Secondary | ICD-10-CM | POA: Diagnosis not present

## 2019-10-10 DIAGNOSIS — R69 Illness, unspecified: Secondary | ICD-10-CM | POA: Diagnosis not present

## 2019-11-02 DIAGNOSIS — J449 Chronic obstructive pulmonary disease, unspecified: Secondary | ICD-10-CM | POA: Diagnosis not present

## 2019-11-06 DIAGNOSIS — R69 Illness, unspecified: Secondary | ICD-10-CM | POA: Diagnosis not present

## 2019-11-23 IMAGING — DX DG CHEST 1V
1 series · 1 of 1 positions shown · non-contrast
Comparison: 01/24/2017, CT 01/24/2017

CLINICAL DATA: Shortness of breath

EXAM:
CHEST  1 VIEW

[chest ap]
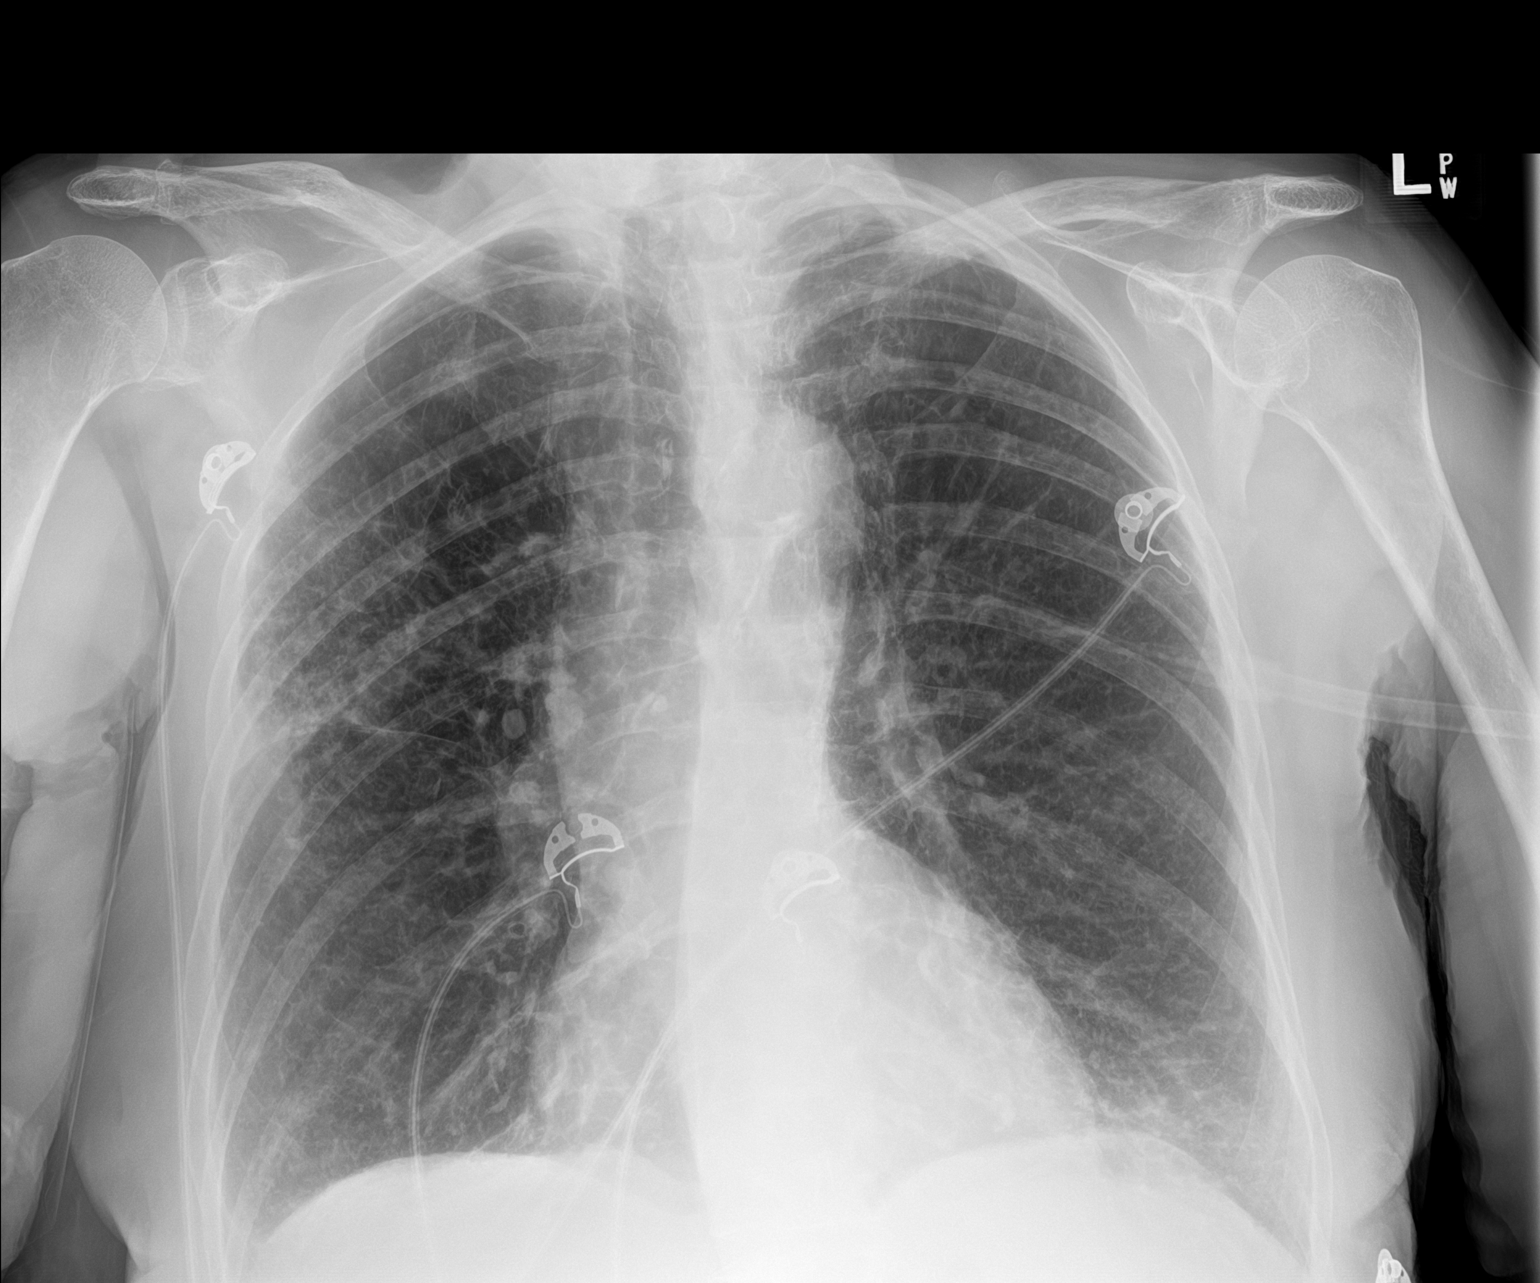

[1 of 1 positions shown; findings below may reference images not displayed]

FINDINGS: Hyperinflation with emphysematous disease. Ill-defined opacity in
the right upper lobe and left base suspicious for superimposed
infiltrates. Stable cardiomediastinal silhouette with aortic
atherosclerosis. Left apical pleural scarring.
IMPRESSION: Emphysematous disease with ill-defined right upper lobe and left
lung base opacities suspicious for multifocal infiltrates.

## 2019-12-02 DIAGNOSIS — J449 Chronic obstructive pulmonary disease, unspecified: Secondary | ICD-10-CM | POA: Diagnosis not present

## 2020-03-03 IMAGING — CR DG CHEST 2V
1 series · 2 of 2 positions shown · non-contrast
Comparison: 09/05/2018

CLINICAL DATA: COPD with increasing dyspnea for the past few days.

EXAM:
CHEST - 2 VIEW

[Series 1: dg chest 2 view · 0.14mm/px · 2 of 2 slices shown]
[im 1/2]
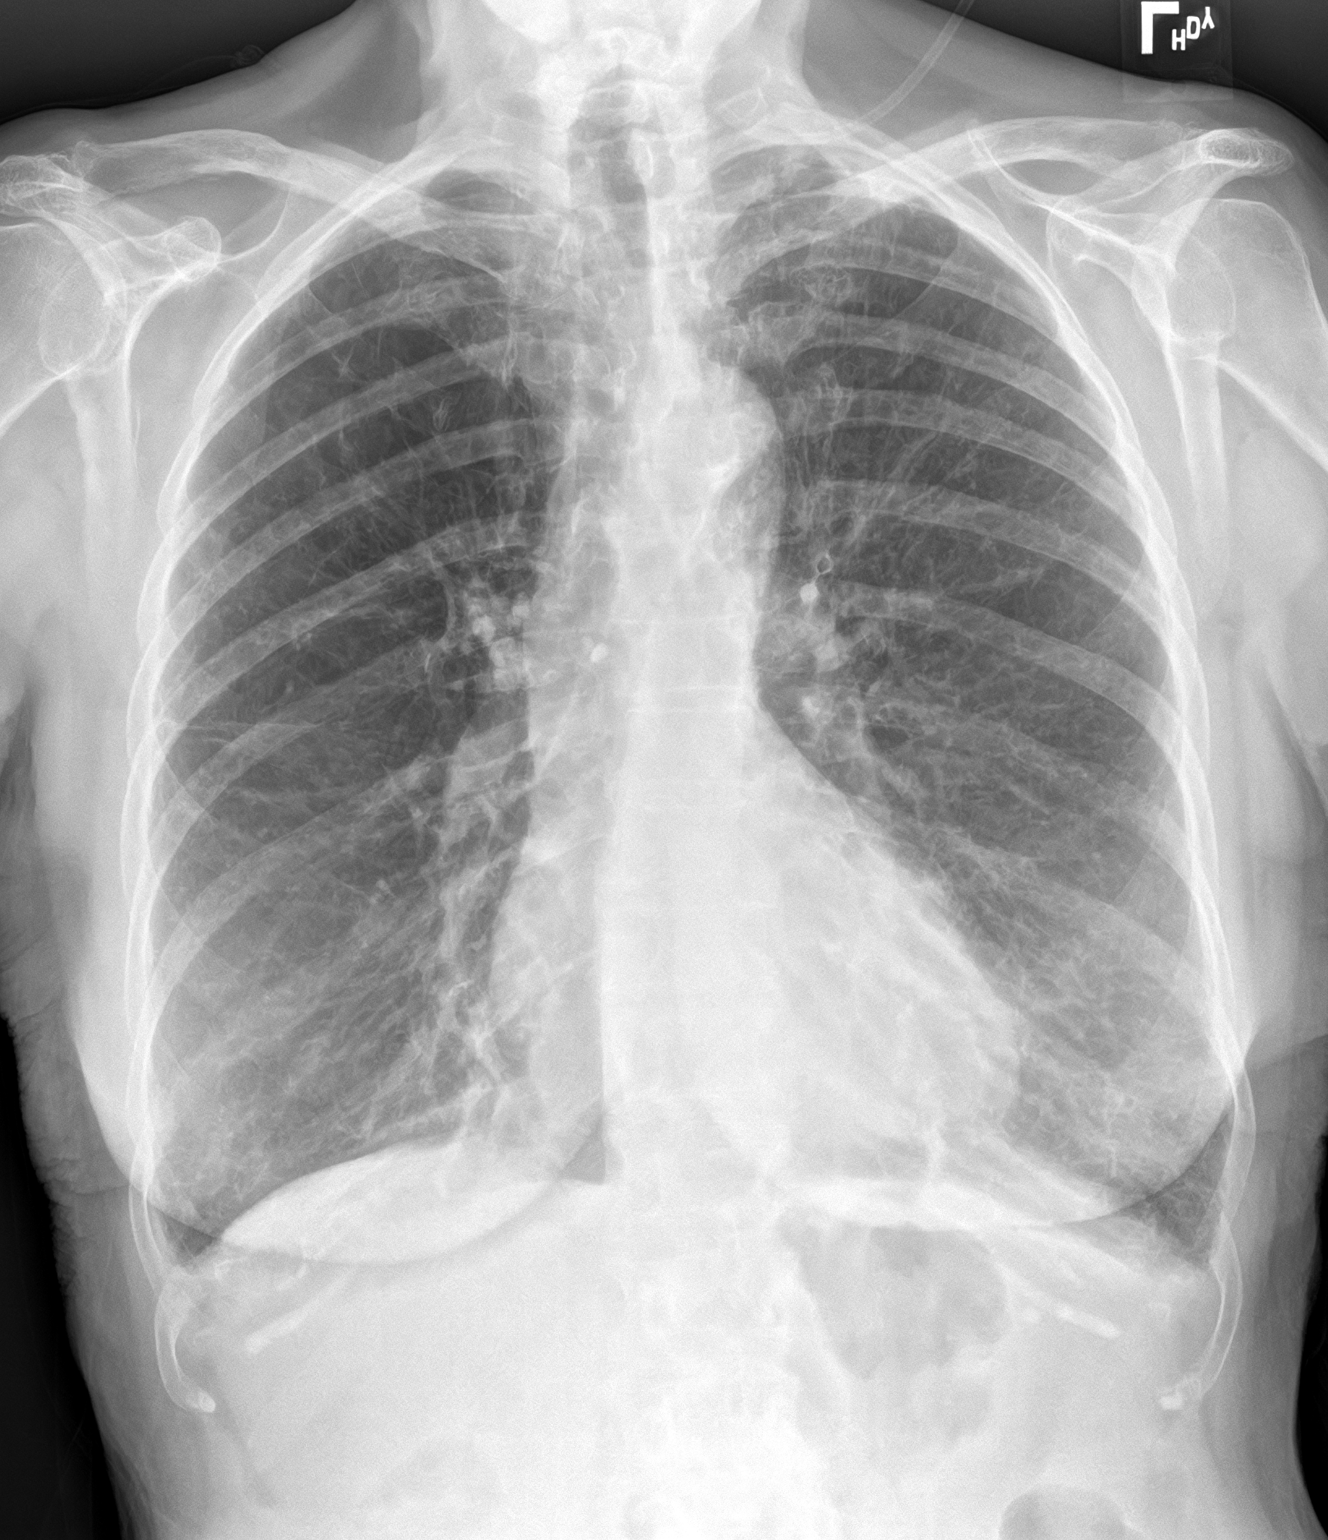
[im 2/2]
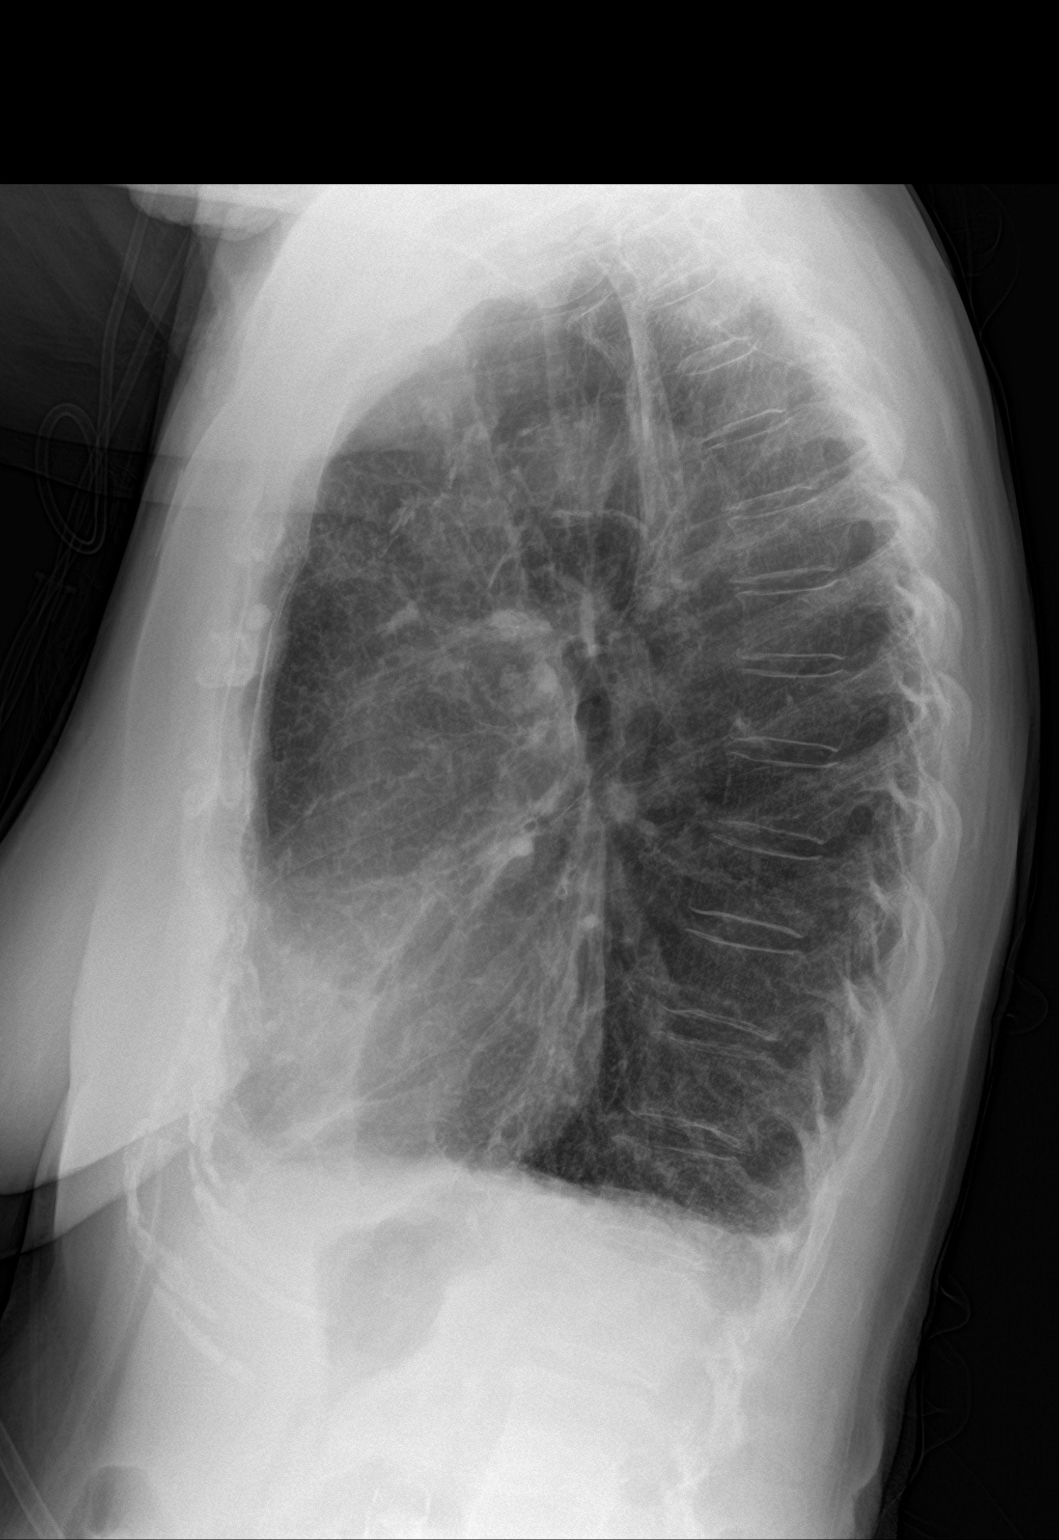

[2 of 2 positions shown; findings below may reference images not displayed]

FINDINGS: Emphysematous hyperinflation of the lungs. Normal heart size.
Tortuous atherosclerotic aorta without aneurysm. Partial clearing of
airspace opacities in the right upper lobe. Scarring is suggested at
the left lung apex unchanged. No dominant mass or pulmonary
consolidations. Subsegmental atelectasis is seen at the left lung
base. No acute nor suspicious osseous abnormalities.
IMPRESSION: Emphysematous hyperinflation of the lungs. Partial clearing of right
upper lobe pneumonia. No new pulmonary consolidation or CHF.

## 2020-03-13 ENCOUNTER — Other Ambulatory Visit: Payer: Self-pay

## 2020-03-13 ENCOUNTER — Inpatient Hospital Stay
Admission: EM | Admit: 2020-03-13 | Discharge: 2020-03-14 | DRG: 377 | Disposition: A | Discharge status: Hospice | Payer: Medicare HMO | Attending: Internal Medicine | Admitting: Internal Medicine

## 2020-03-13 ENCOUNTER — Encounter: Payer: Self-pay | Admitting: Emergency Medicine

## 2020-03-13 DIAGNOSIS — K922 Gastrointestinal hemorrhage, unspecified: Secondary | ICD-10-CM | POA: Diagnosis present

## 2020-03-13 DIAGNOSIS — Z8711 Personal history of peptic ulcer disease: Secondary | ICD-10-CM | POA: Diagnosis not present

## 2020-03-13 DIAGNOSIS — Z20822 Contact with and (suspected) exposure to covid-19: Secondary | ICD-10-CM | POA: Diagnosis present

## 2020-03-13 DIAGNOSIS — Z66 Do not resuscitate: Secondary | ICD-10-CM | POA: Diagnosis present

## 2020-03-13 DIAGNOSIS — Z833 Family history of diabetes mellitus: Secondary | ICD-10-CM

## 2020-03-13 DIAGNOSIS — Z515 Encounter for palliative care: Secondary | ICD-10-CM | POA: Diagnosis not present

## 2020-03-13 DIAGNOSIS — E869 Volume depletion, unspecified: Secondary | ICD-10-CM | POA: Diagnosis present

## 2020-03-13 DIAGNOSIS — D649 Anemia, unspecified: Secondary | ICD-10-CM | POA: Diagnosis not present

## 2020-03-13 DIAGNOSIS — J9611 Chronic respiratory failure with hypoxia: Secondary | ICD-10-CM | POA: Diagnosis present

## 2020-03-13 DIAGNOSIS — E871 Hypo-osmolality and hyponatremia: Secondary | ICD-10-CM | POA: Diagnosis present

## 2020-03-13 DIAGNOSIS — J449 Chronic obstructive pulmonary disease, unspecified: Secondary | ICD-10-CM | POA: Diagnosis present

## 2020-03-13 DIAGNOSIS — M810 Age-related osteoporosis without current pathological fracture: Secondary | ICD-10-CM | POA: Diagnosis present

## 2020-03-13 DIAGNOSIS — H9190 Unspecified hearing loss, unspecified ear: Secondary | ICD-10-CM | POA: Diagnosis present

## 2020-03-13 DIAGNOSIS — E785 Hyperlipidemia, unspecified: Secondary | ICD-10-CM | POA: Diagnosis present

## 2020-03-13 DIAGNOSIS — Z8249 Family history of ischemic heart disease and other diseases of the circulatory system: Secondary | ICD-10-CM

## 2020-03-13 DIAGNOSIS — D72829 Elevated white blood cell count, unspecified: Secondary | ICD-10-CM

## 2020-03-13 DIAGNOSIS — K219 Gastro-esophageal reflux disease without esophagitis: Secondary | ICD-10-CM | POA: Diagnosis present

## 2020-03-13 DIAGNOSIS — N179 Acute kidney failure, unspecified: Secondary | ICD-10-CM

## 2020-03-13 DIAGNOSIS — Z882 Allergy status to sulfonamides status: Secondary | ICD-10-CM

## 2020-03-13 DIAGNOSIS — F0390 Unspecified dementia without behavioral disturbance: Secondary | ICD-10-CM | POA: Diagnosis present

## 2020-03-13 DIAGNOSIS — R001 Bradycardia, unspecified: Secondary | ICD-10-CM | POA: Diagnosis present

## 2020-03-13 DIAGNOSIS — Z681 Body mass index (BMI) 19 or less, adult: Secondary | ICD-10-CM | POA: Diagnosis not present

## 2020-03-13 DIAGNOSIS — K921 Melena: Principal | ICD-10-CM

## 2020-03-13 DIAGNOSIS — I10 Essential (primary) hypertension: Secondary | ICD-10-CM | POA: Diagnosis present

## 2020-03-13 DIAGNOSIS — E43 Unspecified severe protein-calorie malnutrition: Secondary | ICD-10-CM | POA: Diagnosis present

## 2020-03-13 DIAGNOSIS — D62 Acute posthemorrhagic anemia: Secondary | ICD-10-CM | POA: Diagnosis present

## 2020-03-13 DIAGNOSIS — N3281 Overactive bladder: Secondary | ICD-10-CM | POA: Diagnosis present

## 2020-03-13 DIAGNOSIS — Z87891 Personal history of nicotine dependence: Secondary | ICD-10-CM

## 2020-03-13 DIAGNOSIS — Z79899 Other long term (current) drug therapy: Secondary | ICD-10-CM

## 2020-03-13 DIAGNOSIS — Z880 Allergy status to penicillin: Secondary | ICD-10-CM

## 2020-03-13 DIAGNOSIS — I480 Paroxysmal atrial fibrillation: Secondary | ICD-10-CM | POA: Diagnosis present

## 2020-03-13 DIAGNOSIS — C189 Malignant neoplasm of colon, unspecified: Secondary | ICD-10-CM | POA: Diagnosis present

## 2020-03-13 DIAGNOSIS — Z87440 Personal history of urinary (tract) infections: Secondary | ICD-10-CM

## 2020-03-13 DIAGNOSIS — Z808 Family history of malignant neoplasm of other organs or systems: Secondary | ICD-10-CM

## 2020-03-13 HISTORY — DX: Malignant (primary) neoplasm, unspecified: C80.1

## 2020-03-13 LAB — CBC WITH DIFFERENTIAL/PLATELET
Abs Immature Granulocytes: 0.14 10*3/uL — ABNORMAL HIGH (ref 0.00–0.07)
Basophils Absolute: 0 10*3/uL (ref 0.0–0.1)
Basophils Relative: 0 %
Eosinophils Absolute: 0 10*3/uL (ref 0.0–0.5)
Eosinophils Relative: 0 %
HCT: 17.7 % — ABNORMAL LOW (ref 36.0–46.0)
Hemoglobin: 5.2 g/dL — ABNORMAL LOW (ref 12.0–15.0)
Immature Granulocytes: 1 %
Lymphocytes Relative: 10 %
Lymphs Abs: 1.5 10*3/uL (ref 0.7–4.0)
MCH: 25 pg — ABNORMAL LOW (ref 26.0–34.0)
MCHC: 29.4 g/dL — ABNORMAL LOW (ref 30.0–36.0)
MCV: 85.1 fL (ref 80.0–100.0)
Monocytes Absolute: 0.8 10*3/uL (ref 0.1–1.0)
Monocytes Relative: 5 %
Neutro Abs: 12.5 10*3/uL — ABNORMAL HIGH (ref 1.7–7.7)
Neutrophils Relative %: 84 %
Platelets: 264 10*3/uL (ref 150–400)
RBC: 2.08 MIL/uL — ABNORMAL LOW (ref 3.87–5.11)
RDW: 18 % — ABNORMAL HIGH (ref 11.5–15.5)
WBC: 14.9 10*3/uL — ABNORMAL HIGH (ref 4.0–10.5)
nRBC: 0 % (ref 0.0–0.2)

## 2020-03-13 LAB — PREPARE RBC (CROSSMATCH)

## 2020-03-13 LAB — BASIC METABOLIC PANEL
Anion gap: 11 (ref 5–15)
BUN: 38 mg/dL — ABNORMAL HIGH (ref 8–23)
CO2: 21 mmol/L — ABNORMAL LOW (ref 22–32)
Calcium: 8.5 mg/dL — ABNORMAL LOW (ref 8.9–10.3)
Chloride: 102 mmol/L (ref 98–111)
Creatinine, Ser: 1.17 mg/dL — ABNORMAL HIGH (ref 0.44–1.00)
GFR calc Af Amer: 49 mL/min — ABNORMAL LOW (ref >60)
GFR calc non Af Amer: 42 mL/min — ABNORMAL LOW (ref >60)
Glucose, Bld: 107 mg/dL — ABNORMAL HIGH (ref 70–99)
Potassium: 4.3 mmol/L (ref 3.5–5.1)
Sodium: 134 mmol/L — ABNORMAL LOW (ref 135–145)

## 2020-03-13 MED ORDER — ACETAMINOPHEN 325 MG PO TABS: 650.0000 mg | ORAL_TABLET | Freq: Four times a day (QID) | ORAL | Status: DC | PRN

## 2020-03-13 MED ORDER — OMEGA-3-ACID ETHYL ESTERS 1 G PO CAPS
1.0000 g | ORAL_CAPSULE | Freq: Every day | ORAL | Status: DC
  Filled 2020-03-13: qty 1

## 2020-03-13 MED ORDER — SODIUM CHLORIDE 0.9 % IV SOLN
10.0000 mL/h | Freq: Once | INTRAVENOUS | Status: AC
  Administered 2020-03-13: 10 mL/h via INTRAVENOUS

## 2020-03-13 MED ORDER — ONDANSETRON HCL 4 MG/2ML IJ SOLN: 4.0000 mg | Freq: Four times a day (QID) | INTRAMUSCULAR | Status: DC | PRN

## 2020-03-13 MED ORDER — ONDANSETRON HCL 4 MG PO TABS: 4.0000 mg | ORAL_TABLET | Freq: Four times a day (QID) | ORAL | Status: DC | PRN

## 2020-03-13 MED ORDER — PRAVASTATIN SODIUM 20 MG PO TABS
40.0000 mg | ORAL_TABLET | Freq: Every day | ORAL | Status: DC
  Filled 2020-03-13: qty 1
  Filled 2020-03-13: qty 2

## 2020-03-13 MED ORDER — PANTOPRAZOLE SODIUM 40 MG IV SOLR: 40.0000 mg | Freq: Two times a day (BID) | INTRAVENOUS | Status: DC

## 2020-03-13 MED ORDER — DONEPEZIL HCL 5 MG PO TABS
10.0000 mg | ORAL_TABLET | Freq: Every day | ORAL | Status: DC
  Filled 2020-03-13: qty 2

## 2020-03-13 MED ORDER — ACETAMINOPHEN 650 MG RE SUPP: 650.0000 mg | Freq: Four times a day (QID) | RECTAL | Status: DC | PRN

## 2020-03-13 MED ORDER — LEVALBUTEROL HCL 0.63 MG/3ML IN NEBU: 0.6300 mg | INHALATION_SOLUTION | Freq: Once | RESPIRATORY_TRACT | Status: DC

## 2020-03-13 MED ORDER — VITAMIN D 25 MCG (1000 UNIT) PO TABS: 1000.0000 [IU] | ORAL_TABLET | Freq: Every day | ORAL | Status: DC

## 2020-03-13 MED ORDER — SODIUM CHLORIDE 0.9 % IV SOLN: INTRAVENOUS | Status: DC

## 2020-03-13 MED ORDER — TRAZODONE HCL 50 MG PO TABS: 25.0000 mg | ORAL_TABLET | Freq: Every evening | ORAL | Status: DC | PRN

## 2020-03-13 MED ORDER — SODIUM CHLORIDE 0.9 % IV SOLN
80.0000 mg | Freq: Once | INTRAVENOUS | Status: AC
  Administered 2020-03-13: 80 mg via INTRAVENOUS
  Filled 2020-03-13: qty 80

## 2020-03-13 MED ORDER — SODIUM CHLORIDE 0.9 % IV SOLN
8.0000 mg/h | INTRAVENOUS | Status: DC
  Administered 2020-03-13: 8 mg/h via INTRAVENOUS
  Filled 2020-03-13 (×2): qty 80

## 2020-03-13 MED ORDER — PANTOPRAZOLE SODIUM 40 MG IV SOLR
40.0000 mg | Freq: Once | INTRAVENOUS | Status: DC
  Filled 2020-03-13: qty 40

## 2020-03-13 MED ORDER — OXYBUTYNIN CHLORIDE 5 MG PO TABS
5.0000 mg | ORAL_TABLET | Freq: Two times a day (BID) | ORAL | Status: DC
  Administered 2020-03-14: 5 mg via ORAL
  Filled 2020-03-13 (×3): qty 1

## 2020-03-13 MED ORDER — CARVEDILOL 6.25 MG PO TABS
6.2500 mg | ORAL_TABLET | Freq: Two times a day (BID) | ORAL | Status: DC
  Administered 2020-03-14: 6.25 mg via ORAL
  Filled 2020-03-13: qty 1

## 2020-03-13 NOTE — ED Notes (Signed)
Butch, RN to attempt for 2nd IV line soon. May end up using [place] as pt is hard stick.

## 2020-03-13 NOTE — ED Notes (Addendum)
Pt given more warm blankets. Pt currently refusing 2nd IV line. Educated.

## 2020-03-13 NOTE — ED Notes (Signed)
HOB adjusted for pt.

## 2020-03-13 NOTE — ED Triage Notes (Signed)
Pt to ED via POV with daughter who states that pt was sent over by her PCP for blood transfusion. States that Hgb was low when blood drawn yesterday. Pt is oriented to person. Per daughter this is baseline.

## 2020-03-13 NOTE — H&P (Signed)
[place] [facility] [place] NAME: [name]    MR#:  [id number]  DATE OF BIRTH:  1932/12/30  DATE OF ADMISSION:  03/13/2020  PRIMARY CARE PHYSICIAN: [name], MD   REQUESTING/REFERRING PHYSICIAN: Duffy Bruce, MD  CHIEF COMPLAINT:   Chief Complaint  Patient presents with  . Abnormal Lab    HISTORY OF PRESENT ILLNESS:  [name]  is a 86 y.o. Caucasian female with a known history of COPD, hypertension, colon cancer, dyslipidemia, proximal atrial fibrillation, tobacco abuse and peptic ulcer disease, who presented to the emergency room with acute onset of increasing weakness and fatigue over the last several weeks with associated melanotic stools.  This was thought to be related to iron supplement but outpatient labs were drawn and she was noted to have severe anemia for which she came to the ER.  The patient denies any nausea or vomiting or bright red bleeding per rectum. The patient takes [facility] powder frequently, about twice a day on a regular basis and has been doing so for a long time.   No hematemesis or jaundice.  She admits to dyspnea and wheezing with her COPD.  She has been having dyschezia.  She denies any abdominal pain.  No dysuria, oliguria or hematuria or flank pain.  No other bleeding diathesis.  She has chosen to have no therapy for her colon cancer and currently this procedures including upper and lower GI endoscopy but is agreeable with blood transfusion.  Upon presentation to the emergency room, blood pressure was 116/46 with otherwise normal vital signs.  Labs revealed Hemoglobin of 5.2 hematocrit 17.7 with leukocytosis 14.9 and neutrophilia as well as low RBC indices.  Stool guaiac came back positive.  The patient was typed and crossmatch will be transfused 3 units of packed red blood cells.  She was ordered IV Protonix and was given Xopenex.  She will be admitted to medical monitored bed for further evaluation and management. PAST  MEDICAL HISTORY:   Past Medical History:  Diagnosis Date  . Bradycardia    a. beta blocker-induced  . Cancer ([place])   . Chronic hyponatremia   . Chronic respiratory failure with hypoxia (HCC)    a. secondary to COPD; b. on 2 L via [place] at baseline  . Clavicular fracture    History of   . COPD (chronic obstructive pulmonary disease) ([place])   . Gastroesophageal reflux disease   . Generalized and unspecified atherosclerosis   . HOH (hard of hearing)   . Hyperlipidemia   . Hypertension   . Mitral regurgitation    a. TTE 2015: LVSF nl, no RWMA, mild MR, trivial AI; b. TTE 9/19 EF 55-60%, no RWMA, mild AI, LA normal in size, RVSF normal, PASP 33  . Orthopnea   . Osteoporosis   . PAF (paroxysmal atrial fibrillation) ([place])    a. diagnosed 9/19: CHADS2VASc at least 5 (HTN, age x 2, vascular disease, female); b. Eliquis   . Tobacco abuse   Dementia/cognitive dysfunction  PAST SURGICAL HISTORY:   Past Surgical History:  Procedure Laterality Date  . ABDOMINAL HYSTERECTOMY    . APPENDECTOMY  20+ years ago  . BACK SURGERY  2 years ago   x2  . Cardiac Event Monitor  February 2015   Sinus rhythm sinus bradycardia rates of 46-80 beats a minute. No other arrhythmias; occasional PACs with some being blocked., Occasional first-degree AV block.  Marland Kitchen CATARACT EXTRACTION W/PHACO Left 01/19/2017   Procedure: CATARACT EXTRACTION PHACO  AND INTRAOCULAR LENS PLACEMENT (IOC);  Surgeon: Eulogio Bear, MD;  Location: [facility] ORS;  Service: Ophthalmology;  Laterality: Left;  us00:58.3 ap%14.8 cde8.63 lot # [id number]  . CATARACT EXTRACTION W/PHACO Right 02/16/2017   Procedure: CATARACT EXTRACTION PHACO AND INTRAOCULAR LENS PLACEMENT (IOC);  Surgeon: Eulogio Bear, MD;  Location: [facility] ORS;  Service: Ophthalmology;  Laterality: Right;  [place] 1:10.9 AP% 11.0 CDE 7.84 FLUID PACK LOT # [id number]  . COLONOSCOPY WITH PROPOFOL N/A 12/20/2018   Procedure: COLONOSCOPY WITH PROPOFOL;  Surgeon: Virgel Manifold, MD;   Location: [facility] ENDOSCOPY;  Service: Endoscopy;  Laterality: N/A;  . ESOPHAGOGASTRODUODENOSCOPY N/A 12/18/2018   Procedure: ESOPHAGOGASTRODUODENOSCOPY (EGD);  Surgeon: Virgel Manifold, MD;  [facility];  Service: Endoscopy;  Laterality: N/A;  . TRANSTHORACIC ECHOCARDIOGRAM  01/21/2014   Normal LV size and function. No regional wall motion abnormalities. Mild aortic sclerosis with trivial regurgitation.  . VESICOVAGINAL FISTULA CLOSURE W/ TAH  20+ years ago    SOCIAL HISTORY:   Social History   Tobacco Use  . Smoking status: Former Smoker    Packs/day: 1.50    Years: 50.00    Pack years: 75.00    Quit date: 02/16/2018    Years since quitting: 2.0  . Smokeless tobacco: Never Used  Substance Use Topics  . Alcohol use: No    FAMILY HISTORY:   Family History  Problem Relation Age of Onset  . Diabetes Mother   . Heart disease Sister   . Heart attack Sister   . Brain cancer Brother   . ALS Daughter     DRUG ALLERGIES:   Allergies  Allergen Reactions  . Penicillins Other (See Comments)    Patient unsure of what reaction was. Has patient had a PCN reaction causing immediate rash, facial/tongue/throat swelling, SOB or lightheadedness with hypotension: unknown Has patient had a PCN reaction causing severe rash involving mucus membranes or skin necrosis: uknown Has patient had a PCN reaction that required hospitalization: unknown Has patient had a PCN reaction occurring within the last 10 years: No If all of the above answers are "NO", then may proceed with Cephalosporin use.   Francine Graven Other (See Comments)    Unknown reaction. Last used long time ago. Quit taking,.    REVIEW OF SYSTEMS:   ROS As per history of present illness. All pertinent systems were reviewed above. Constitutional,  HEENT, cardiovascular, respiratory, GI, GU, musculoskeletal, neuro, psychiatric, endocrine,  integumentary and hematologic systems were reviewed and are otherwise   negative/unremarkable except for positive findings mentioned above in the HPI.   MEDICATIONS AT HOME:   Prior to Admission medications   Medication Sig Start Date End Date Taking? Authorizing Provider  carvedilol (COREG) 6.25 MG tablet Take 6.25 mg by mouth 2 (two) times daily.    Yes [provider]  cholecalciferol (VITAMIN D) 1000 units tablet Take 1,000 Units by mouth daily.   Yes [provider]  donepezil (ARICEPT) 10 MG tablet Take 10 mg by mouth at bedtime. 03/13/20  Yes [provider]  Omega-3 Fatty Acids (FISH OIL) 1000 MG CAPS Take 1 capsule by mouth daily.   Yes [provider]  oxybutynin (DITROPAN) 5 MG tablet Take 5 mg by mouth 2 (two) times daily. 12/26/16  Yes [provider]  pantoprazole (PROTONIX) 40 MG tablet Take 1 tablet (40 mg total) by mouth daily. 09/08/18  Yes Salary, Avel Peace, MD  pravastatin (PRAVACHOL) 40 MG tablet Take 40 mg by mouth daily.  Yes [provider]  megestrol (MEGACE) 40 MG tablet Take 40 mg by mouth daily. 03/12/20   [provider]      VITAL SIGNS:  Blood pressure (!) 99/51, pulse 61, temperature 97.9 F (36.6 C), temperature source Oral, resp. rate (!) 24, height 4\' 11"  (1.499 m), weight 38.1 kg, SpO2 98 %.  PHYSICAL EXAMINATION:  Physical Exam  GENERAL:  86 y.o.-year-old Caucasian female patient lying in the bed with no acute distress.  EYES: Pupils equal, round, reactive to light and accommodation. No scleral icterus.  Positive conjunctival pallor.  Extraocular muscles intact.  HEENT: Head atraumatic, normocephalic. Oropharynx and nasopharynx clear.  NECK:  Supple, no jugular venous distention. No thyroid enlargement, no tenderness.  LUNGS: Normal breath sounds bilaterally, no wheezing, rales,rhonchi or crepitation. No use of accessory muscles of respiration.  CARDIOVASCULAR: Regular rate and rhythm, S1, S2 normal. No murmurs, rubs, or gallops.  ABDOMEN: Soft, nondistended,  nontender. Bowel sounds present. No organomegaly or mass.  EXTREMITIES: No pedal edema, cyanosis, or clubbing.  NEUROLOGIC: Cranial nerves II through XII are intact. Muscle strength 5/5 in all extremities. Sensation intact. Gait not checked.  PSYCHIATRIC: The patient is alert and oriented x 3.  Normal affect and good eye contact. SKIN: No obvious rash, lesion, or ulcer.   LABORATORY PANEL:   CBC Recent Labs  Lab 03/13/20 1631  WBC 14.9*  HGB 5.2*  HCT 17.7*  PLT 264   ------------------------------------------------------------------------------------------------------------------  Chemistries  Recent Labs  Lab 03/13/20 1631  NA 134*  K 4.3  CL 102  CO2 21*  GLUCOSE 107*  BUN 38*  CREATININE 1.17*  CALCIUM 8.5*   ------------------------------------------------------------------------------------------------------------------  Cardiac Enzymes No results for input(s): TROPONINI in the last 168 hours. ------------------------------------------------------------------------------------------------------------------  RADIOLOGY:  No results found.    IMPRESSION AND PLAN:   1.  GI bleeding likely of upper GI etiology with associated melena, with subsequent acute blood loss anemia.  This likely secondary to possible gastritis or duodenitis, gastric or duodenal erosions that could be secondary to excessive amount of aspirin and Goody powders. -The patient will be admitted to a medical monitored bed. -We will follow serial hemoglobin hematocrits. -We will transfuse 3 units of packed red blood cells and follow posttransfusion H&H. -We will continue her on IV Protonix drip after 80 mg IV bolus. -She will be hydrated with IV normal saline. -A GI consultation will be obtained. -I notified Dr. Marius Ditch about the patient.  2.  Mild acute kidney injury. -This is likely prerenal secondary to volume depletion with bleeding. -The patient will be placed on hydration with IV normal  saline and will follow BMP.  3.  Leukocytosis. -This is likely secondary to stress demargination with GI bleeding. -We will follow CBC. -Obtain urinalysis.  4.  Essential hypertension. -We will continue Coreg.  5.  COPD with no current exacerbation. -We will place her on as needed duo nebs.  6.  Cognitive dysfunction/dementia. -We will continue Aricept.  7.  Overactive bladder. -We will continue Ditropan.  8.  Dyslipidemia. -We will continue statin therapy and fish oil/Lovaza.  9.  DVT prophylaxis. -She will be placed on SCDs. -Medical prophylaxis currently contraindicated due to GI bleeding.  10.  GI prophylaxis. -This was addressed above.   All the records are reviewed and case discussed with ED provider. The plan of care was discussed in details with the patient and her daughter Denyce Robert and the later clearly indicated that the patient cannot make a decision for herself  for procedures and that she is her power of care and must be notified about any change of plan of care and has the sign consents.  For now she refuses procedures.  Her phone number is [phone].  I answered all questions. The patient agreed to proceed with the above mentioned plan. Further management will depend upon hospital course.   CODE STATUS: This was discussed with the patient and her daughter.  She desires to be DNR/DNI.    The patient is admitted to inpatient status due to the intensity of medical problems, potential risks involved and management requirement that will necessitate more than 2 midnights.  TOTAL TIME TAKING CARE OF THIS PATIENT: 55 minutes.    Christel Mormon M.D on 03/13/2020 at 9:10 PM  [facility]   From 7 PM-7 AM, contact night-coverage [email or web address]  CC: Primary care physician; [name], MD   Note: This dictation was prepared with Dragon dictation along with smaller phrase technology. Any transcriptional errors that result from this process are  unintentional.

## 2020-03-13 NOTE — ED Provider Notes (Signed)
[facility] Emergency Department Provider Note  ____________________________________________   First MD Initiated Contact with Patient 03/13/20 1923     (approximate)  I have reviewed the triage vital signs and the nursing notes.   HISTORY  Chief Complaint Abnormal Lab    HPI [name] is a 86 y.o. female  With PMHx HTN, HLD, COPD, pAFib, here with weakness. H/o known colon CA. She reportedly has been increasingly weak over the past several weeks. She has had increasingly black, tarry stools.  Family thought this was due to her iron supplements but she had outpt labs drawn and was noted to be severely anemic. She has not c/o any abdominal pain. She has not had any bright red bloody stools or hematochezia. No hematemesis. Pt has elected not to undergo tx for her cancer and would prefer minimum invasive procedures, though is amenable to transfusion. History provided mostly from her daughter, who is her HCPOA.       Past Medical History:  Diagnosis Date  . Bradycardia    a. beta blocker-induced  . Cancer ([place])   . Chronic hyponatremia   . Chronic respiratory failure with hypoxia (HCC)    a. secondary to COPD; b. on 2 L via [place] at baseline  . Clavicular fracture    History of   . COPD (chronic obstructive pulmonary disease) ([place])   . Gastroesophageal reflux disease   . Generalized and unspecified atherosclerosis   . HOH (hard of hearing)   . Hyperlipidemia   . Hypertension   . Mitral regurgitation    a. TTE 2015: LVSF nl, no RWMA, mild MR, trivial AI; b. TTE 9/19 EF 55-60%, no RWMA, mild AI, LA normal in size, RVSF normal, PASP 33  . Orthopnea   . Osteoporosis   . PAF (paroxysmal atrial fibrillation) ([place])    a. diagnosed 9/19: CHADS2VASc at least 5 (HTN, age x 2, vascular disease, female); b. Eliquis   . Tobacco abuse     Patient Active Problem List   Diagnosis Date Noted  . GI bleeding 03/13/2020  . Benign neoplasm of cecum   . Polyp of  sigmoid colon   . Diverticulosis of large intestine without diverticulitis   . Neoplasm of digestive system   . Iron deficiency anemia   . Gastrointestinal hemorrhage   . Upper GI bleed 12/15/2018  . COPD exacerbation ([place]) 09/05/2018  . Hypomagnesemia 01/25/2017  . Hyponatremia 01/24/2017  . Hypokalemia 01/24/2017  . UTI (urinary tract infection) 01/24/2017  . Confusion 01/24/2017  . Hypoxia 01/24/2017  . Bruit of right carotid artery; normal dopplers 08/08/2014  . Tobacco abuse   . Abnormal EKG 01/17/2014  . DOE (dyspnea on exertion) 01/17/2014  . Bradycardia 01/17/2014  . Essential hypertension   . Hyperlipidemia with target LDL less than 130     Past Surgical History:  Procedure Laterality Date  . ABDOMINAL HYSTERECTOMY    . APPENDECTOMY  20+ years ago  . BACK SURGERY  2 years ago   x2  . Cardiac Event Monitor  February 2015   Sinus rhythm sinus bradycardia rates of 46-80 beats a minute. No other arrhythmias; occasional PACs with some being blocked., Occasional first-degree AV block.  [place] Kitchen CATARACT EXTRACTION W/PHACO Left 01/19/2017   Procedure: CATARACT EXTRACTION PHACO AND INTRAOCULAR LENS PLACEMENT (IOC);  Surgeon: Eulogio Bear, MD;  Location: [facility] ORS;  Service: Ophthalmology;  Laterality: Left;  us00:58.3 ap%14.8 cde8.63 lot # [id number]  . CATARACT EXTRACTION W/PHACO Right 02/16/2017  Procedure: CATARACT EXTRACTION PHACO AND INTRAOCULAR LENS PLACEMENT (IOC);  Surgeon: Eulogio Bear, MD;  Location: [facility] ORS;  Service: Ophthalmology;  Laterality: Right;  [place] 1:10.9 AP% 11.0 CDE 7.84 FLUID PACK LOT # [id number]  . COLONOSCOPY WITH PROPOFOL N/A 12/20/2018   Procedure: COLONOSCOPY WITH PROPOFOL;  Surgeon: Virgel Manifold, MD;  Location: [facility] ENDOSCOPY;  Service: Endoscopy;  Laterality: N/A;  . ESOPHAGOGASTRODUODENOSCOPY N/A 12/18/2018   Procedure: ESOPHAGOGASTRODUODENOSCOPY (EGD);  Surgeon: Virgel Manifold, MD;  Location: [facility] ENDOSCOPY;  Service: Endoscopy;   Laterality: N/A;  . TRANSTHORACIC ECHOCARDIOGRAM  01/21/2014   Normal LV size and function. No regional wall motion abnormalities. Mild aortic sclerosis with trivial regurgitation.  . VESICOVAGINAL FISTULA CLOSURE W/ TAH  20+ years ago    Prior to Admission medications   Medication Sig Start Date End Date Taking? Authorizing Provider  carvedilol (COREG) 6.25 MG tablet Take 6.25 mg by mouth 2 (two) times daily.    Yes [provider]  cholecalciferol (VITAMIN D) 1000 units tablet Take 1,000 Units by mouth daily.   Yes [provider]  donepezil (ARICEPT) 10 MG tablet Take 10 mg by mouth at bedtime. 03/13/20  Yes [provider]  Omega-3 Fatty Acids (FISH OIL) 1000 MG CAPS Take 1 capsule by mouth daily.   Yes [provider]  oxybutynin (DITROPAN) 5 MG tablet Take 5 mg by mouth 2 (two) times daily. 12/26/16  Yes [provider]  pantoprazole (PROTONIX) 40 MG tablet Take 1 tablet (40 mg total) by mouth daily. 09/08/18  Yes Salary, Avel Peace, MD  pravastatin (PRAVACHOL) 40 MG tablet Take 40 mg by mouth daily.   Yes [provider]  megestrol (MEGACE) 40 MG tablet Take 40 mg by mouth daily. 03/12/20   [provider]    Allergies Penicillins and Sulfamethizole  Family History  Problem Relation Age of Onset  . Diabetes Mother   . Heart disease Sister   . Heart attack Sister   . Brain cancer Brother   . ALS Daughter     Social History Social History   Tobacco Use  . Smoking status: Former Smoker    Packs/day: 1.50    Years: 50.00    Pack years: 75.00    Quit date: 02/16/2018    Years since quitting: 2.0  . Smokeless tobacco: Never Used  Substance Use Topics  . Alcohol use: No  . Drug use: No    Review of Systems  Review of Systems  Constitutional: Positive for fatigue. Negative for chills and fever.  HENT: Negative for congestion and sore throat.   Eyes: Negative for visual disturbance.  Respiratory: Negative for  cough and shortness of breath.   Cardiovascular: Negative for chest pain.  Gastrointestinal: Negative for abdominal pain, diarrhea, nausea and vomiting.  Genitourinary: Negative for flank pain.  Musculoskeletal: Negative for back pain and neck pain.  Skin: Negative for rash and wound.  Allergic/Immunologic: Negative for immunocompromised state.  Neurological: Positive for weakness. Negative for numbness.  Hematological: Does not bruise/bleed easily.     ____________________________________________  PHYSICAL EXAM:      VITAL SIGNS: ED Triage Vitals  Enc Vitals Group     BP 03/13/20 1611 (!) 116/46     Pulse Rate 03/13/20 1611 64     Resp 03/13/20 1611 16     Temp 03/13/20 1611 97.6 F (36.4 C)     Temp Source 03/13/20 1611 Oral     SpO2 03/13/20 1611 100 %  Weight 03/13/20 1618 84 lb (38.1 kg)     Height 03/13/20 1618 4\' 11"  (1.499 m)     Head Circumference --      Peak Flow --      Pain Score --      Pain Loc --      Pain Edu? --      Excl. in [place]? --      Physical Exam Vitals and nursing note reviewed.  Constitutional:      General: She is not in acute distress.    Appearance: She is well-developed.  HENT:     Head: Normocephalic and atraumatic.     Mouth/Throat:     Mouth: Mucous membranes are dry.  Eyes:     Conjunctiva/sclera: Conjunctivae normal.  Cardiovascular:     Rate and Rhythm: Normal rate and regular rhythm.     Heart sounds: Normal heart sounds. No murmur. No friction rub.  Pulmonary:     Effort: Pulmonary effort is normal. No respiratory distress.     Breath sounds: Wheezing (faint, expiratory) present. No rales.  Abdominal:     General: There is no distension.     Palpations: Abdomen is soft.     Tenderness: There is no abdominal tenderness.  Musculoskeletal:     Cervical back: Neck supple.  Skin:    General: Skin is warm.     Capillary Refill: Capillary refill takes less than 2 seconds.     Coloration: Skin is pale.  Neurological:      Mental Status: She is alert and oriented to person, place, and time.     Motor: No abnormal muscle tone.       ____________________________________________   LABS (all labs ordered are listed, but only abnormal results are displayed)  Labs Reviewed  CBC WITH DIFFERENTIAL/PLATELET - Abnormal; Notable for the following components:      Result Value   WBC 14.9 (*)    RBC 2.08 (*)    Hemoglobin 5.2 (*)    HCT 17.7 (*)    MCH 25.0 (*)    MCHC 29.4 (*)    RDW 18.0 (*)    Neutro Abs 12.5 (*)    Abs Immature Granulocytes 0.14 (*)    All other components within normal limits  BASIC METABOLIC PANEL - Abnormal; Notable for the following components:   Sodium 134 (*)    CO2 21 (*)    Glucose, Bld 107 (*)    BUN 38 (*)    Creatinine, Ser 1.17 (*)    Calcium 8.5 (*)    GFR calc non Af Amer 42 (*)    GFR calc Af Amer 49 (*)    All other components within normal limits  HEMOGLOBIN AND HEMATOCRIT, BLOOD - Abnormal; Notable for the following components:   Hemoglobin 7.5 (*)    HCT 24.0 (*)    All other components within normal limits  SARS CORONAVIRUS 2 (TAT 6-24 HRS)  PROTIME-INR  APTT  BASIC METABOLIC PANEL  CBC  HEMOGLOBIN AND HEMATOCRIT, BLOOD  URINALYSIS, COMPLETE (UACMP) WITH MICROSCOPIC  HEMOGLOBIN AND HEMATOCRIT, BLOOD  TYPE AND SCREEN  PREPARE RBC (CROSSMATCH)    ____________________________________________  EKG: None ________________________________________  RADIOLOGY All imaging, including plain films, CT scans, and ultrasounds, independently reviewed by me, and interpretations confirmed via formal radiology reads.  ED MD interpretation:   None  Official radiology report(s): No results found.  ____________________________________________  PROCEDURES   Procedure(s) performed (including Critical Care):  .Critical Care Performed by: Ellender Hose,  Lysbeth Galas, MD Authorized by: Duffy Bruce, MD   Critical care provider statement:    Critical care time  (minutes):  35   Critical care time was exclusive of:  Separately billable procedures and treating other patients and teaching time   Critical care was necessary to treat or prevent imminent or life-threatening deterioration of the following conditions:  Cardiac failure and circulatory failure   Critical care was time spent personally by me on the following activities:  Development of treatment plan with patient or surrogate, discussions with consultants, evaluation of patient's response to treatment, examination of patient, obtaining history from patient or surrogate, ordering and performing treatments and interventions, ordering and review of laboratory studies, ordering and review of radiographic studies, pulse oximetry, re-evaluation of patient's condition and review of old charts   I assumed direction of critical care for this patient from another provider in my specialty: no   .1-3 Lead EKG Interpretation Performed by: Duffy Bruce, MD Authorized by: Duffy Bruce, MD     Interpretation: normal     ECG rate:  55-75   ECG rate assessment: bradycardic     Rhythm: sinus rhythm     Ectopy: none     Conduction: normal   Comments:     Indication: Severe anemia receiving transfusions    ____________________________________________  INITIAL IMPRESSION / MDM / ASSESSMENT AND PLAN / ED COURSE  As part of my medical decision making, I reviewed the following data within the [place] notes reviewed and incorporated, Old chart reviewed, Notes from prior ED visits, and [place] Controlled Substance Database       *[name] was evaluated in Emergency Department on 03/14/2020 for the symptoms described in the history of present illness. She was evaluated in the context of the global COVID-19 pandemic, which necessitated consideration that the patient might be at risk for infection with the SARS-CoV-2 virus that causes COVID-19. Institutional protocols and algorithms  that pertain to the evaluation of patients at risk for COVID-19 are in a state of rapid change based on information released by regulatory bodies including the CDC and federal and state organizations. These policies and algorithms were followed during the patient's care in the ED.  Some ED evaluations and interventions may be delayed as a result of limited staffing during the pandemic.*     Medical Decision Making:  86 yo F here with mild generalized weakness. Lab work shows significant acute on chronic anemia. Pt has been having active melena with h/o frequent NSAID use. She also has a known colon CA, so suspect GI losses. Will treat with protonix, transfusion, and admit. Family would prefer minimal aggressive interventions so suspect she can be managed conservatively.  ____________________________________________  FINAL CLINICAL IMPRESSION(S) / ED DIAGNOSES  Final diagnoses:  Symptomatic anemia  Melena     MEDICATIONS GIVEN DURING THIS VISIT:  Medications  levalbuterol (XOPENEX) nebulizer solution 0.63 mg (0.63 mg Nebulization Not Given 03/13/20 2015)  carvedilol (COREG) tablet 6.25 mg (6.25 mg Oral Not Given 03/14/20 0116)  pravastatin (PRAVACHOL) tablet 40 mg (has no administration in time range)  donepezil (ARICEPT) tablet 10 mg (10 mg Oral Not Given 03/14/20 0116)  oxybutynin (DITROPAN) tablet 5 mg (5 mg Oral Not Given 03/14/20 0118)  0.9 %  sodium chloride infusion ( Intravenous Rate/Dose Verify 03/14/20 0658)  acetaminophen (TYLENOL) tablet 650 mg (has no administration in time range)    Or  acetaminophen (TYLENOL) suppository 650 mg (has no administration in time range)  traZODone (DESYREL) tablet 25 mg (has no administration in time range)  ondansetron (ZOFRAN) tablet 4 mg (has no administration in time range)    Or  ondansetron (ZOFRAN) injection 4 mg (has no administration in time range)  pantoprazole (PROTONIX) 80 mg in sodium chloride 0.9 % 100 mL (0.8 mg/mL) infusion (8  mg/hr Intravenous Rate/Dose Verify 03/14/20 0658)  pantoprazole (PROTONIX) injection 40 mg (has no administration in time range)  0.9 %  sodium chloride infusion (0 mL/hr Intravenous Stopped 03/14/20 0115)  pantoprazole (PROTONIX) 80 mg in sodium chloride 0.9 % 100 mL IVPB (0 mg Intravenous Stopped 03/14/20 0119)     ED Discharge Orders    None       Note:  This document was prepared using Dragon voice recognition software and may include unintentional dictation errors.   Duffy Bruce, MD 03/14/20 [phone]

## 2020-03-13 NOTE — ED Notes (Addendum)
POA daughter at bedside with pt. History of mass and dementia. Black tary stools recently per daughter. Also has been taking iron pills for anemia. Pt alert and calmly laying in bed. Pt reports she is cold; given more warm blankets. Pt denies pain/nausea. Wheezing audible without stethoscope. Resp reg/unlabored.

## 2020-03-13 NOTE — ED Notes (Signed)
Pt in NAD. Pt denies any s/s of reaction to blood transfusion.

## 2020-03-13 NOTE — ED Notes (Signed)
NT picking up unit of blood now.

## 2020-03-13 NOTE — ED Notes (Signed)
Daughter Sam notified pt moving to room 225.

## 2020-03-14 DIAGNOSIS — D649 Anemia, unspecified: Secondary | ICD-10-CM

## 2020-03-14 LAB — HEMOGLOBIN AND HEMATOCRIT, BLOOD
HCT: 24 % — ABNORMAL LOW (ref 36.0–46.0)
Hemoglobin: 7.5 g/dL — ABNORMAL LOW (ref 12.0–15.0)

## 2020-03-14 LAB — SARS CORONAVIRUS 2 (TAT 6-24 HRS): SARS Coronavirus 2: NEGATIVE

## 2020-03-14 LAB — PROTIME-INR
INR: 1.2 (ref 0.8–1.2)
Prothrombin Time: 14.9 seconds (ref 11.4–15.2)

## 2020-03-14 LAB — APTT: aPTT: 31 seconds (ref 24–36)

## 2020-03-14 MED ORDER — LORAZEPAM 0.5 MG PO TABS: 0.5000 mg | ORAL_TABLET | Freq: Four times a day (QID) | ORAL | Status: DC | PRN

## 2020-03-14 MED ORDER — MORPHINE SULFATE (CONCENTRATE) 10 MG/0.5ML PO SOLN: 10.0000 mg | ORAL | Status: DC | PRN

## 2020-03-14 MED ORDER — LORAZEPAM 0.5 MG PO TABS: 0.5000 mg | ORAL_TABLET | Freq: Three times a day (TID) | ORAL | 0 refills | Status: AC | PRN

## 2020-03-14 MED ORDER — PANTOPRAZOLE SODIUM 40 MG PO TBEC
40.0000 mg | DELAYED_RELEASE_TABLET | Freq: Two times a day (BID) | ORAL | Status: DC
  Administered 2020-03-14: 40 mg via ORAL
  Filled 2020-03-14: qty 1

## 2020-03-14 MED ORDER — MORPHINE SULFATE (CONCENTRATE) 10 MG/0.5ML PO SOLN: 10.0000 mg | ORAL | 0 refills | Status: AC | PRN

## 2020-03-14 MED ORDER — PANTOPRAZOLE SODIUM 40 MG PO TBEC: 40.0000 mg | DELAYED_RELEASE_TABLET | Freq: Two times a day (BID) | ORAL | 0 refills | Status: AC

## 2020-03-14 NOTE — TOC Initial Note (Signed)
Transition of Care [facility]) - Initial/Assessment Note    Patient Details  Name: [name] MRN: [id number] Date of Birth: Apr 10, 1933  [facility]) CM/SW Contact:    Eileen Stanford, LCSW Phone Number: 03/14/2020, 10:47 AM  Clinical Narrative:     CSW received a message from MD stating pt will d/c home with hospice services. CSW called pt's daughter and offered choice. Pt's daughter is agreeable to [facility]. Pt's daughter asking if representative for [facility] could call her and explain how home with hospice services works. CSW contacted April with [facility] and she took referral for review. April also took pt's daughter name and number and will contact her.               Expected Discharge Plan: Home w Hospice Care Barriers to Discharge: No Barriers Identified   Patient Goals and CMS Choice     Choice offered to / list presented to : Adult Children  Expected Discharge Plan and Services Expected Discharge Plan: Home w Hospice Care In-house Referral: Clinical Social Work     Living arrangements for the past 2 months: [place]: (Hospice) [place]: [facility]) Date [place]: 03/14/20 Time [place]: 47 Representative spoke with at [place]: April  Prior Living Arrangements/Services Living arrangements for the past 2 months: [place] with:: Adult Children Patient language and need for interpreter reviewed:: Yes Do you feel safe going back to the place where you live?: Yes      Need for Family Participation in Patient Care: Yes (Comment) Care giver support system in place?: Yes (comment) Current home services: DME Criminal Activity/Legal Involvement Pertinent to Current Situation/Hospitalization: No - Comment as needed  Activities of Daily Living   ADL Screening (condition at time of admission) Patient's cognitive ability adequate to  safely complete daily activities?: No Is the patient deaf or have difficulty hearing?: No Does the patient have difficulty seeing, even when wearing glasses/contacts?: No Does the patient have difficulty concentrating, remembering, or making decisions?: Yes Patient able to express need for assistance with ADLs?: No Does the patient have difficulty dressing or bathing?: Yes Independently performs ADLs?: No Communication: Independent Feeding: Needs assistance  Permission Sought/Granted Permission sought to share information with : Family Supports    Share Information with NAME: Anderson Malta  Permission granted to share info w AGENCY: [facility]  Permission granted to share info w Relationship: daughter  Permission granted to share info w Contact Information: [phone]  Emotional Assessment Appearance:: Appears stated age Attitude/Demeanor/Rapport: Unable to Assess Affect (typically observed): Unable to Assess Orientation: : Oriented to Self Alcohol / Substance Use: Not Applicable Psych Involvement: No (comment)  Admission diagnosis:  GI bleeding [K92.2] Patient Active Problem List   Diagnosis Date Noted  . GI bleeding 03/13/2020  . Benign neoplasm of cecum   . Polyp of sigmoid colon   . Diverticulosis of large intestine without diverticulitis   . Neoplasm of digestive system   . Iron deficiency anemia   . Gastrointestinal hemorrhage   . Upper GI bleed 12/15/2018  . COPD exacerbation ([place]) 09/05/2018  . Hypomagnesemia 01/25/2017  . Hyponatremia 01/24/2017  . Hypokalemia 01/24/2017  . UTI (urinary tract infection) 01/24/2017  . Confusion 01/24/2017  . Hypoxia 01/24/2017  . Bruit of right carotid artery; normal dopplers 08/08/2014  .  Tobacco abuse   . Abnormal EKG 01/17/2014  . DOE (dyspnea on exertion) 01/17/2014  . Bradycardia 01/17/2014  . Essential hypertension   . Hyperlipidemia with target LDL less than 130    PCP:  [name], MD Pharmacy:   [facility] [address] [place] [address] [place] [address] Phone: [phone] Fax: [phone]     Social Determinants of Health (SDOH) Interventions    Readmission Risk Interventions No flowsheet data found.

## 2020-03-14 NOTE — Progress Notes (Signed)
[name] and [name]. VSS. Pt tolerating diet well. No complaints of nausea or vomiting. IV removed intact, prescriptions given. Pt daughter voices understanding of discharge instructions with no further questions. Patient discharged via wheelchair with RN  Allergies as of 03/14/2020      Reactions   Penicillins Other (See Comments)   Patient unsure of what reaction was. Has patient had [name] PCN reaction causing immediate rash, facial/tongue/throat swelling, SOB or lightheadedness with hypotension: unknown Has patient had [name] PCN reaction causing severe rash involving mucus membranes or skin necrosis: uknown Has patient had [name] PCN reaction that required hospitalization: unknown Has patient had [name] PCN reaction occurring within the last 10 years: No If all of the above answers are "NO", then may proceed with Cephalosporin use.   Sulfamethizole Other (See Comments)   Unknown reaction. Last used long time ago. Quit taking,.      Medication List    STOP taking these medications   megestrol 40 MG tablet Commonly known as: MEGACE   pravastatin 40 MG tablet Commonly known as: PRAVACHOL     TAKE these medications   carvedilol 6.25 MG tablet Commonly known as: COREG Take 6.25 mg by mouth 2 (two) times daily.   cholecalciferol 1000 units tablet Commonly known as: VITAMIN D Take 1,000 Units by mouth daily.   donepezil 10 MG tablet Commonly known as: ARICEPT Take 10 mg by mouth at bedtime.   Fish Oil 1000 MG Caps Take 1 capsule by mouth daily.   LORazepam 0.5 MG tablet Commonly known as: ATIVAN Take 1 tablet (0.5 mg total) by mouth every 8 (eight) hours as needed for anxiety.   morphine CONCENTRATE 10 MG/0.5ML Soln concentrated solution Take 0.5 mLs (10 mg total) by mouth every 2 (two) hours as needed for severe pain or shortness of breath.   oxybutynin 5 MG tablet Commonly known as: DITROPAN Take 5 mg by mouth 2 (two) times daily.   pantoprazole 40 MG tablet Commonly known as:  PROTONIX Take 1 tablet (40 mg total) by mouth 2 (two) times daily before [name] meal. What changed: when to take this       Vitals:   03/14/20 0639 03/14/20 1025  BP: 125/61 (!) 125/48  Pulse: (!) 59   Resp: 16   Temp: 97.8 F (36.6 C)   SpO2: 100%     [name] C [name]

## 2020-03-14 NOTE — Discharge Summary (Signed)
[facility] Discharge Summary   Patient: [name] [id number]  PCP: [name], MD  Date of admission: 03/13/2020   Date of discharge: 03/14/2020      Discharge Diagnoses:  Principal diagnosis Upper GI bleeding. Colon cancer  Active Problems:   GI bleeding   Admitted From: Home Disposition:  Home home hospice  Recommendations for Outpatient Follow-up:  1. PCP: Follow-up with PCP and establish care with hospice 2. Follow up LABS/TEST: None  Follow-up Information    [name], MD. Schedule an appointment as soon as possible for a visit in 1 week(s).   Specialty: Family Medicine Contact information: [place] [id number] [phone]          Diet recommendation: Cardiac diet  Activity: The patient is advised to gradually reintroduce usual activities, as tolerated  Discharge Condition: stable  Code Status: DNR   History of present illness: As per the H and P dictated on admission, "[name]  is a 86 y.o. Caucasian female with a known history of COPD, hypertension, colon cancer, dyslipidemia, proximal atrial fibrillation, tobacco abuse and peptic ulcer disease, who presented to the emergency room with acute onset of increasing weakness and fatigue over the last several weeks with associated melanotic stools.  This was thought to be related to iron supplement but outpatient labs were drawn and she was noted to have severe anemia for which she came to the ER.  The patient denies any nausea or vomiting or bright red bleeding per rectum. The patient takes [facility] powder frequently, about twice a day on a regular basis and has been doing so for a long time.   No hematemesis or jaundice.  She admits to dyspnea and wheezing with her COPD.  She has been having dyschezia.  She denies any abdominal pain.  No dysuria, oliguria or hematuria or flank pain.  No other bleeding diathesis.  She has chosen to have no therapy for her colon cancer and  currently this procedures including upper and lower GI endoscopy but is agreeable with blood transfusion."  Hospital Course:  Patient presented with complaints of abnormal lab.  Family reported black tarry stool at home.  Patient was transfused 3 PRBC. family was clearly do not want to pursue any intervention.  Patient has known history of adenocarcinoma of the colon for which patient has refused any intervention or treatment which could also be source of patient's chronic bleeding. Patient was transitioned to hospice at home on discharge.  Summary of her active problems in the hospital is as following.   1.  GI bleeding likely of upper GI etiology with associated melena, acute blood loss anemia. This likely secondary to possible gastritis or duodenitis, gastric or duodenal erosions that could be secondary to excessive amount of aspirin and Goody powders. Admitted in the hospital. Aspirin 3 PRBC transfusion.  Trending H&H came up appropriately. No further bleeding reported in the hospital. Patient remains hemodynamically stable. Family and the patient refused any further intervention. Address goals of care with the patient and patient was transitioned to hospice at the time of the discharge. Protonix twice daily added. GI consultation was canceled.  2.  Mild acute kidney injury. Stable.  Monitor outpatient blood Now comfort care.  3.  Leukocytosis. Likely stress demargination.  4.  Essential hypertension. -We will continue Coreg.  5.  COPD with no current exacerbation. Continue home regimen.  6.  Cognitive dysfunction/dementia. -We will continue Aricept.  7.  Overactive bladder. -We will continue Ditropan.  8.  Dyslipidemia. Since the patient is on hospice and prognosis remains guarded regardless, discontinue statin.  9.  Adenocarcinoma of the colon. Patient had been diagnosed with adenocarcinoma of the colon in January 2020. Admitted point patient has decided not to  pursue any further intervention or procedures for the adenocarcinoma. This can be the source of the patient's chronic blood loss as well. Family and patient does not want to pursue any further interventions currently as well. Transition to home hospice on discharge.   10 underweight Severe malnutrition  Body mass index is 16.97 kg/m.   Pain control  - [employer] Controlled Substance Reporting System database was reviewed. -Patient is going to be a hospice patient on discharge.  On the day of the discharge the patient's vitals were stable, and no other acute medical condition were reported by patient. the patient was felt safe to be discharge at Home with hospice.  Consultants: none Procedures: none  Discharge Exam: General: Appear in mild distress, no Rash; Oral Mucosa Clear, moist. Cardiovascular: S1 and S2 Present, aortic systolic  Murmur, Respiratory: normal respiratory effort, Bilateral Air entry present and no Crackles, no wheezes Abdomen: Bowel Sound present, Soft and mild tenderness, no hernia Extremities: no Pedal edema, no calf tenderness Neurology: alert and oriented to time, place, and person affect appropriate.  Filed Weights   03/13/20 1618  Weight: 38.1 kg   Vitals:   03/14/20 0639 03/14/20 1025  BP: 125/61 (!) 125/48  Pulse: (!) 59   Resp: 16   Temp: 97.8 F (36.6 C)   SpO2: 100%     DISCHARGE MEDICATION: Allergies as of 03/14/2020      Reactions   Penicillins Other (See Comments)   Patient unsure of what reaction was. Has patient had a PCN reaction causing immediate rash, facial/tongue/throat swelling, SOB or lightheadedness with hypotension: unknown Has patient had a PCN reaction causing severe rash involving mucus membranes or skin necrosis: uknown Has patient had a PCN reaction that required hospitalization: unknown Has patient had a PCN reaction occurring within the last 10 years: No If all of the above answers are "NO", then may proceed with  Cephalosporin use.   Sulfamethizole Other (See Comments)   Unknown reaction. Last used long time ago. Quit taking,.      Medication List    STOP taking these medications   megestrol 40 MG tablet Commonly known as: MEGACE   pravastatin 40 MG tablet Commonly known as: PRAVACHOL     TAKE these medications   carvedilol 6.25 MG tablet Commonly known as: COREG Take 6.25 mg by mouth 2 (two) times daily.   cholecalciferol 1000 units tablet Commonly known as: VITAMIN D Take 1,000 Units by mouth daily.   donepezil 10 MG tablet Commonly known as: ARICEPT Take 10 mg by mouth at bedtime.   Fish Oil 1000 MG Caps Take 1 capsule by mouth daily.   LORazepam 0.5 MG tablet Commonly known as: ATIVAN Take 1 tablet (0.5 mg total) by mouth every 8 (eight) hours as needed for anxiety.   morphine CONCENTRATE 10 MG/0.5ML Soln concentrated solution Take 0.5 mLs (10 mg total) by mouth every 2 (two) hours as needed for severe pain or shortness of breath.   oxybutynin 5 MG tablet Commonly known as: DITROPAN Take 5 mg by mouth 2 (two) times daily.   pantoprazole 40 MG tablet Commonly known as: PROTONIX Take 1 tablet (40 mg total) by mouth 2 (two) times daily before a meal. What changed: when to take  this      Allergies  Allergen Reactions  . Penicillins Other (See Comments)    Patient unsure of what reaction was. Has patient had a PCN reaction causing immediate rash, facial/tongue/throat swelling, SOB or lightheadedness with hypotension: unknown Has patient had a PCN reaction causing severe rash involving mucus membranes or skin necrosis: uknown Has patient had a PCN reaction that required hospitalization: unknown Has patient had a PCN reaction occurring within the last 10 years: No If all of the above answers are "NO", then may proceed with Cephalosporin use.   Francine Graven Other (See Comments)    Unknown reaction. Last used long time ago. Quit taking,.   Discharge Instructions     Diet - low sodium heart healthy   Complete by: As directed    Increase activity slowly   Complete by: As directed       The results of significant diagnostics from this hospitalization (including imaging, microbiology, ancillary and laboratory) are listed below for reference.    Significant Diagnostic Studies: No results found.  Microbiology: Recent Results (from the past 240 hour(s))  SARS CORONAVIRUS 2 (TAT 6-24 HRS) Nasopharyngeal Nasopharyngeal Swab     Status: None   Collection Time: 03/13/20 10:02 PM   Specimen: Nasopharyngeal Swab  Result Value Ref Range Status   SARS Coronavirus 2 NEGATIVE NEGATIVE Final    Comment: (NOTE) SARS-CoV-2 target nucleic acids are NOT DETECTED. The SARS-CoV-2 RNA is generally detectable in upper and lower respiratory specimens during the acute phase of infection. Negative results do not preclude SARS-CoV-2 infection, do not rule out co-infections with other pathogens, and should not be used as the sole basis for treatment or other patient management decisions. Negative results must be combined with clinical observations, patient history, and epidemiological information. The expected result is Negative. Fact Sheet for Patients: SugarRoll[employer]be Fact Sheet for Healthcare Providers: https://www.woods-mathews.com/ This test is not yet approved or cleared by the [place] FDA and  has been authorized for detection and/or diagnosis of SARS-CoV-2 by FDA under an Emergency Use Authorization (EUA). This EUA will remain  in effect (meaning this test can be used) for the duration of the COVID-19 declaration under Section 56 4(b)(1) of the Act, 21 U.S.C. section 360bbb-3(b)(1), unless the authorization is terminated or revoked sooner. Performed at [facility], [place] [address] [place], [place] [address]      Labs: CBC: Recent Labs  Lab 03/13/20 1631 03/14/20 0033  WBC 14.9*  --   NEUTROABS  12.5*  --   HGB 5.2* 7.5*  HCT 17.7* 24.0*  MCV 85.1  --   PLT 264  --    Basic Metabolic Panel: Recent Labs  Lab 03/13/20 1631  NA 134*  K 4.3  CL 102  CO2 21*  GLUCOSE 107*  BUN 38*  CREATININE 1.17*  CALCIUM 8.5*   Liver Function Tests: No results for input(s): AST, ALT, ALKPHOS, BILITOT, PROT, ALBUMIN in the last 168 hours. No results for input(s): LIPASE, AMYLASE in the last 168 hours. No results for input(s): AMMONIA in the last 168 hours. Cardiac Enzymes: No results for input(s): CKTOTAL, CKMB, CKMBINDEX, TROPONINI in the last 168 hours. BNP (last 3 results) No results for input(s): BNP in the last 8760 hours. CBG: No results for input(s): GLUCAP in the last 168 hours.  Time spent: 35 minutes  Signed:  Berle Mull  [facility] 03/14/2020 7:04 PM

## 2020-03-15 LAB — TYPE AND SCREEN
ABO/RH(D): O POS
Antibody Screen: NEGATIVE
Unit division: 0
Unit division: 0
Unit division: 0

## 2020-03-15 LAB — BPAM RBC
Blood Product Expiration Date: 202103292359
Blood Product Expiration Date: 202104272359
Blood Product Expiration Date: 202104272359
ISSUE DATE / TIME: 202103262055
ISSUE DATE / TIME: 202103270048
ISSUE DATE / TIME: 202103270413
Unit Type and Rh: 5100
Unit Type and Rh: 5100
Unit Type and Rh: 9500

## 2020-04-18 DEATH — deceased
